# Patient Record
Sex: Female | Born: 1940 | Race: Black or African American | Hispanic: No | Smoking: Former smoker
Health system: Southern US, Community
[De-identification: ages and names within clinical notes are randomized; demographics above are authoritative.]

## PROBLEM LIST (undated history)

## (undated) DIAGNOSIS — D509 Iron deficiency anemia, unspecified: Secondary | ICD-10-CM

## (undated) DIAGNOSIS — I10 Essential (primary) hypertension: Secondary | ICD-10-CM

## (undated) DIAGNOSIS — R0789 Other chest pain: Secondary | ICD-10-CM

## (undated) DIAGNOSIS — R0989 Other specified symptoms and signs involving the circulatory and respiratory systems: Secondary | ICD-10-CM

## (undated) DIAGNOSIS — E785 Hyperlipidemia, unspecified: Secondary | ICD-10-CM

## (undated) DIAGNOSIS — R42 Dizziness and giddiness: Secondary | ICD-10-CM

## (undated) DIAGNOSIS — Z9289 Personal history of other medical treatment: Secondary | ICD-10-CM

## (undated) HISTORY — PX: ABDOMINAL HYSTERECTOMY: SHX81

## (undated) HISTORY — DX: Other specified symptoms and signs involving the circulatory and respiratory systems: R09.89

## (undated) HISTORY — PX: GASTRIC RESTRICTION SURGERY: SHX653

## (undated) HISTORY — PX: SHOULDER SURGERY: SHX246

## (undated) HISTORY — PX: TUBAL LIGATION: SHX77

## (undated) HISTORY — DX: Other chest pain: R07.89

## (undated) HISTORY — DX: Essential (primary) hypertension: I10

## (undated) HISTORY — PX: TONSILLECTOMY: SUR1361

## (undated) HISTORY — DX: Hyperlipidemia, unspecified: E78.5

## (undated) HISTORY — DX: Dizziness and giddiness: R42

---

## 2003-06-10 ENCOUNTER — Inpatient Hospital Stay (HOSPITAL_COMMUNITY): Admission: RE | Admit: 2003-06-10 | Discharge: 2003-06-11 | Payer: Self-pay | Admitting: Orthopedic Surgery

## 2010-05-16 ENCOUNTER — Encounter (INDEPENDENT_AMBULATORY_CARE_PROVIDER_SITE_OTHER): Payer: Self-pay | Admitting: Surgery

## 2010-05-16 ENCOUNTER — Inpatient Hospital Stay (HOSPITAL_COMMUNITY)
Admission: RE | Admit: 2010-05-16 | Discharge: 2010-05-23 | Payer: Self-pay | Source: Home / Self Care | Attending: Surgery | Admitting: Surgery

## 2010-08-20 LAB — COMPREHENSIVE METABOLIC PANEL
ALT: 11 U/L (ref 0–35)
ALT: 11 U/L (ref 0–35)
ALT: 12 U/L (ref 0–35)
ALT: 13 U/L (ref 0–35)
AST: 15 U/L (ref 0–37)
AST: 18 U/L (ref 0–37)
AST: 19 U/L (ref 0–37)
AST: 19 U/L (ref 0–37)
Albumin: 2.6 g/dL — ABNORMAL LOW (ref 3.5–5.2)
Albumin: 2.7 g/dL — ABNORMAL LOW (ref 3.5–5.2)
Albumin: 2.8 g/dL — ABNORMAL LOW (ref 3.5–5.2)
Albumin: 3.4 g/dL — ABNORMAL LOW (ref 3.5–5.2)
Alkaline Phosphatase: 41 U/L (ref 39–117)
Alkaline Phosphatase: 43 U/L (ref 39–117)
Alkaline Phosphatase: 47 U/L (ref 39–117)
Alkaline Phosphatase: 58 U/L (ref 39–117)
BUN: 2 mg/dL — ABNORMAL LOW (ref 6–23)
BUN: 7 mg/dL (ref 6–23)
BUN: 8 mg/dL (ref 6–23)
BUN: 8 mg/dL (ref 6–23)
CO2: 29 mEq/L (ref 19–32)
CO2: 30 mEq/L (ref 19–32)
CO2: 33 mEq/L — ABNORMAL HIGH (ref 19–32)
CO2: 35 mEq/L — ABNORMAL HIGH (ref 19–32)
Calcium: 8.4 mg/dL (ref 8.4–10.5)
Calcium: 8.6 mg/dL (ref 8.4–10.5)
Calcium: 8.8 mg/dL (ref 8.4–10.5)
Calcium: 9.2 mg/dL (ref 8.4–10.5)
Chloride: 100 mEq/L (ref 96–112)
Chloride: 104 mEq/L (ref 96–112)
Chloride: 104 mEq/L (ref 96–112)
Chloride: 97 mEq/L (ref 96–112)
Creatinine, Ser: 0.79 mg/dL (ref 0.4–1.2)
Creatinine, Ser: 0.81 mg/dL (ref 0.4–1.2)
Creatinine, Ser: 0.94 mg/dL (ref 0.4–1.2)
Creatinine, Ser: 0.96 mg/dL (ref 0.4–1.2)
GFR calc Af Amer: >60 mL/min (ref >60)
GFR calc Af Amer: >60 mL/min (ref >60)
GFR calc Af Amer: >60 mL/min (ref >60)
GFR calc Af Amer: >60 mL/min (ref >60)
GFR calc non Af Amer: 58 mL/min — ABNORMAL LOW (ref >60)
GFR calc non Af Amer: 59 mL/min — ABNORMAL LOW (ref >60)
GFR calc non Af Amer: >60 mL/min (ref >60)
GFR calc non Af Amer: >60 mL/min (ref >60)
Glucose, Bld: 136 mg/dL — ABNORMAL HIGH (ref 70–99)
Glucose, Bld: 146 mg/dL — ABNORMAL HIGH (ref 70–99)
Glucose, Bld: 156 mg/dL — ABNORMAL HIGH (ref 70–99)
Glucose, Bld: 96 mg/dL (ref 70–99)
Potassium: 3.7 mEq/L (ref 3.5–5.1)
Potassium: 4.2 mEq/L (ref 3.5–5.1)
Potassium: 4.3 mEq/L (ref 3.5–5.1)
Potassium: 4.5 mEq/L (ref 3.5–5.1)
Sodium: 138 mEq/L (ref 135–145)
Sodium: 140 mEq/L (ref 135–145)
Sodium: 140 mEq/L (ref 135–145)
Sodium: 141 mEq/L (ref 135–145)
Total Bilirubin: 0.5 mg/dL (ref 0.3–1.2)
Total Bilirubin: 0.5 mg/dL (ref 0.3–1.2)
Total Bilirubin: 0.5 mg/dL (ref 0.3–1.2)
Total Bilirubin: 0.8 mg/dL (ref 0.3–1.2)
Total Protein: 5.7 g/dL — ABNORMAL LOW (ref 6.0–8.3)
Total Protein: 5.8 g/dL — ABNORMAL LOW (ref 6.0–8.3)
Total Protein: 5.8 g/dL — ABNORMAL LOW (ref 6.0–8.3)
Total Protein: 6.9 g/dL (ref 6.0–8.3)

## 2010-08-20 LAB — DIFFERENTIAL
Basophils Absolute: 0 10*3/uL (ref 0.0–0.1)
Basophils Relative: 0 % (ref 0–1)
Eosinophils Absolute: 0.1 10*3/uL (ref 0.0–0.7)
Eosinophils Relative: 2 % (ref 0–5)
Lymphocytes Relative: 40 % (ref 12–46)
Lymphs Abs: 2.9 10*3/uL (ref 0.7–4.0)
Monocytes Absolute: 0.7 10*3/uL (ref 0.1–1.0)
Monocytes Relative: 9 % (ref 3–12)
Neutro Abs: 3.5 10*3/uL (ref 1.7–7.7)
Neutrophils Relative %: 49 % (ref 43–77)

## 2010-08-20 LAB — CBC
HCT: 27.4 % — ABNORMAL LOW (ref 36.0–46.0)
HCT: 28 % — ABNORMAL LOW (ref 36.0–46.0)
HCT: 29.1 % — ABNORMAL LOW (ref 36.0–46.0)
HCT: 30.6 % — ABNORMAL LOW (ref 36.0–46.0)
HCT: 34.8 % — ABNORMAL LOW (ref 36.0–46.0)
Hemoglobin: 11 g/dL — ABNORMAL LOW (ref 12.0–15.0)
Hemoglobin: 8.4 g/dL — ABNORMAL LOW (ref 12.0–15.0)
Hemoglobin: 8.4 g/dL — ABNORMAL LOW (ref 12.0–15.0)
Hemoglobin: 8.8 g/dL — ABNORMAL LOW (ref 12.0–15.0)
Hemoglobin: 9.4 g/dL — ABNORMAL LOW (ref 12.0–15.0)
MCH: 26.9 pg (ref 26.0–34.0)
MCH: 27 pg (ref 26.0–34.0)
MCH: 27.1 pg (ref 26.0–34.0)
MCH: 27.1 pg (ref 26.0–34.0)
MCH: 27.4 pg (ref 26.0–34.0)
MCHC: 30 g/dL (ref 30.0–36.0)
MCHC: 30.2 g/dL (ref 30.0–36.0)
MCHC: 30.7 g/dL (ref 30.0–36.0)
MCHC: 30.7 g/dL (ref 30.0–36.0)
MCHC: 31.6 g/dL (ref 30.0–36.0)
MCV: 85.7 fL (ref 78.0–100.0)
MCV: 87.4 fL (ref 78.0–100.0)
MCV: 88.4 fL (ref 78.0–100.0)
MCV: 90 fL (ref 78.0–100.0)
MCV: 90.7 fL (ref 78.0–100.0)
Platelets: 241 10*3/uL (ref 150–400)
Platelets: 245 10*3/uL (ref 150–400)
Platelets: 257 10*3/uL (ref 150–400)
Platelets: 306 10*3/uL (ref 150–400)
Platelets: 326 10*3/uL (ref 150–400)
RBC: 3.1 MIL/uL — ABNORMAL LOW (ref 3.87–5.11)
RBC: 3.11 MIL/uL — ABNORMAL LOW (ref 3.87–5.11)
RBC: 3.21 MIL/uL — ABNORMAL LOW (ref 3.87–5.11)
RBC: 3.5 MIL/uL — ABNORMAL LOW (ref 3.87–5.11)
RBC: 4.06 MIL/uL (ref 3.87–5.11)
RDW: 13.3 % (ref 11.5–15.5)
RDW: 13.4 % (ref 11.5–15.5)
RDW: 13.5 % (ref 11.5–15.5)
RDW: 13.5 % (ref 11.5–15.5)
RDW: 13.7 % (ref 11.5–15.5)
WBC: 10.6 10*3/uL — ABNORMAL HIGH (ref 4.0–10.5)
WBC: 11.6 10*3/uL — ABNORMAL HIGH (ref 4.0–10.5)
WBC: 15.6 10*3/uL — ABNORMAL HIGH (ref 4.0–10.5)
WBC: 7.3 10*3/uL (ref 4.0–10.5)
WBC: 7.6 10*3/uL (ref 4.0–10.5)

## 2010-08-20 LAB — BASIC METABOLIC PANEL
BUN: 3 mg/dL — ABNORMAL LOW (ref 6–23)
CO2: 35 mEq/L — ABNORMAL HIGH (ref 19–32)
Calcium: 8.9 mg/dL (ref 8.4–10.5)
Chloride: 101 mEq/L (ref 96–112)
Creatinine, Ser: 0.92 mg/dL (ref 0.4–1.2)
GFR calc Af Amer: >60 mL/min (ref >60)
GFR calc non Af Amer: >60 mL/min (ref >60)
Glucose, Bld: 147 mg/dL — ABNORMAL HIGH (ref 70–99)
Potassium: 3.4 mEq/L — ABNORMAL LOW (ref 3.5–5.1)
Sodium: 143 mEq/L (ref 135–145)

## 2010-08-20 LAB — SURGICAL PCR SCREEN
MRSA, PCR: NEGATIVE
Staphylococcus aureus: NEGATIVE

## 2010-10-26 NOTE — Op Note (Signed)
NAMEMarland Kitchen  BAYLEN, DEA                        ACCOUNT NO.:  [id number]   MEDICAL RECORD NO.:  [id number]                   PATIENT TYPE:  AMB   LOCATION:  DAY                                  FACILITY:  [facility]   PHYSICIAN:  Ollen Gross, M.D.                 DATE OF BIRTH:  July 30, 1940   DATE OF PROCEDURE:  06/09/2003  DATE OF DISCHARGE:                                 OPERATIVE REPORT   PREOPERATIVE DIAGNOSIS:  Right shoulder impingement, acromioclavicular  arthrosis versus rotator cuff tear.   POSTOPERATIVE DIAGNOSIS:  Right shoulder impingement, acromioclavicular  arthrosis versus rotator cuff tear.   OPERATION/PROCEDURE:  1. Right shoulder open subacromial decompression.  2. Distal clavicle resection.  3. Rotator cuff repair.   SURGEON:  Ollen Gross, M.D.   ASSISTANT:  Alexzandrew L. Perkins, P.A.-C.   ANESTHESIA:  Interscalene and general.   ESTIMATED BLOOD LOSS:  Minimal.   DRAINS:  None.   COMPLICATIONS:  None.   DISPOSITION:  Condition stable to recovery.   BRIEF CLINICAL NOTE:  Ashley Ritter is a 70 year old female with a two to  three month history of progressively worsening right shoulder pain and  dysfunction.  Exam and history are consistent with a rotator cuff tear  confirmed by MRI.  She presents now for the above-mentioned procedure.   DESCRIPTION OF PROCEDURE:  After successful administration of interscalene  block and general anesthetic, the patient's right upper extremity and  shoulder girdle isolated from her trunk with plastic drapes and prepped and  draped in the usual sterile fashion.  Bony landmarks are marked and incision  line drawn along the skin lines at the mid acromial level from anterior to  posterior, about a 4 cm incision.  Skin was cut with #10 blade through the  subcutaneous tissue to the level of the deltoid fascia.  Subcutaneous flaps  are elevated.  The periosteum of the distal clavicle was then split  longitudinally and elevated  anterior and posterior.  We carried that out  laterally, across the anterior border of the acromion and then also split  between deltoid fibers, and into the mid deltoid for about 2 cm.  The entire  anterior soft tissue sleeve was subperiosteally elevated.  On the clavicle,  we did posterior elevation but not otherwise.  The [facility] joint was identified  and it is very stenotic with large spurs.  Anterior and posterior retractors  were placed  against the distal clavicle and about 1 cm of distal clavicle  is removed.  This effectively decompressed that area.  She had a type 2  acromion and then the oscillating saw was used to do the acromioplasty and  created a flat undersurface of the acromion.  She had a highly inflamed  bursa and all the bursa was removed.  There is a massive rotator cuff tear  involving supraspinatus, infraspinatus and ___________.  She had a  tremendous amount of  adhesions between the bursa and the rotator cuff.  I  removed the intractable _________ adhesions.  I was able to advance to the  great tuberosity up to about 60 degrees.  We created a trough at the level  of the tuberosity, placed two Myotec anchors and put the suture into the  free edges of the tendon and advanced it down through the trough.  It was  then oversewn and it was found to be a nice stable repair.  I was able to  bring her arm back down to her side and it did not disrupt the repair.  Once  this was completed, we irrigated the subacromial space with saline and  reattached the deltoid to the acromion through drill holes with #1 Ethibond.  The fascia where the distal clavicle was also closed and imbricated with the  Ethibond, the deltoid splits closed with interrupted 2-0 Vicryl.  Subcutaneous tissue closed with interrupted 2-0 Vicryl and subcuticular  running 4-0 Monocryl.  The patient was cleaned and dried and Steri-Strips  and a bulky sterile dressing applied.  She was placed into an abduction  sling,  awakened and transported to recovery in stable condition.                                               Ollen Gross, M.D.    FA/MEDQ  D:  06/09/2003  T:  06/09/2003  Job:  [id number]

## 2011-08-30 DIAGNOSIS — I10 Essential (primary) hypertension: Secondary | ICD-10-CM | POA: Diagnosis not present

## 2011-08-30 DIAGNOSIS — Z23 Encounter for immunization: Secondary | ICD-10-CM | POA: Diagnosis not present

## 2011-08-30 DIAGNOSIS — E782 Mixed hyperlipidemia: Secondary | ICD-10-CM | POA: Diagnosis not present

## 2011-08-30 DIAGNOSIS — Z0000 Encounter for general adult medical examination without abnormal findings: Secondary | ICD-10-CM | POA: Diagnosis not present

## 2011-10-03 DIAGNOSIS — K649 Unspecified hemorrhoids: Secondary | ICD-10-CM | POA: Diagnosis not present

## 2012-03-06 DIAGNOSIS — I10 Essential (primary) hypertension: Secondary | ICD-10-CM | POA: Diagnosis not present

## 2012-03-06 DIAGNOSIS — E8801 Alpha-1-antitrypsin deficiency: Secondary | ICD-10-CM | POA: Diagnosis not present

## 2012-03-06 DIAGNOSIS — R7309 Other abnormal glucose: Secondary | ICD-10-CM | POA: Diagnosis not present

## 2012-03-06 DIAGNOSIS — E785 Hyperlipidemia, unspecified: Secondary | ICD-10-CM | POA: Diagnosis not present

## 2012-08-04 DIAGNOSIS — L84 Corns and callosities: Secondary | ICD-10-CM | POA: Diagnosis not present

## 2012-10-23 DIAGNOSIS — R7309 Other abnormal glucose: Secondary | ICD-10-CM | POA: Diagnosis not present

## 2012-10-23 DIAGNOSIS — I10 Essential (primary) hypertension: Secondary | ICD-10-CM | POA: Diagnosis not present

## 2012-10-23 DIAGNOSIS — E8801 Alpha-1-antitrypsin deficiency: Secondary | ICD-10-CM | POA: Diagnosis not present

## 2012-10-23 DIAGNOSIS — N390 Urinary tract infection, site not specified: Secondary | ICD-10-CM | POA: Diagnosis not present

## 2012-10-23 DIAGNOSIS — E785 Hyperlipidemia, unspecified: Secondary | ICD-10-CM | POA: Diagnosis not present

## 2012-11-13 DIAGNOSIS — Z1231 Encounter for screening mammogram for malignant neoplasm of breast: Secondary | ICD-10-CM | POA: Diagnosis not present

## 2012-12-18 DIAGNOSIS — K439 Ventral hernia without obstruction or gangrene: Secondary | ICD-10-CM | POA: Diagnosis not present

## 2012-12-18 DIAGNOSIS — R42 Dizziness and giddiness: Secondary | ICD-10-CM | POA: Diagnosis not present

## 2012-12-18 DIAGNOSIS — R319 Hematuria, unspecified: Secondary | ICD-10-CM | POA: Diagnosis not present

## 2012-12-18 DIAGNOSIS — K5730 Diverticulosis of large intestine without perforation or abscess without bleeding: Secondary | ICD-10-CM | POA: Diagnosis not present

## 2012-12-18 DIAGNOSIS — N200 Calculus of kidney: Secondary | ICD-10-CM | POA: Diagnosis not present

## 2013-01-01 DIAGNOSIS — R319 Hematuria, unspecified: Secondary | ICD-10-CM | POA: Diagnosis not present

## 2013-02-18 DIAGNOSIS — H2589 Other age-related cataract: Secondary | ICD-10-CM | POA: Diagnosis not present

## 2013-02-19 DIAGNOSIS — R7309 Other abnormal glucose: Secondary | ICD-10-CM | POA: Diagnosis not present

## 2013-02-19 DIAGNOSIS — E785 Hyperlipidemia, unspecified: Secondary | ICD-10-CM | POA: Diagnosis not present

## 2013-02-19 DIAGNOSIS — I10 Essential (primary) hypertension: Secondary | ICD-10-CM | POA: Diagnosis not present

## 2013-04-15 DIAGNOSIS — K921 Melena: Secondary | ICD-10-CM | POA: Diagnosis not present

## 2013-04-15 DIAGNOSIS — Z86010 Personal history of colonic polyps: Secondary | ICD-10-CM | POA: Diagnosis not present

## 2013-04-15 DIAGNOSIS — K219 Gastro-esophageal reflux disease without esophagitis: Secondary | ICD-10-CM | POA: Diagnosis not present

## 2013-05-13 DIAGNOSIS — K644 Residual hemorrhoidal skin tags: Secondary | ICD-10-CM | POA: Diagnosis not present

## 2013-05-13 DIAGNOSIS — K648 Other hemorrhoids: Secondary | ICD-10-CM | POA: Diagnosis not present

## 2013-06-25 DIAGNOSIS — I10 Essential (primary) hypertension: Secondary | ICD-10-CM | POA: Diagnosis not present

## 2013-06-25 DIAGNOSIS — R7309 Other abnormal glucose: Secondary | ICD-10-CM | POA: Diagnosis not present

## 2013-06-25 DIAGNOSIS — E785 Hyperlipidemia, unspecified: Secondary | ICD-10-CM | POA: Diagnosis not present

## 2013-06-25 DIAGNOSIS — E8801 Alpha-1-antitrypsin deficiency: Secondary | ICD-10-CM | POA: Diagnosis not present

## 2013-07-07 DIAGNOSIS — R319 Hematuria, unspecified: Secondary | ICD-10-CM | POA: Diagnosis not present

## 2013-07-07 DIAGNOSIS — Z0000 Encounter for general adult medical examination without abnormal findings: Secondary | ICD-10-CM | POA: Diagnosis not present

## 2013-07-30 DIAGNOSIS — I10 Essential (primary) hypertension: Secondary | ICD-10-CM | POA: Diagnosis not present

## 2013-07-30 DIAGNOSIS — R0789 Other chest pain: Secondary | ICD-10-CM | POA: Diagnosis not present

## 2013-07-30 DIAGNOSIS — E785 Hyperlipidemia, unspecified: Secondary | ICD-10-CM | POA: Diagnosis not present

## 2013-07-30 DIAGNOSIS — R0989 Other specified symptoms and signs involving the circulatory and respiratory systems: Secondary | ICD-10-CM | POA: Diagnosis not present

## 2013-08-24 DIAGNOSIS — R0989 Other specified symptoms and signs involving the circulatory and respiratory systems: Secondary | ICD-10-CM | POA: Diagnosis not present

## 2013-08-26 DIAGNOSIS — R0789 Other chest pain: Secondary | ICD-10-CM | POA: Diagnosis not present

## 2013-08-26 DIAGNOSIS — R079 Chest pain, unspecified: Secondary | ICD-10-CM | POA: Diagnosis not present

## 2013-08-30 DIAGNOSIS — H40019 Open angle with borderline findings, low risk, unspecified eye: Secondary | ICD-10-CM | POA: Diagnosis not present

## 2013-08-30 DIAGNOSIS — H2589 Other age-related cataract: Secondary | ICD-10-CM | POA: Diagnosis not present

## 2013-09-02 DIAGNOSIS — I10 Essential (primary) hypertension: Secondary | ICD-10-CM | POA: Diagnosis not present

## 2013-09-02 DIAGNOSIS — R0989 Other specified symptoms and signs involving the circulatory and respiratory systems: Secondary | ICD-10-CM | POA: Diagnosis not present

## 2013-09-02 DIAGNOSIS — E785 Hyperlipidemia, unspecified: Secondary | ICD-10-CM | POA: Diagnosis not present

## 2013-09-24 DIAGNOSIS — R079 Chest pain, unspecified: Secondary | ICD-10-CM | POA: Diagnosis not present

## 2013-11-29 DIAGNOSIS — H40019 Open angle with borderline findings, low risk, unspecified eye: Secondary | ICD-10-CM | POA: Diagnosis not present

## 2014-02-04 DIAGNOSIS — I10 Essential (primary) hypertension: Secondary | ICD-10-CM | POA: Diagnosis not present

## 2014-02-04 DIAGNOSIS — D6489 Other specified anemias: Secondary | ICD-10-CM | POA: Diagnosis not present

## 2014-02-04 DIAGNOSIS — R7309 Other abnormal glucose: Secondary | ICD-10-CM | POA: Diagnosis not present

## 2014-02-04 DIAGNOSIS — E785 Hyperlipidemia, unspecified: Secondary | ICD-10-CM | POA: Diagnosis not present

## 2014-02-04 DIAGNOSIS — I158 Other secondary hypertension: Secondary | ICD-10-CM | POA: Diagnosis not present

## 2014-02-04 DIAGNOSIS — L02419 Cutaneous abscess of limb, unspecified: Secondary | ICD-10-CM | POA: Diagnosis not present

## 2014-02-04 DIAGNOSIS — Z6837 Body mass index (BMI) 37.0-37.9, adult: Secondary | ICD-10-CM | POA: Diagnosis not present

## 2014-02-04 DIAGNOSIS — I519 Heart disease, unspecified: Secondary | ICD-10-CM | POA: Diagnosis not present

## 2014-02-04 DIAGNOSIS — R7989 Other specified abnormal findings of blood chemistry: Secondary | ICD-10-CM | POA: Diagnosis not present

## 2014-02-04 DIAGNOSIS — L03119 Cellulitis of unspecified part of limb: Secondary | ICD-10-CM | POA: Diagnosis not present

## 2014-04-15 DIAGNOSIS — Z9889 Other specified postprocedural states: Secondary | ICD-10-CM | POA: Diagnosis not present

## 2014-04-15 DIAGNOSIS — Z1211 Encounter for screening for malignant neoplasm of colon: Secondary | ICD-10-CM | POA: Diagnosis not present

## 2014-04-15 DIAGNOSIS — Z86010 Personal history of colonic polyps: Secondary | ICD-10-CM | POA: Diagnosis not present

## 2014-04-28 DIAGNOSIS — L02234 Carbuncle of groin: Secondary | ICD-10-CM | POA: Diagnosis not present

## 2014-04-28 DIAGNOSIS — B373 Candidiasis of vulva and vagina: Secondary | ICD-10-CM | POA: Diagnosis not present

## 2014-04-28 DIAGNOSIS — Z01411 Encounter for gynecological examination (general) (routine) with abnormal findings: Secondary | ICD-10-CM | POA: Diagnosis not present

## 2014-04-28 DIAGNOSIS — R300 Dysuria: Secondary | ICD-10-CM | POA: Diagnosis not present

## 2014-04-28 DIAGNOSIS — N390 Urinary tract infection, site not specified: Secondary | ICD-10-CM | POA: Diagnosis not present

## 2014-06-17 DIAGNOSIS — E785 Hyperlipidemia, unspecified: Secondary | ICD-10-CM | POA: Diagnosis not present

## 2014-06-17 DIAGNOSIS — N390 Urinary tract infection, site not specified: Secondary | ICD-10-CM | POA: Diagnosis not present

## 2014-06-17 DIAGNOSIS — E8801 Alpha-1-antitrypsin deficiency: Secondary | ICD-10-CM | POA: Diagnosis not present

## 2014-06-17 DIAGNOSIS — I519 Heart disease, unspecified: Secondary | ICD-10-CM | POA: Diagnosis not present

## 2014-06-17 DIAGNOSIS — R7309 Other abnormal glucose: Secondary | ICD-10-CM | POA: Diagnosis not present

## 2014-06-17 DIAGNOSIS — I10 Essential (primary) hypertension: Secondary | ICD-10-CM | POA: Diagnosis not present

## 2014-06-17 DIAGNOSIS — N183 Chronic kidney disease, stage 3 (moderate): Secondary | ICD-10-CM | POA: Diagnosis not present

## 2014-06-23 DIAGNOSIS — E669 Obesity, unspecified: Secondary | ICD-10-CM | POA: Diagnosis not present

## 2014-06-23 DIAGNOSIS — Z9049 Acquired absence of other specified parts of digestive tract: Secondary | ICD-10-CM | POA: Diagnosis not present

## 2014-06-23 DIAGNOSIS — K644 Residual hemorrhoidal skin tags: Secondary | ICD-10-CM | POA: Diagnosis not present

## 2014-06-23 DIAGNOSIS — I10 Essential (primary) hypertension: Secondary | ICD-10-CM | POA: Diagnosis not present

## 2014-06-23 DIAGNOSIS — K5730 Diverticulosis of large intestine without perforation or abscess without bleeding: Secondary | ICD-10-CM | POA: Diagnosis not present

## 2014-06-23 DIAGNOSIS — K648 Other hemorrhoids: Secondary | ICD-10-CM | POA: Diagnosis not present

## 2014-06-23 DIAGNOSIS — Z1211 Encounter for screening for malignant neoplasm of colon: Secondary | ICD-10-CM | POA: Diagnosis not present

## 2014-06-23 DIAGNOSIS — Z6834 Body mass index (BMI) 34.0-34.9, adult: Secondary | ICD-10-CM | POA: Diagnosis not present

## 2014-06-23 DIAGNOSIS — K621 Rectal polyp: Secondary | ICD-10-CM | POA: Diagnosis not present

## 2014-06-23 DIAGNOSIS — K529 Noninfective gastroenteritis and colitis, unspecified: Secondary | ICD-10-CM | POA: Diagnosis not present

## 2014-06-23 DIAGNOSIS — Z86010 Personal history of colonic polyps: Secondary | ICD-10-CM | POA: Diagnosis not present

## 2014-06-23 DIAGNOSIS — K649 Unspecified hemorrhoids: Secondary | ICD-10-CM | POA: Diagnosis not present

## 2014-08-09 DIAGNOSIS — L02214 Cutaneous abscess of groin: Secondary | ICD-10-CM | POA: Diagnosis not present

## 2014-08-09 DIAGNOSIS — N390 Urinary tract infection, site not specified: Secondary | ICD-10-CM | POA: Diagnosis not present

## 2014-08-09 DIAGNOSIS — J019 Acute sinusitis, unspecified: Secondary | ICD-10-CM | POA: Diagnosis not present

## 2014-08-09 DIAGNOSIS — Z6835 Body mass index (BMI) 35.0-35.9, adult: Secondary | ICD-10-CM | POA: Diagnosis not present

## 2014-09-08 DIAGNOSIS — J180 Bronchopneumonia, unspecified organism: Secondary | ICD-10-CM | POA: Diagnosis not present

## 2014-09-08 DIAGNOSIS — N390 Urinary tract infection, site not specified: Secondary | ICD-10-CM | POA: Diagnosis not present

## 2014-10-18 DIAGNOSIS — K648 Other hemorrhoids: Secondary | ICD-10-CM | POA: Diagnosis not present

## 2014-10-24 DIAGNOSIS — N390 Urinary tract infection, site not specified: Secondary | ICD-10-CM | POA: Diagnosis not present

## 2014-10-24 DIAGNOSIS — Z6831 Body mass index (BMI) 31.0-31.9, adult: Secondary | ICD-10-CM | POA: Diagnosis not present

## 2014-10-28 DIAGNOSIS — K8020 Calculus of gallbladder without cholecystitis without obstruction: Secondary | ICD-10-CM | POA: Diagnosis not present

## 2014-10-28 DIAGNOSIS — K8080 Other cholelithiasis without obstruction: Secondary | ICD-10-CM | POA: Diagnosis not present

## 2014-10-28 DIAGNOSIS — J9811 Atelectasis: Secondary | ICD-10-CM | POA: Diagnosis not present

## 2014-11-10 ENCOUNTER — Other Ambulatory Visit: Payer: Self-pay

## 2014-11-10 DIAGNOSIS — K2960 Other gastritis without bleeding: Secondary | ICD-10-CM | POA: Diagnosis not present

## 2014-11-10 DIAGNOSIS — D649 Anemia, unspecified: Secondary | ICD-10-CM | POA: Diagnosis not present

## 2014-11-10 DIAGNOSIS — R100 Acute abdomen: Secondary | ICD-10-CM | POA: Diagnosis not present

## 2014-11-10 DIAGNOSIS — K2900 Acute gastritis without bleeding: Secondary | ICD-10-CM | POA: Diagnosis not present

## 2014-11-10 DIAGNOSIS — K219 Gastro-esophageal reflux disease without esophagitis: Secondary | ICD-10-CM | POA: Diagnosis not present

## 2014-11-11 DIAGNOSIS — N200 Calculus of kidney: Secondary | ICD-10-CM | POA: Diagnosis not present

## 2014-11-11 DIAGNOSIS — K469 Unspecified abdominal hernia without obstruction or gangrene: Secondary | ICD-10-CM | POA: Diagnosis not present

## 2014-11-11 DIAGNOSIS — D649 Anemia, unspecified: Secondary | ICD-10-CM | POA: Diagnosis not present

## 2014-11-11 DIAGNOSIS — K219 Gastro-esophageal reflux disease without esophagitis: Secondary | ICD-10-CM | POA: Diagnosis not present

## 2014-11-11 DIAGNOSIS — K8020 Calculus of gallbladder without cholecystitis without obstruction: Secondary | ICD-10-CM | POA: Diagnosis not present

## 2014-11-11 DIAGNOSIS — R100 Acute abdomen: Secondary | ICD-10-CM | POA: Diagnosis not present

## 2014-11-11 DIAGNOSIS — K5730 Diverticulosis of large intestine without perforation or abscess without bleeding: Secondary | ICD-10-CM | POA: Diagnosis not present

## 2014-12-26 DIAGNOSIS — E780 Pure hypercholesterolemia: Secondary | ICD-10-CM | POA: Diagnosis not present

## 2014-12-26 DIAGNOSIS — D509 Iron deficiency anemia, unspecified: Secondary | ICD-10-CM | POA: Diagnosis not present

## 2014-12-26 DIAGNOSIS — I10 Essential (primary) hypertension: Secondary | ICD-10-CM | POA: Diagnosis not present

## 2014-12-26 DIAGNOSIS — K921 Melena: Secondary | ICD-10-CM | POA: Diagnosis not present

## 2014-12-26 DIAGNOSIS — Z79899 Other long term (current) drug therapy: Secondary | ICD-10-CM | POA: Diagnosis not present

## 2014-12-27 DIAGNOSIS — I10 Essential (primary) hypertension: Secondary | ICD-10-CM | POA: Insufficient documentation

## 2014-12-27 DIAGNOSIS — I129 Hypertensive chronic kidney disease with stage 1 through stage 4 chronic kidney disease, or unspecified chronic kidney disease: Secondary | ICD-10-CM | POA: Insufficient documentation

## 2014-12-27 DIAGNOSIS — E785 Hyperlipidemia, unspecified: Secondary | ICD-10-CM | POA: Insufficient documentation

## 2014-12-27 DIAGNOSIS — R0789 Other chest pain: Secondary | ICD-10-CM | POA: Diagnosis not present

## 2014-12-27 HISTORY — DX: Hyperlipidemia, unspecified: E78.5

## 2014-12-27 HISTORY — DX: Essential (primary) hypertension: I10

## 2014-12-28 DIAGNOSIS — D509 Iron deficiency anemia, unspecified: Secondary | ICD-10-CM | POA: Diagnosis not present

## 2014-12-30 DIAGNOSIS — Z6832 Body mass index (BMI) 32.0-32.9, adult: Secondary | ICD-10-CM | POA: Diagnosis not present

## 2014-12-30 DIAGNOSIS — I776 Arteritis, unspecified: Secondary | ICD-10-CM | POA: Diagnosis not present

## 2015-01-02 DIAGNOSIS — H40003 Preglaucoma, unspecified, bilateral: Secondary | ICD-10-CM | POA: Diagnosis not present

## 2015-01-04 DIAGNOSIS — D509 Iron deficiency anemia, unspecified: Secondary | ICD-10-CM | POA: Diagnosis not present

## 2015-01-10 DIAGNOSIS — I519 Heart disease, unspecified: Secondary | ICD-10-CM | POA: Diagnosis not present

## 2015-01-10 DIAGNOSIS — I6523 Occlusion and stenosis of bilateral carotid arteries: Secondary | ICD-10-CM | POA: Diagnosis not present

## 2015-01-10 DIAGNOSIS — M542 Cervicalgia: Secondary | ICD-10-CM | POA: Diagnosis not present

## 2015-02-23 DIAGNOSIS — D649 Anemia, unspecified: Secondary | ICD-10-CM | POA: Diagnosis not present

## 2015-02-23 DIAGNOSIS — K648 Other hemorrhoids: Secondary | ICD-10-CM | POA: Diagnosis not present

## 2015-02-28 DIAGNOSIS — E785 Hyperlipidemia, unspecified: Secondary | ICD-10-CM | POA: Diagnosis not present

## 2015-02-28 DIAGNOSIS — R0789 Other chest pain: Secondary | ICD-10-CM | POA: Diagnosis not present

## 2015-02-28 DIAGNOSIS — I10 Essential (primary) hypertension: Secondary | ICD-10-CM | POA: Diagnosis not present

## 2015-03-06 DIAGNOSIS — H40003 Preglaucoma, unspecified, bilateral: Secondary | ICD-10-CM | POA: Diagnosis not present

## 2015-03-07 DIAGNOSIS — D649 Anemia, unspecified: Secondary | ICD-10-CM | POA: Diagnosis not present

## 2015-03-08 DIAGNOSIS — D509 Iron deficiency anemia, unspecified: Secondary | ICD-10-CM | POA: Diagnosis not present

## 2015-03-13 DIAGNOSIS — I10 Essential (primary) hypertension: Secondary | ICD-10-CM | POA: Diagnosis not present

## 2015-03-13 DIAGNOSIS — R0602 Shortness of breath: Secondary | ICD-10-CM | POA: Diagnosis not present

## 2015-03-22 DIAGNOSIS — I10 Essential (primary) hypertension: Secondary | ICD-10-CM | POA: Diagnosis not present

## 2015-03-22 DIAGNOSIS — R7309 Other abnormal glucose: Secondary | ICD-10-CM | POA: Diagnosis not present

## 2015-03-22 DIAGNOSIS — E785 Hyperlipidemia, unspecified: Secondary | ICD-10-CM | POA: Diagnosis not present

## 2015-03-22 DIAGNOSIS — N390 Urinary tract infection, site not specified: Secondary | ICD-10-CM | POA: Diagnosis not present

## 2015-03-22 DIAGNOSIS — Z0000 Encounter for general adult medical examination without abnormal findings: Secondary | ICD-10-CM | POA: Diagnosis not present

## 2015-05-25 DIAGNOSIS — Z6833 Body mass index (BMI) 33.0-33.9, adult: Secondary | ICD-10-CM | POA: Diagnosis not present

## 2015-05-25 DIAGNOSIS — K648 Other hemorrhoids: Secondary | ICD-10-CM | POA: Diagnosis not present

## 2015-05-25 DIAGNOSIS — N823 Fistula of vagina to large intestine: Secondary | ICD-10-CM | POA: Diagnosis not present

## 2015-05-25 DIAGNOSIS — N950 Postmenopausal bleeding: Secondary | ICD-10-CM | POA: Diagnosis not present

## 2015-05-25 DIAGNOSIS — N390 Urinary tract infection, site not specified: Secondary | ICD-10-CM | POA: Diagnosis not present

## 2015-05-25 DIAGNOSIS — I129 Hypertensive chronic kidney disease with stage 1 through stage 4 chronic kidney disease, or unspecified chronic kidney disease: Secondary | ICD-10-CM | POA: Diagnosis not present

## 2015-06-28 DIAGNOSIS — Z1231 Encounter for screening mammogram for malignant neoplasm of breast: Secondary | ICD-10-CM | POA: Diagnosis not present

## 2015-07-03 DIAGNOSIS — H40003 Preglaucoma, unspecified, bilateral: Secondary | ICD-10-CM | POA: Diagnosis not present

## 2015-07-05 DIAGNOSIS — K625 Hemorrhage of anus and rectum: Secondary | ICD-10-CM | POA: Diagnosis not present

## 2015-07-07 DIAGNOSIS — D509 Iron deficiency anemia, unspecified: Secondary | ICD-10-CM | POA: Diagnosis not present

## 2015-07-26 DIAGNOSIS — E785 Hyperlipidemia, unspecified: Secondary | ICD-10-CM | POA: Diagnosis not present

## 2015-07-26 DIAGNOSIS — D509 Iron deficiency anemia, unspecified: Secondary | ICD-10-CM | POA: Diagnosis not present

## 2015-07-26 DIAGNOSIS — R7309 Other abnormal glucose: Secondary | ICD-10-CM | POA: Diagnosis not present

## 2015-07-26 DIAGNOSIS — I10 Essential (primary) hypertension: Secondary | ICD-10-CM | POA: Diagnosis not present

## 2015-07-26 DIAGNOSIS — Z6833 Body mass index (BMI) 33.0-33.9, adult: Secondary | ICD-10-CM | POA: Diagnosis not present

## 2015-07-26 DIAGNOSIS — N390 Urinary tract infection, site not specified: Secondary | ICD-10-CM | POA: Diagnosis not present

## 2015-07-26 DIAGNOSIS — I519 Heart disease, unspecified: Secondary | ICD-10-CM | POA: Diagnosis not present

## 2015-07-27 DIAGNOSIS — K648 Other hemorrhoids: Secondary | ICD-10-CM | POA: Diagnosis not present

## 2015-08-11 DIAGNOSIS — D509 Iron deficiency anemia, unspecified: Secondary | ICD-10-CM | POA: Diagnosis not present

## 2015-09-07 DIAGNOSIS — K648 Other hemorrhoids: Secondary | ICD-10-CM | POA: Diagnosis not present

## 2015-09-07 DIAGNOSIS — N390 Urinary tract infection, site not specified: Secondary | ICD-10-CM | POA: Diagnosis not present

## 2015-09-07 DIAGNOSIS — D649 Anemia, unspecified: Secondary | ICD-10-CM | POA: Diagnosis not present

## 2015-11-06 DIAGNOSIS — D509 Iron deficiency anemia, unspecified: Secondary | ICD-10-CM

## 2015-11-09 DIAGNOSIS — D509 Iron deficiency anemia, unspecified: Secondary | ICD-10-CM | POA: Diagnosis not present

## 2015-11-15 DIAGNOSIS — D509 Iron deficiency anemia, unspecified: Secondary | ICD-10-CM | POA: Diagnosis not present

## 2015-11-16 DIAGNOSIS — K610 Anal abscess: Secondary | ICD-10-CM | POA: Diagnosis not present

## 2015-11-27 DIAGNOSIS — K610 Anal abscess: Secondary | ICD-10-CM | POA: Diagnosis not present

## 2015-11-27 DIAGNOSIS — E669 Obesity, unspecified: Secondary | ICD-10-CM | POA: Diagnosis not present

## 2015-11-27 DIAGNOSIS — I10 Essential (primary) hypertension: Secondary | ICD-10-CM | POA: Diagnosis not present

## 2015-12-04 DIAGNOSIS — K601 Chronic anal fissure: Secondary | ICD-10-CM | POA: Diagnosis not present

## 2015-12-04 DIAGNOSIS — N823 Fistula of vagina to large intestine: Secondary | ICD-10-CM | POA: Diagnosis not present

## 2015-12-04 DIAGNOSIS — K603 Anal fistula: Secondary | ICD-10-CM | POA: Diagnosis not present

## 2015-12-04 DIAGNOSIS — Z6837 Body mass index (BMI) 37.0-37.9, adult: Secondary | ICD-10-CM | POA: Diagnosis not present

## 2015-12-04 DIAGNOSIS — Z86010 Personal history of colonic polyps: Secondary | ICD-10-CM | POA: Diagnosis not present

## 2015-12-04 DIAGNOSIS — K644 Residual hemorrhoidal skin tags: Secondary | ICD-10-CM | POA: Diagnosis not present

## 2015-12-04 DIAGNOSIS — K610 Anal abscess: Secondary | ICD-10-CM | POA: Diagnosis not present

## 2015-12-04 DIAGNOSIS — R32 Unspecified urinary incontinence: Secondary | ICD-10-CM | POA: Diagnosis not present

## 2015-12-04 DIAGNOSIS — I10 Essential (primary) hypertension: Secondary | ICD-10-CM | POA: Diagnosis not present

## 2015-12-04 DIAGNOSIS — Z90710 Acquired absence of both cervix and uterus: Secondary | ICD-10-CM | POA: Diagnosis not present

## 2015-12-04 DIAGNOSIS — M25551 Pain in right hip: Secondary | ICD-10-CM | POA: Diagnosis not present

## 2015-12-04 DIAGNOSIS — D649 Anemia, unspecified: Secondary | ICD-10-CM | POA: Diagnosis not present

## 2015-12-04 DIAGNOSIS — Z7982 Long term (current) use of aspirin: Secondary | ICD-10-CM | POA: Diagnosis not present

## 2015-12-04 DIAGNOSIS — K6289 Other specified diseases of anus and rectum: Secondary | ICD-10-CM | POA: Diagnosis not present

## 2015-12-04 DIAGNOSIS — Z880 Allergy status to penicillin: Secondary | ICD-10-CM | POA: Diagnosis not present

## 2015-12-04 DIAGNOSIS — K602 Anal fissure, unspecified: Secondary | ICD-10-CM | POA: Diagnosis not present

## 2015-12-04 DIAGNOSIS — Z87891 Personal history of nicotine dependence: Secondary | ICD-10-CM | POA: Diagnosis not present

## 2015-12-04 DIAGNOSIS — K624 Stenosis of anus and rectum: Secondary | ICD-10-CM | POA: Diagnosis not present

## 2015-12-04 DIAGNOSIS — E669 Obesity, unspecified: Secondary | ICD-10-CM | POA: Diagnosis not present

## 2015-12-18 DIAGNOSIS — Z1159 Encounter for screening for other viral diseases: Secondary | ICD-10-CM | POA: Diagnosis not present

## 2015-12-18 DIAGNOSIS — D500 Iron deficiency anemia secondary to blood loss (chronic): Secondary | ICD-10-CM | POA: Diagnosis not present

## 2015-12-18 DIAGNOSIS — K610 Anal abscess: Secondary | ICD-10-CM | POA: Diagnosis not present

## 2015-12-18 DIAGNOSIS — Z79899 Other long term (current) drug therapy: Secondary | ICD-10-CM | POA: Diagnosis not present

## 2015-12-21 DIAGNOSIS — Z6832 Body mass index (BMI) 32.0-32.9, adult: Secondary | ICD-10-CM | POA: Diagnosis not present

## 2015-12-21 DIAGNOSIS — H8110 Benign paroxysmal vertigo, unspecified ear: Secondary | ICD-10-CM | POA: Diagnosis not present

## 2016-01-08 DIAGNOSIS — D500 Iron deficiency anemia secondary to blood loss (chronic): Secondary | ICD-10-CM | POA: Diagnosis not present

## 2016-01-08 DIAGNOSIS — Z79899 Other long term (current) drug therapy: Secondary | ICD-10-CM | POA: Diagnosis not present

## 2016-01-08 DIAGNOSIS — K610 Anal abscess: Secondary | ICD-10-CM | POA: Diagnosis not present

## 2016-01-11 DIAGNOSIS — K610 Anal abscess: Secondary | ICD-10-CM | POA: Diagnosis not present

## 2016-01-15 DIAGNOSIS — D509 Iron deficiency anemia, unspecified: Secondary | ICD-10-CM | POA: Diagnosis not present

## 2016-01-16 DIAGNOSIS — D509 Iron deficiency anemia, unspecified: Secondary | ICD-10-CM | POA: Diagnosis not present

## 2016-02-06 ENCOUNTER — Other Ambulatory Visit: Payer: Self-pay

## 2016-02-22 DIAGNOSIS — K5090 Crohn's disease, unspecified, without complications: Secondary | ICD-10-CM | POA: Diagnosis not present

## 2016-03-04 DIAGNOSIS — R0789 Other chest pain: Secondary | ICD-10-CM | POA: Diagnosis not present

## 2016-03-04 DIAGNOSIS — E785 Hyperlipidemia, unspecified: Secondary | ICD-10-CM | POA: Diagnosis not present

## 2016-03-04 DIAGNOSIS — I10 Essential (primary) hypertension: Secondary | ICD-10-CM | POA: Diagnosis not present

## 2016-03-27 DIAGNOSIS — E785 Hyperlipidemia, unspecified: Secondary | ICD-10-CM | POA: Diagnosis not present

## 2016-03-27 DIAGNOSIS — R0789 Other chest pain: Secondary | ICD-10-CM | POA: Diagnosis not present

## 2016-03-27 DIAGNOSIS — I10 Essential (primary) hypertension: Secondary | ICD-10-CM | POA: Diagnosis not present

## 2016-05-06 DIAGNOSIS — R7303 Prediabetes: Secondary | ICD-10-CM | POA: Diagnosis not present

## 2016-05-06 DIAGNOSIS — I519 Heart disease, unspecified: Secondary | ICD-10-CM | POA: Diagnosis not present

## 2016-05-06 DIAGNOSIS — E8801 Alpha-1-antitrypsin deficiency: Secondary | ICD-10-CM | POA: Diagnosis not present

## 2016-05-06 DIAGNOSIS — I10 Essential (primary) hypertension: Secondary | ICD-10-CM | POA: Diagnosis not present

## 2016-05-06 DIAGNOSIS — J019 Acute sinusitis, unspecified: Secondary | ICD-10-CM | POA: Diagnosis not present

## 2016-05-06 DIAGNOSIS — D509 Iron deficiency anemia, unspecified: Secondary | ICD-10-CM | POA: Diagnosis not present

## 2016-05-06 DIAGNOSIS — E782 Mixed hyperlipidemia: Secondary | ICD-10-CM | POA: Diagnosis not present

## 2016-05-16 DIAGNOSIS — D509 Iron deficiency anemia, unspecified: Secondary | ICD-10-CM | POA: Diagnosis not present

## 2016-05-17 DIAGNOSIS — D509 Iron deficiency anemia, unspecified: Secondary | ICD-10-CM | POA: Diagnosis not present

## 2016-08-05 DIAGNOSIS — H40003 Preglaucoma, unspecified, bilateral: Secondary | ICD-10-CM | POA: Diagnosis not present

## 2016-09-02 DIAGNOSIS — I10 Essential (primary) hypertension: Secondary | ICD-10-CM | POA: Diagnosis not present

## 2016-09-02 DIAGNOSIS — R001 Bradycardia, unspecified: Secondary | ICD-10-CM | POA: Diagnosis not present

## 2016-09-02 DIAGNOSIS — R0789 Other chest pain: Secondary | ICD-10-CM | POA: Diagnosis not present

## 2016-09-02 DIAGNOSIS — E785 Hyperlipidemia, unspecified: Secondary | ICD-10-CM | POA: Diagnosis not present

## 2016-09-10 DIAGNOSIS — R0789 Other chest pain: Secondary | ICD-10-CM | POA: Diagnosis not present

## 2016-09-11 DIAGNOSIS — R0789 Other chest pain: Secondary | ICD-10-CM | POA: Diagnosis not present

## 2016-09-11 DIAGNOSIS — R001 Bradycardia, unspecified: Secondary | ICD-10-CM | POA: Diagnosis not present

## 2016-09-17 DIAGNOSIS — D509 Iron deficiency anemia, unspecified: Secondary | ICD-10-CM | POA: Diagnosis not present

## 2016-09-19 DIAGNOSIS — D509 Iron deficiency anemia, unspecified: Secondary | ICD-10-CM | POA: Diagnosis not present

## 2016-10-07 DIAGNOSIS — I10 Essential (primary) hypertension: Secondary | ICD-10-CM | POA: Diagnosis not present

## 2016-10-07 DIAGNOSIS — M150 Primary generalized (osteo)arthritis: Secondary | ICD-10-CM | POA: Diagnosis not present

## 2016-10-07 DIAGNOSIS — N183 Chronic kidney disease, stage 3 (moderate): Secondary | ICD-10-CM | POA: Diagnosis not present

## 2016-10-07 DIAGNOSIS — R7303 Prediabetes: Secondary | ICD-10-CM | POA: Diagnosis not present

## 2016-10-07 DIAGNOSIS — E782 Mixed hyperlipidemia: Secondary | ICD-10-CM | POA: Diagnosis not present

## 2016-10-07 DIAGNOSIS — J449 Chronic obstructive pulmonary disease, unspecified: Secondary | ICD-10-CM | POA: Diagnosis not present

## 2016-10-08 DIAGNOSIS — Z1231 Encounter for screening mammogram for malignant neoplasm of breast: Secondary | ICD-10-CM | POA: Diagnosis not present

## 2016-11-03 DIAGNOSIS — K8020 Calculus of gallbladder without cholecystitis without obstruction: Secondary | ICD-10-CM | POA: Diagnosis not present

## 2016-11-03 DIAGNOSIS — Z452 Encounter for adjustment and management of vascular access device: Secondary | ICD-10-CM | POA: Diagnosis not present

## 2016-11-03 DIAGNOSIS — Z8679 Personal history of other diseases of the circulatory system: Secondary | ICD-10-CM | POA: Diagnosis not present

## 2016-11-03 DIAGNOSIS — N179 Acute kidney failure, unspecified: Secondary | ICD-10-CM | POA: Insufficient documentation

## 2016-11-03 DIAGNOSIS — R933 Abnormal findings on diagnostic imaging of other parts of digestive tract: Secondary | ICD-10-CM | POA: Diagnosis not present

## 2016-11-03 DIAGNOSIS — L0889 Other specified local infections of the skin and subcutaneous tissue: Secondary | ICD-10-CM | POA: Diagnosis not present

## 2016-11-03 DIAGNOSIS — K432 Incisional hernia without obstruction or gangrene: Secondary | ICD-10-CM | POA: Diagnosis present

## 2016-11-03 DIAGNOSIS — I517 Cardiomegaly: Secondary | ICD-10-CM | POA: Diagnosis not present

## 2016-11-03 DIAGNOSIS — I959 Hypotension, unspecified: Secondary | ICD-10-CM

## 2016-11-03 DIAGNOSIS — K5792 Diverticulitis of intestine, part unspecified, without perforation or abscess without bleeding: Secondary | ICD-10-CM | POA: Diagnosis not present

## 2016-11-03 DIAGNOSIS — E872 Acidosis: Secondary | ICD-10-CM | POA: Diagnosis present

## 2016-11-03 DIAGNOSIS — K605 Anorectal fistula: Secondary | ICD-10-CM | POA: Diagnosis present

## 2016-11-03 DIAGNOSIS — R55 Syncope and collapse: Secondary | ICD-10-CM | POA: Diagnosis not present

## 2016-11-03 DIAGNOSIS — K626 Ulcer of anus and rectum: Secondary | ICD-10-CM | POA: Diagnosis not present

## 2016-11-03 DIAGNOSIS — R6520 Severe sepsis without septic shock: Secondary | ICD-10-CM | POA: Diagnosis present

## 2016-11-03 DIAGNOSIS — Z87898 Personal history of other specified conditions: Secondary | ICD-10-CM | POA: Insufficient documentation

## 2016-11-03 DIAGNOSIS — R6521 Severe sepsis with septic shock: Secondary | ICD-10-CM | POA: Diagnosis not present

## 2016-11-03 DIAGNOSIS — I10 Essential (primary) hypertension: Secondary | ICD-10-CM | POA: Diagnosis present

## 2016-11-03 DIAGNOSIS — Y92009 Unspecified place in unspecified non-institutional (private) residence as the place of occurrence of the external cause: Secondary | ICD-10-CM | POA: Diagnosis not present

## 2016-11-03 DIAGNOSIS — M6282 Rhabdomyolysis: Secondary | ICD-10-CM | POA: Diagnosis not present

## 2016-11-03 DIAGNOSIS — R05 Cough: Secondary | ICD-10-CM | POA: Diagnosis not present

## 2016-11-03 DIAGNOSIS — K5090 Crohn's disease, unspecified, without complications: Secondary | ICD-10-CM | POA: Diagnosis present

## 2016-11-03 DIAGNOSIS — K5720 Diverticulitis of large intestine with perforation and abscess without bleeding: Secondary | ICD-10-CM | POA: Diagnosis present

## 2016-11-03 DIAGNOSIS — K50919 Crohn's disease, unspecified, with unspecified complications: Secondary | ICD-10-CM | POA: Diagnosis not present

## 2016-11-03 DIAGNOSIS — A498 Other bacterial infections of unspecified site: Secondary | ICD-10-CM | POA: Diagnosis not present

## 2016-11-03 DIAGNOSIS — I9589 Other hypotension: Secondary | ICD-10-CM | POA: Diagnosis not present

## 2016-11-03 DIAGNOSIS — D72829 Elevated white blood cell count, unspecified: Secondary | ICD-10-CM | POA: Diagnosis not present

## 2016-11-03 DIAGNOSIS — Z792 Long term (current) use of antibiotics: Secondary | ICD-10-CM | POA: Diagnosis not present

## 2016-11-03 DIAGNOSIS — A4151 Sepsis due to Escherichia coli [E. coli]: Secondary | ICD-10-CM | POA: Diagnosis present

## 2016-11-03 DIAGNOSIS — N3000 Acute cystitis without hematuria: Secondary | ICD-10-CM | POA: Diagnosis not present

## 2016-11-03 DIAGNOSIS — W19XXXA Unspecified fall, initial encounter: Secondary | ICD-10-CM | POA: Diagnosis not present

## 2016-11-03 DIAGNOSIS — A419 Sepsis, unspecified organism: Secondary | ICD-10-CM | POA: Diagnosis not present

## 2016-11-03 DIAGNOSIS — E785 Hyperlipidemia, unspecified: Secondary | ICD-10-CM | POA: Diagnosis present

## 2016-11-03 DIAGNOSIS — K921 Melena: Secondary | ICD-10-CM | POA: Diagnosis not present

## 2016-11-03 DIAGNOSIS — Z87891 Personal history of nicotine dependence: Secondary | ICD-10-CM | POA: Diagnosis not present

## 2016-11-03 DIAGNOSIS — Z7982 Long term (current) use of aspirin: Secondary | ICD-10-CM | POA: Diagnosis not present

## 2016-11-03 DIAGNOSIS — T796XXA Traumatic ischemia of muscle, initial encounter: Secondary | ICD-10-CM | POA: Diagnosis not present

## 2016-11-03 DIAGNOSIS — Z880 Allergy status to penicillin: Secondary | ICD-10-CM | POA: Diagnosis not present

## 2016-11-03 DIAGNOSIS — K6289 Other specified diseases of anus and rectum: Secondary | ICD-10-CM | POA: Diagnosis not present

## 2016-11-03 HISTORY — DX: Personal history of other specified conditions: Z87.898

## 2016-11-03 HISTORY — DX: Syncope and collapse: R55

## 2016-11-03 HISTORY — DX: Hypotension, unspecified: I95.9

## 2016-11-03 HISTORY — DX: Acute kidney failure, unspecified: N17.9

## 2016-11-08 ENCOUNTER — Inpatient Hospital Stay
Admission: RE | Admit: 2016-11-08 | Discharge: 2016-11-28 | Disposition: A | Discharge status: Skilled Nursing Facility | Payer: Medicare Other | Attending: Internal Medicine | Admitting: Internal Medicine

## 2016-11-08 DIAGNOSIS — N828 Other female genital tract fistulae: Secondary | ICD-10-CM | POA: Diagnosis present

## 2016-11-08 DIAGNOSIS — R278 Other lack of coordination: Secondary | ICD-10-CM | POA: Diagnosis not present

## 2016-11-08 DIAGNOSIS — N823 Fistula of vagina to large intestine: Secondary | ICD-10-CM | POA: Diagnosis not present

## 2016-11-08 DIAGNOSIS — D649 Anemia, unspecified: Secondary | ICD-10-CM | POA: Diagnosis present

## 2016-11-08 DIAGNOSIS — I9589 Other hypotension: Secondary | ICD-10-CM | POA: Diagnosis not present

## 2016-11-08 DIAGNOSIS — R42 Dizziness and giddiness: Secondary | ICD-10-CM | POA: Diagnosis present

## 2016-11-08 DIAGNOSIS — G8912 Acute post-thoracotomy pain: Secondary | ICD-10-CM | POA: Diagnosis not present

## 2016-11-08 DIAGNOSIS — E876 Hypokalemia: Secondary | ICD-10-CM | POA: Diagnosis present

## 2016-11-08 DIAGNOSIS — Z6839 Body mass index (BMI) 39.0-39.9, adult: Secondary | ICD-10-CM | POA: Diagnosis not present

## 2016-11-08 DIAGNOSIS — R2681 Unsteadiness on feet: Secondary | ICD-10-CM | POA: Diagnosis not present

## 2016-11-08 DIAGNOSIS — R2689 Other abnormalities of gait and mobility: Secondary | ICD-10-CM | POA: Diagnosis not present

## 2016-11-08 DIAGNOSIS — E440 Moderate protein-calorie malnutrition: Secondary | ICD-10-CM | POA: Diagnosis present

## 2016-11-08 DIAGNOSIS — L89153 Pressure ulcer of sacral region, stage 3: Secondary | ICD-10-CM | POA: Diagnosis present

## 2016-11-08 DIAGNOSIS — M7989 Other specified soft tissue disorders: Secondary | ICD-10-CM

## 2016-11-08 DIAGNOSIS — M170 Bilateral primary osteoarthritis of knee: Secondary | ICD-10-CM | POA: Diagnosis present

## 2016-11-08 DIAGNOSIS — Z9289 Personal history of other medical treatment: Secondary | ICD-10-CM

## 2016-11-08 DIAGNOSIS — G894 Chronic pain syndrome: Secondary | ICD-10-CM | POA: Diagnosis present

## 2016-11-08 DIAGNOSIS — K631 Perforation of intestine (nontraumatic): Secondary | ICD-10-CM | POA: Diagnosis not present

## 2016-11-08 DIAGNOSIS — E870 Hyperosmolality and hypernatremia: Secondary | ICD-10-CM | POA: Diagnosis present

## 2016-11-08 DIAGNOSIS — E46 Unspecified protein-calorie malnutrition: Secondary | ICD-10-CM | POA: Diagnosis not present

## 2016-11-08 DIAGNOSIS — R488 Other symbolic dysfunctions: Secondary | ICD-10-CM | POA: Diagnosis not present

## 2016-11-08 DIAGNOSIS — K5090 Crohn's disease, unspecified, without complications: Secondary | ICD-10-CM | POA: Diagnosis present

## 2016-11-08 DIAGNOSIS — M6281 Muscle weakness (generalized): Secondary | ICD-10-CM | POA: Diagnosis not present

## 2016-11-08 DIAGNOSIS — R109 Unspecified abdominal pain: Secondary | ICD-10-CM | POA: Diagnosis not present

## 2016-11-08 DIAGNOSIS — K5780 Diverticulitis of intestine, part unspecified, with perforation and abscess without bleeding: Secondary | ICD-10-CM | POA: Diagnosis present

## 2016-11-08 DIAGNOSIS — K5720 Diverticulitis of large intestine with perforation and abscess without bleeding: Secondary | ICD-10-CM | POA: Diagnosis not present

## 2016-11-08 DIAGNOSIS — Z792 Long term (current) use of antibiotics: Secondary | ICD-10-CM | POA: Diagnosis not present

## 2016-11-08 DIAGNOSIS — N390 Urinary tract infection, site not specified: Secondary | ICD-10-CM | POA: Diagnosis not present

## 2016-11-08 DIAGNOSIS — B9620 Unspecified Escherichia coli [E. coli] as the cause of diseases classified elsewhere: Secondary | ICD-10-CM | POA: Diagnosis not present

## 2016-11-08 DIAGNOSIS — R531 Weakness: Secondary | ICD-10-CM | POA: Diagnosis not present

## 2016-11-08 DIAGNOSIS — N179 Acute kidney failure, unspecified: Secondary | ICD-10-CM | POA: Diagnosis present

## 2016-11-08 DIAGNOSIS — Z95828 Presence of other vascular implants and grafts: Secondary | ICD-10-CM

## 2016-11-08 DIAGNOSIS — M25562 Pain in left knee: Secondary | ICD-10-CM | POA: Diagnosis not present

## 2016-11-08 DIAGNOSIS — A419 Sepsis, unspecified organism: Secondary | ICD-10-CM | POA: Diagnosis not present

## 2016-11-08 DIAGNOSIS — E785 Hyperlipidemia, unspecified: Secondary | ICD-10-CM | POA: Diagnosis not present

## 2016-11-08 DIAGNOSIS — Z452 Encounter for adjustment and management of vascular access device: Secondary | ICD-10-CM | POA: Diagnosis not present

## 2016-11-08 DIAGNOSIS — M179 Osteoarthritis of knee, unspecified: Secondary | ICD-10-CM | POA: Diagnosis not present

## 2016-11-08 DIAGNOSIS — K5649 Other impaction of intestine: Secondary | ICD-10-CM | POA: Diagnosis not present

## 2016-11-08 DIAGNOSIS — A4151 Sepsis due to Escherichia coli [E. coli]: Secondary | ICD-10-CM | POA: Diagnosis not present

## 2016-11-08 DIAGNOSIS — M25561 Pain in right knee: Secondary | ICD-10-CM | POA: Diagnosis not present

## 2016-11-08 DIAGNOSIS — T796XXA Traumatic ischemia of muscle, initial encounter: Secondary | ICD-10-CM | POA: Diagnosis not present

## 2016-11-09 ENCOUNTER — Other Ambulatory Visit (HOSPITAL_COMMUNITY): Payer: Medicare Other

## 2016-11-09 DIAGNOSIS — Z452 Encounter for adjustment and management of vascular access device: Secondary | ICD-10-CM | POA: Diagnosis not present

## 2016-11-09 LAB — CBC WITH DIFFERENTIAL/PLATELET
Basophils Absolute: 0 10*3/uL (ref 0.0–0.1)
Basophils Relative: 0 %
Eosinophils Absolute: 0.1 10*3/uL (ref 0.0–0.7)
Eosinophils Relative: 1 %
HCT: 23.5 % — ABNORMAL LOW (ref 36.0–46.0)
Hemoglobin: 8 g/dL — ABNORMAL LOW (ref 12.0–15.0)
Lymphocytes Relative: 20 %
Lymphs Abs: 2.4 10*3/uL (ref 0.7–4.0)
MCH: 26.8 pg (ref 26.0–34.0)
MCHC: 34 g/dL (ref 30.0–36.0)
MCV: 78.9 fL (ref 78.0–100.0)
Monocytes Absolute: 1.2 10*3/uL — ABNORMAL HIGH (ref 0.1–1.0)
Monocytes Relative: 10 %
Neutro Abs: 8.1 10*3/uL — ABNORMAL HIGH (ref 1.7–7.7)
Neutrophils Relative %: 69 %
Platelets: 144 10*3/uL — ABNORMAL LOW (ref 150–400)
RBC: 2.98 MIL/uL — ABNORMAL LOW (ref 3.87–5.11)
RDW: 14.9 % (ref 11.5–15.5)
WBC: 11.8 10*3/uL — ABNORMAL HIGH (ref 4.0–10.5)

## 2016-11-09 LAB — COMPREHENSIVE METABOLIC PANEL
ALT: 94 U/L — ABNORMAL HIGH (ref 14–54)
AST: 115 U/L — ABNORMAL HIGH (ref 15–41)
Albumin: 1.6 g/dL — ABNORMAL LOW (ref 3.5–5.0)
Alkaline Phosphatase: 100 U/L (ref 38–126)
Anion gap: 8 (ref 5–15)
BUN: 20 mg/dL (ref 6–20)
CO2: 22 mmol/L (ref 22–32)
Calcium: 8 mg/dL — ABNORMAL LOW (ref 8.9–10.3)
Chloride: 118 mmol/L — ABNORMAL HIGH (ref 101–111)
Creatinine, Ser: 0.97 mg/dL (ref 0.44–1.00)
GFR calc Af Amer: >60 mL/min (ref >60)
GFR calc non Af Amer: 56 mL/min — ABNORMAL LOW (ref >60)
Glucose, Bld: 106 mg/dL — ABNORMAL HIGH (ref 65–99)
Potassium: 3.1 mmol/L — ABNORMAL LOW (ref 3.5–5.1)
Sodium: 148 mmol/L — ABNORMAL HIGH (ref 135–145)
Total Bilirubin: 8.6 mg/dL — ABNORMAL HIGH (ref 0.3–1.2)
Total Protein: 5 g/dL — ABNORMAL LOW (ref 6.5–8.1)

## 2016-11-10 LAB — POTASSIUM: Potassium: 3.9 mmol/L (ref 3.5–5.1)

## 2016-11-11 ENCOUNTER — Encounter (HOSPITAL_BASED_OUTPATIENT_CLINIC_OR_DEPARTMENT_OTHER): Payer: Medicare Other

## 2016-11-11 DIAGNOSIS — M7989 Other specified soft tissue disorders: Secondary | ICD-10-CM

## 2016-11-11 LAB — COMPREHENSIVE METABOLIC PANEL
ALK PHOS: 140 U/L — AB (ref 38–126)
ALT: 91 U/L — AB (ref 14–54)
AST: 97 U/L — AB (ref 15–41)
Albumin: 1.6 g/dL — ABNORMAL LOW (ref 3.5–5.0)
Anion gap: 3 — ABNORMAL LOW (ref 5–15)
BUN: 13 mg/dL (ref 6–20)
CALCIUM: 7.9 mg/dL — AB (ref 8.9–10.3)
CHLORIDE: 118 mmol/L — AB (ref 101–111)
CO2: 25 mmol/L (ref 22–32)
Creatinine, Ser: 0.75 mg/dL (ref 0.44–1.00)
GFR calc non Af Amer: >60 mL/min (ref >60)
Glucose, Bld: 106 mg/dL — ABNORMAL HIGH (ref 65–99)
Potassium: 3.7 mmol/L (ref 3.5–5.1)
SODIUM: 146 mmol/L — AB (ref 135–145)
Total Bilirubin: 5 mg/dL — ABNORMAL HIGH (ref 0.3–1.2)
Total Protein: 5 g/dL — ABNORMAL LOW (ref 6.5–8.1)

## 2016-11-11 LAB — MAGNESIUM: Magnesium: 1.6 mg/dL — ABNORMAL LOW (ref 1.7–2.4)

## 2016-11-11 LAB — TRIGLYCERIDES: Triglycerides: 247 mg/dL — ABNORMAL HIGH (ref <150)

## 2016-11-11 LAB — PHOSPHORUS: Phosphorus: 2.5 mg/dL (ref 2.5–4.6)

## 2016-11-11 NOTE — Progress Notes (Signed)
*  PRELIMINARY RESULTS* Vascular Ultrasound Lower extremity venous duplex has been completed.  Preliminary findings: No evidence of DVT or baker's cyst.    Farrel Demark, RDMS, RVT  11/11/2016, 4:59 PM

## 2016-11-13 LAB — BASIC METABOLIC PANEL
ANION GAP: 5 (ref 5–15)
BUN: 10 mg/dL (ref 6–20)
CALCIUM: 7.9 mg/dL — AB (ref 8.9–10.3)
CO2: 28 mmol/L (ref 22–32)
Chloride: 107 mmol/L (ref 101–111)
Creatinine, Ser: 0.73 mg/dL (ref 0.44–1.00)
GLUCOSE: 114 mg/dL — AB (ref 65–99)
Potassium: 3.8 mmol/L (ref 3.5–5.1)
Sodium: 140 mmol/L (ref 135–145)

## 2016-11-14 LAB — BASIC METABOLIC PANEL
Anion gap: 7 (ref 5–15)
BUN: 9 mg/dL (ref 6–20)
CO2: 29 mmol/L (ref 22–32)
CREATININE: 0.67 mg/dL (ref 0.44–1.00)
Calcium: 7.7 mg/dL — ABNORMAL LOW (ref 8.9–10.3)
Chloride: 104 mmol/L (ref 101–111)
GFR calc Af Amer: >60 mL/min (ref >60)
GFR calc non Af Amer: >60 mL/min (ref >60)
Glucose, Bld: 89 mg/dL (ref 65–99)
Potassium: 3.7 mmol/L (ref 3.5–5.1)
Sodium: 140 mmol/L (ref 135–145)

## 2016-11-15 LAB — BASIC METABOLIC PANEL
Anion gap: 6 (ref 5–15)
BUN: 8 mg/dL (ref 6–20)
CHLORIDE: 104 mmol/L (ref 101–111)
CO2: 29 mmol/L (ref 22–32)
CREATININE: 0.77 mg/dL (ref 0.44–1.00)
Calcium: 7.6 mg/dL — ABNORMAL LOW (ref 8.9–10.3)
GFR calc Af Amer: >60 mL/min (ref >60)
GFR calc non Af Amer: >60 mL/min (ref >60)
GLUCOSE: 99 mg/dL (ref 65–99)
POTASSIUM: 3.7 mmol/L (ref 3.5–5.1)
Sodium: 139 mmol/L (ref 135–145)

## 2016-11-16 LAB — BASIC METABOLIC PANEL
Anion gap: 6 (ref 5–15)
BUN: <5 mg/dL — ABNORMAL LOW (ref 6–20)
CO2: 30 mmol/L (ref 22–32)
Calcium: 7.7 mg/dL — ABNORMAL LOW (ref 8.9–10.3)
Chloride: 104 mmol/L (ref 101–111)
Creatinine, Ser: 0.62 mg/dL (ref 0.44–1.00)
GFR calc Af Amer: >60 mL/min (ref >60)
GLUCOSE: 109 mg/dL — AB (ref 65–99)
Potassium: 3.7 mmol/L (ref 3.5–5.1)
Sodium: 140 mmol/L (ref 135–145)

## 2016-11-18 LAB — COMPREHENSIVE METABOLIC PANEL
ALK PHOS: 153 U/L — AB (ref 38–126)
ALT: 35 U/L (ref 14–54)
AST: 31 U/L (ref 15–41)
Albumin: 1.6 g/dL — ABNORMAL LOW (ref 3.5–5.0)
Anion gap: 5 (ref 5–15)
BUN: 7 mg/dL (ref 6–20)
CALCIUM: 7.9 mg/dL — AB (ref 8.9–10.3)
CO2: 30 mmol/L (ref 22–32)
CREATININE: 0.72 mg/dL (ref 0.44–1.00)
Chloride: 105 mmol/L (ref 101–111)
Glucose, Bld: 93 mg/dL (ref 65–99)
Potassium: 4 mmol/L (ref 3.5–5.1)
Sodium: 140 mmol/L (ref 135–145)
TOTAL PROTEIN: 5.1 g/dL — AB (ref 6.5–8.1)
Total Bilirubin: 1.2 mg/dL (ref 0.3–1.2)

## 2016-11-18 LAB — MAGNESIUM: Magnesium: 1.7 mg/dL (ref 1.7–2.4)

## 2016-11-18 LAB — TRIGLYCERIDES: TRIGLYCERIDES: 99 mg/dL (ref <150)

## 2016-11-18 LAB — PHOSPHORUS: PHOSPHORUS: 2.2 mg/dL — AB (ref 2.5–4.6)

## 2016-11-20 LAB — BASIC METABOLIC PANEL
Anion gap: 5 (ref 5–15)
BUN: 9 mg/dL (ref 6–20)
CALCIUM: 7.6 mg/dL — AB (ref 8.9–10.3)
CHLORIDE: 103 mmol/L (ref 101–111)
CO2: 30 mmol/L (ref 22–32)
CREATININE: 0.72 mg/dL (ref 0.44–1.00)
GFR calc non Af Amer: >60 mL/min (ref >60)
Glucose, Bld: 90 mg/dL (ref 65–99)
Potassium: 3.6 mmol/L (ref 3.5–5.1)
SODIUM: 138 mmol/L (ref 135–145)

## 2016-11-23 LAB — CBC
HCT: 23 % — ABNORMAL LOW (ref 36.0–46.0)
Hemoglobin: 7 g/dL — ABNORMAL LOW (ref 12.0–15.0)
MCH: 27.5 pg (ref 26.0–34.0)
MCHC: 30.4 g/dL (ref 30.0–36.0)
MCV: 90.2 fL (ref 78.0–100.0)
PLATELETS: 433 10*3/uL — AB (ref 150–400)
RBC: 2.55 MIL/uL — AB (ref 3.87–5.11)
RDW: 17.4 % — AB (ref 11.5–15.5)
WBC: 5.4 10*3/uL (ref 4.0–10.5)

## 2016-11-23 LAB — BASIC METABOLIC PANEL
ANION GAP: 7 (ref 5–15)
Anion gap: 4 — ABNORMAL LOW (ref 5–15)
BUN: 8 mg/dL (ref 6–20)
BUN: 6 mg/dL (ref 6–20)
CALCIUM: 8 mg/dL — AB (ref 8.9–10.3)
CO2: 30 mmol/L (ref 22–32)
CO2: 30 mmol/L (ref 22–32)
CREATININE: 0.67 mg/dL (ref 0.44–1.00)
Calcium: 8 mg/dL — ABNORMAL LOW (ref 8.9–10.3)
Chloride: 107 mmol/L (ref 101–111)
Chloride: 104 mmol/L (ref 101–111)
Creatinine, Ser: 0.66 mg/dL (ref 0.44–1.00)
GFR calc Af Amer: >60 mL/min (ref >60)
Glucose, Bld: 103 mg/dL — ABNORMAL HIGH (ref 65–99)
Glucose, Bld: 87 mg/dL (ref 65–99)
POTASSIUM: 3.8 mmol/L (ref 3.5–5.1)
Potassium: 3.8 mmol/L (ref 3.5–5.1)
SODIUM: 141 mmol/L (ref 135–145)
SODIUM: 141 mmol/L (ref 135–145)

## 2016-11-24 LAB — CBC
HEMATOCRIT: 23.8 % — AB (ref 36.0–46.0)
HEMOGLOBIN: 7.1 g/dL — AB (ref 12.0–15.0)
MCH: 26.5 pg (ref 26.0–34.0)
MCHC: 29.8 g/dL — ABNORMAL LOW (ref 30.0–36.0)
MCV: 88.8 fL (ref 78.0–100.0)
Platelets: 411 10*3/uL — ABNORMAL HIGH (ref 150–400)
RBC: 2.68 MIL/uL — AB (ref 3.87–5.11)
RDW: 16.8 % — ABNORMAL HIGH (ref 11.5–15.5)
WBC: 6.6 10*3/uL (ref 4.0–10.5)

## 2016-11-25 LAB — COMPREHENSIVE METABOLIC PANEL
ALK PHOS: 107 U/L (ref 38–126)
ALT: 19 U/L (ref 14–54)
ANION GAP: 5 (ref 5–15)
AST: 20 U/L (ref 15–41)
Albumin: 1.7 g/dL — ABNORMAL LOW (ref 3.5–5.0)
BUN: 7 mg/dL (ref 6–20)
CALCIUM: 7.8 mg/dL — AB (ref 8.9–10.3)
CO2: 28 mmol/L (ref 22–32)
Chloride: 107 mmol/L (ref 101–111)
Creatinine, Ser: 0.71 mg/dL (ref 0.44–1.00)
GFR calc non Af Amer: >60 mL/min (ref >60)
Glucose, Bld: 86 mg/dL (ref 65–99)
POTASSIUM: 3.7 mmol/L (ref 3.5–5.1)
SODIUM: 140 mmol/L (ref 135–145)
TOTAL PROTEIN: 5.4 g/dL — AB (ref 6.5–8.1)
Total Bilirubin: 0.8 mg/dL (ref 0.3–1.2)

## 2016-11-25 LAB — CBC
HCT: 24.1 % — ABNORMAL LOW (ref 36.0–46.0)
Hemoglobin: 7.3 g/dL — ABNORMAL LOW (ref 12.0–15.0)
MCH: 26.9 pg (ref 26.0–34.0)
MCHC: 30.3 g/dL (ref 30.0–36.0)
MCV: 88.9 fL (ref 78.0–100.0)
PLATELETS: 413 10*3/uL — AB (ref 150–400)
RBC: 2.71 MIL/uL — AB (ref 3.87–5.11)
RDW: 16.5 % — ABNORMAL HIGH (ref 11.5–15.5)
WBC: 7.6 10*3/uL (ref 4.0–10.5)

## 2016-11-25 LAB — TRIGLYCERIDES: Triglycerides: 63 mg/dL (ref <150)

## 2016-11-25 LAB — MAGNESIUM: MAGNESIUM: 1.4 mg/dL — AB (ref 1.7–2.4)

## 2016-11-25 LAB — PHOSPHORUS: Phosphorus: 3.4 mg/dL (ref 2.5–4.6)

## 2016-11-26 LAB — BASIC METABOLIC PANEL
ANION GAP: 4 — AB (ref 5–15)
BUN: 8 mg/dL (ref 6–20)
CO2: 30 mmol/L (ref 22–32)
Calcium: 8.3 mg/dL — ABNORMAL LOW (ref 8.9–10.3)
Chloride: 106 mmol/L (ref 101–111)
Creatinine, Ser: 0.77 mg/dL (ref 0.44–1.00)
GFR calc Af Amer: >60 mL/min (ref >60)
Glucose, Bld: 92 mg/dL (ref 65–99)
Potassium: 3.9 mmol/L (ref 3.5–5.1)
SODIUM: 140 mmol/L (ref 135–145)

## 2016-11-26 LAB — CBC
HCT: 23.9 % — ABNORMAL LOW (ref 36.0–46.0)
HEMOGLOBIN: 7.4 g/dL — AB (ref 12.0–15.0)
MCH: 27.2 pg (ref 26.0–34.0)
MCHC: 31 g/dL (ref 30.0–36.0)
MCV: 87.9 fL (ref 78.0–100.0)
PLATELETS: 400 10*3/uL (ref 150–400)
RBC: 2.72 MIL/uL — AB (ref 3.87–5.11)
RDW: 16.7 % — ABNORMAL HIGH (ref 11.5–15.5)
WBC: 8.9 10*3/uL (ref 4.0–10.5)

## 2016-11-26 LAB — MAGNESIUM: MAGNESIUM: 2.2 mg/dL (ref 1.7–2.4)

## 2016-11-27 LAB — BASIC METABOLIC PANEL
ANION GAP: 6 (ref 5–15)
CALCIUM: 8.4 mg/dL — AB (ref 8.9–10.3)
CO2: 29 mmol/L (ref 22–32)
Chloride: 105 mmol/L (ref 101–111)
Creatinine, Ser: 0.73 mg/dL (ref 0.44–1.00)
GFR calc Af Amer: >60 mL/min (ref >60)
GFR calc non Af Amer: >60 mL/min (ref >60)
Glucose, Bld: 94 mg/dL (ref 65–99)
POTASSIUM: 4.3 mmol/L (ref 3.5–5.1)
Sodium: 140 mmol/L (ref 135–145)

## 2016-11-28 DIAGNOSIS — D500 Iron deficiency anemia secondary to blood loss (chronic): Secondary | ICD-10-CM | POA: Diagnosis not present

## 2016-11-28 DIAGNOSIS — D508 Other iron deficiency anemias: Secondary | ICD-10-CM | POA: Diagnosis not present

## 2016-11-28 DIAGNOSIS — R278 Other lack of coordination: Secondary | ICD-10-CM | POA: Diagnosis not present

## 2016-11-28 DIAGNOSIS — N390 Urinary tract infection, site not specified: Secondary | ICD-10-CM | POA: Diagnosis not present

## 2016-11-28 DIAGNOSIS — K631 Perforation of intestine (nontraumatic): Secondary | ICD-10-CM | POA: Diagnosis not present

## 2016-11-28 DIAGNOSIS — D649 Anemia, unspecified: Secondary | ICD-10-CM | POA: Diagnosis not present

## 2016-11-28 DIAGNOSIS — E782 Mixed hyperlipidemia: Secondary | ICD-10-CM | POA: Diagnosis not present

## 2016-11-28 DIAGNOSIS — A419 Sepsis, unspecified organism: Secondary | ICD-10-CM | POA: Diagnosis not present

## 2016-11-28 DIAGNOSIS — R55 Syncope and collapse: Secondary | ICD-10-CM | POA: Diagnosis not present

## 2016-11-28 DIAGNOSIS — K50813 Crohn's disease of both small and large intestine with fistula: Secondary | ICD-10-CM | POA: Diagnosis not present

## 2016-11-28 DIAGNOSIS — K5780 Diverticulitis of intestine, part unspecified, with perforation and abscess without bleeding: Secondary | ICD-10-CM | POA: Diagnosis not present

## 2016-11-28 DIAGNOSIS — M6281 Muscle weakness (generalized): Secondary | ICD-10-CM | POA: Diagnosis not present

## 2016-11-28 DIAGNOSIS — R488 Other symbolic dysfunctions: Secondary | ICD-10-CM | POA: Diagnosis not present

## 2016-11-28 DIAGNOSIS — N3000 Acute cystitis without hematuria: Secondary | ICD-10-CM | POA: Diagnosis not present

## 2016-11-28 DIAGNOSIS — R7303 Prediabetes: Secondary | ICD-10-CM | POA: Diagnosis not present

## 2016-11-28 DIAGNOSIS — R2689 Other abnormalities of gait and mobility: Secondary | ICD-10-CM | POA: Diagnosis not present

## 2016-11-28 DIAGNOSIS — R2681 Unsteadiness on feet: Secondary | ICD-10-CM | POA: Diagnosis not present

## 2016-11-28 DIAGNOSIS — I10 Essential (primary) hypertension: Secondary | ICD-10-CM | POA: Diagnosis not present

## 2016-11-28 DIAGNOSIS — N183 Chronic kidney disease, stage 3 (moderate): Secondary | ICD-10-CM | POA: Diagnosis not present

## 2016-11-28 DIAGNOSIS — R531 Weakness: Secondary | ICD-10-CM | POA: Diagnosis not present

## 2016-11-28 DIAGNOSIS — E46 Unspecified protein-calorie malnutrition: Secondary | ICD-10-CM | POA: Diagnosis not present

## 2016-11-28 DIAGNOSIS — R5381 Other malaise: Secondary | ICD-10-CM | POA: Diagnosis not present

## 2016-11-28 DIAGNOSIS — E440 Moderate protein-calorie malnutrition: Secondary | ICD-10-CM | POA: Diagnosis not present

## 2016-11-28 DIAGNOSIS — K5720 Diverticulitis of large intestine with perforation and abscess without bleeding: Secondary | ICD-10-CM | POA: Diagnosis not present

## 2016-11-28 DIAGNOSIS — R42 Dizziness and giddiness: Secondary | ICD-10-CM | POA: Diagnosis not present

## 2016-12-09 DIAGNOSIS — R5381 Other malaise: Secondary | ICD-10-CM | POA: Diagnosis not present

## 2016-12-09 DIAGNOSIS — N390 Urinary tract infection, site not specified: Secondary | ICD-10-CM | POA: Diagnosis not present

## 2016-12-09 DIAGNOSIS — A419 Sepsis, unspecified organism: Secondary | ICD-10-CM | POA: Diagnosis not present

## 2016-12-09 DIAGNOSIS — K5780 Diverticulitis of intestine, part unspecified, with perforation and abscess without bleeding: Secondary | ICD-10-CM | POA: Diagnosis not present

## 2016-12-16 DIAGNOSIS — K5780 Diverticulitis of intestine, part unspecified, with perforation and abscess without bleeding: Secondary | ICD-10-CM | POA: Diagnosis not present

## 2016-12-19 DIAGNOSIS — R55 Syncope and collapse: Secondary | ICD-10-CM | POA: Diagnosis not present

## 2016-12-19 DIAGNOSIS — N390 Urinary tract infection, site not specified: Secondary | ICD-10-CM | POA: Diagnosis not present

## 2016-12-19 DIAGNOSIS — E440 Moderate protein-calorie malnutrition: Secondary | ICD-10-CM | POA: Diagnosis not present

## 2016-12-19 DIAGNOSIS — N183 Chronic kidney disease, stage 3 (moderate): Secondary | ICD-10-CM | POA: Diagnosis not present

## 2016-12-19 DIAGNOSIS — E782 Mixed hyperlipidemia: Secondary | ICD-10-CM | POA: Diagnosis not present

## 2016-12-19 DIAGNOSIS — K631 Perforation of intestine (nontraumatic): Secondary | ICD-10-CM | POA: Diagnosis not present

## 2016-12-19 DIAGNOSIS — R7303 Prediabetes: Secondary | ICD-10-CM | POA: Diagnosis not present

## 2016-12-19 DIAGNOSIS — M6281 Muscle weakness (generalized): Secondary | ICD-10-CM | POA: Diagnosis not present

## 2016-12-19 DIAGNOSIS — I10 Essential (primary) hypertension: Secondary | ICD-10-CM | POA: Diagnosis not present

## 2016-12-23 DIAGNOSIS — R42 Dizziness and giddiness: Secondary | ICD-10-CM | POA: Diagnosis not present

## 2016-12-23 DIAGNOSIS — N390 Urinary tract infection, site not specified: Secondary | ICD-10-CM | POA: Diagnosis not present

## 2016-12-27 DIAGNOSIS — K50813 Crohn's disease of both small and large intestine with fistula: Secondary | ICD-10-CM | POA: Diagnosis not present

## 2017-01-01 DIAGNOSIS — D500 Iron deficiency anemia secondary to blood loss (chronic): Secondary | ICD-10-CM | POA: Diagnosis not present

## 2017-01-14 DIAGNOSIS — K5720 Diverticulitis of large intestine with perforation and abscess without bleeding: Secondary | ICD-10-CM | POA: Diagnosis not present

## 2017-01-14 DIAGNOSIS — Z87440 Personal history of urinary (tract) infections: Secondary | ICD-10-CM | POA: Diagnosis not present

## 2017-01-14 DIAGNOSIS — M25551 Pain in right hip: Secondary | ICD-10-CM | POA: Diagnosis not present

## 2017-01-14 DIAGNOSIS — D649 Anemia, unspecified: Secondary | ICD-10-CM | POA: Diagnosis not present

## 2017-01-14 DIAGNOSIS — K50913 Crohn's disease, unspecified, with fistula: Secondary | ICD-10-CM | POA: Diagnosis not present

## 2017-01-14 DIAGNOSIS — Z9181 History of falling: Secondary | ICD-10-CM | POA: Diagnosis not present

## 2017-01-14 DIAGNOSIS — N829 Female genital tract fistula, unspecified: Secondary | ICD-10-CM | POA: Diagnosis not present

## 2017-01-14 DIAGNOSIS — Z792 Long term (current) use of antibiotics: Secondary | ICD-10-CM | POA: Diagnosis not present

## 2017-01-14 DIAGNOSIS — E440 Moderate protein-calorie malnutrition: Secondary | ICD-10-CM | POA: Diagnosis not present

## 2017-01-14 DIAGNOSIS — M6281 Muscle weakness (generalized): Secondary | ICD-10-CM | POA: Diagnosis not present

## 2017-01-16 DIAGNOSIS — K50913 Crohn's disease, unspecified, with fistula: Secondary | ICD-10-CM | POA: Diagnosis not present

## 2017-01-16 DIAGNOSIS — D649 Anemia, unspecified: Secondary | ICD-10-CM | POA: Diagnosis not present

## 2017-01-16 DIAGNOSIS — M25551 Pain in right hip: Secondary | ICD-10-CM | POA: Diagnosis not present

## 2017-01-16 DIAGNOSIS — K5720 Diverticulitis of large intestine with perforation and abscess without bleeding: Secondary | ICD-10-CM | POA: Diagnosis not present

## 2017-01-16 DIAGNOSIS — E440 Moderate protein-calorie malnutrition: Secondary | ICD-10-CM | POA: Diagnosis not present

## 2017-01-16 DIAGNOSIS — M6281 Muscle weakness (generalized): Secondary | ICD-10-CM | POA: Diagnosis not present

## 2017-01-20 DIAGNOSIS — W19XXXA Unspecified fall, initial encounter: Secondary | ICD-10-CM | POA: Diagnosis not present

## 2017-01-20 DIAGNOSIS — M25551 Pain in right hip: Secondary | ICD-10-CM | POA: Diagnosis not present

## 2017-01-22 DIAGNOSIS — M1611 Unilateral primary osteoarthritis, right hip: Secondary | ICD-10-CM | POA: Diagnosis not present

## 2017-01-22 DIAGNOSIS — R7303 Prediabetes: Secondary | ICD-10-CM | POA: Diagnosis not present

## 2017-01-22 DIAGNOSIS — N183 Chronic kidney disease, stage 3 (moderate): Secondary | ICD-10-CM | POA: Diagnosis not present

## 2017-01-22 DIAGNOSIS — M150 Primary generalized (osteo)arthritis: Secondary | ICD-10-CM | POA: Diagnosis not present

## 2017-01-22 DIAGNOSIS — K5700 Diverticulitis of small intestine with perforation and abscess without bleeding: Secondary | ICD-10-CM | POA: Diagnosis not present

## 2017-01-22 DIAGNOSIS — I10 Essential (primary) hypertension: Secondary | ICD-10-CM | POA: Diagnosis not present

## 2017-01-22 DIAGNOSIS — E782 Mixed hyperlipidemia: Secondary | ICD-10-CM | POA: Diagnosis not present

## 2017-01-22 DIAGNOSIS — J449 Chronic obstructive pulmonary disease, unspecified: Secondary | ICD-10-CM | POA: Diagnosis not present

## 2017-01-24 DIAGNOSIS — K50913 Crohn's disease, unspecified, with fistula: Secondary | ICD-10-CM | POA: Diagnosis not present

## 2017-01-24 DIAGNOSIS — K5720 Diverticulitis of large intestine with perforation and abscess without bleeding: Secondary | ICD-10-CM | POA: Diagnosis not present

## 2017-01-24 DIAGNOSIS — M6281 Muscle weakness (generalized): Secondary | ICD-10-CM | POA: Diagnosis not present

## 2017-01-24 DIAGNOSIS — D649 Anemia, unspecified: Secondary | ICD-10-CM | POA: Diagnosis not present

## 2017-01-24 DIAGNOSIS — E440 Moderate protein-calorie malnutrition: Secondary | ICD-10-CM | POA: Diagnosis not present

## 2017-01-24 DIAGNOSIS — M25551 Pain in right hip: Secondary | ICD-10-CM | POA: Diagnosis not present

## 2017-01-28 DIAGNOSIS — D649 Anemia, unspecified: Secondary | ICD-10-CM | POA: Diagnosis not present

## 2017-01-28 DIAGNOSIS — K5720 Diverticulitis of large intestine with perforation and abscess without bleeding: Secondary | ICD-10-CM | POA: Diagnosis not present

## 2017-01-28 DIAGNOSIS — E440 Moderate protein-calorie malnutrition: Secondary | ICD-10-CM | POA: Diagnosis not present

## 2017-01-28 DIAGNOSIS — M6281 Muscle weakness (generalized): Secondary | ICD-10-CM | POA: Diagnosis not present

## 2017-01-28 DIAGNOSIS — M25551 Pain in right hip: Secondary | ICD-10-CM | POA: Diagnosis not present

## 2017-01-28 DIAGNOSIS — K50913 Crohn's disease, unspecified, with fistula: Secondary | ICD-10-CM | POA: Diagnosis not present

## 2017-01-30 DIAGNOSIS — M6281 Muscle weakness (generalized): Secondary | ICD-10-CM | POA: Diagnosis not present

## 2017-01-30 DIAGNOSIS — K50913 Crohn's disease, unspecified, with fistula: Secondary | ICD-10-CM | POA: Diagnosis not present

## 2017-01-30 DIAGNOSIS — K5720 Diverticulitis of large intestine with perforation and abscess without bleeding: Secondary | ICD-10-CM | POA: Diagnosis not present

## 2017-01-30 DIAGNOSIS — M25551 Pain in right hip: Secondary | ICD-10-CM | POA: Diagnosis not present

## 2017-01-31 DIAGNOSIS — K50913 Crohn's disease, unspecified, with fistula: Secondary | ICD-10-CM | POA: Diagnosis not present

## 2017-01-31 DIAGNOSIS — M6281 Muscle weakness (generalized): Secondary | ICD-10-CM | POA: Diagnosis not present

## 2017-01-31 DIAGNOSIS — M25551 Pain in right hip: Secondary | ICD-10-CM | POA: Diagnosis not present

## 2017-01-31 DIAGNOSIS — K5720 Diverticulitis of large intestine with perforation and abscess without bleeding: Secondary | ICD-10-CM | POA: Diagnosis not present

## 2017-01-31 DIAGNOSIS — D649 Anemia, unspecified: Secondary | ICD-10-CM | POA: Diagnosis not present

## 2017-01-31 DIAGNOSIS — E440 Moderate protein-calorie malnutrition: Secondary | ICD-10-CM | POA: Diagnosis not present

## 2017-02-04 DIAGNOSIS — D649 Anemia, unspecified: Secondary | ICD-10-CM | POA: Diagnosis not present

## 2017-02-04 DIAGNOSIS — K50913 Crohn's disease, unspecified, with fistula: Secondary | ICD-10-CM | POA: Diagnosis not present

## 2017-02-04 DIAGNOSIS — E440 Moderate protein-calorie malnutrition: Secondary | ICD-10-CM | POA: Diagnosis not present

## 2017-02-04 DIAGNOSIS — M6281 Muscle weakness (generalized): Secondary | ICD-10-CM | POA: Diagnosis not present

## 2017-02-04 DIAGNOSIS — M25551 Pain in right hip: Secondary | ICD-10-CM | POA: Diagnosis not present

## 2017-02-04 DIAGNOSIS — K5720 Diverticulitis of large intestine with perforation and abscess without bleeding: Secondary | ICD-10-CM | POA: Diagnosis not present

## 2017-02-06 DIAGNOSIS — M6281 Muscle weakness (generalized): Secondary | ICD-10-CM | POA: Diagnosis not present

## 2017-02-06 DIAGNOSIS — D649 Anemia, unspecified: Secondary | ICD-10-CM | POA: Diagnosis not present

## 2017-02-06 DIAGNOSIS — K50913 Crohn's disease, unspecified, with fistula: Secondary | ICD-10-CM | POA: Diagnosis not present

## 2017-02-06 DIAGNOSIS — M25551 Pain in right hip: Secondary | ICD-10-CM | POA: Diagnosis not present

## 2017-02-06 DIAGNOSIS — E440 Moderate protein-calorie malnutrition: Secondary | ICD-10-CM | POA: Diagnosis not present

## 2017-02-06 DIAGNOSIS — K5720 Diverticulitis of large intestine with perforation and abscess without bleeding: Secondary | ICD-10-CM | POA: Diagnosis not present

## 2017-02-11 DIAGNOSIS — D649 Anemia, unspecified: Secondary | ICD-10-CM | POA: Diagnosis not present

## 2017-02-11 DIAGNOSIS — M25551 Pain in right hip: Secondary | ICD-10-CM | POA: Diagnosis not present

## 2017-02-11 DIAGNOSIS — E440 Moderate protein-calorie malnutrition: Secondary | ICD-10-CM | POA: Diagnosis not present

## 2017-02-11 DIAGNOSIS — K5720 Diverticulitis of large intestine with perforation and abscess without bleeding: Secondary | ICD-10-CM | POA: Diagnosis not present

## 2017-02-11 DIAGNOSIS — K50913 Crohn's disease, unspecified, with fistula: Secondary | ICD-10-CM | POA: Diagnosis not present

## 2017-02-11 DIAGNOSIS — M6281 Muscle weakness (generalized): Secondary | ICD-10-CM | POA: Diagnosis not present

## 2017-02-13 DIAGNOSIS — M6281 Muscle weakness (generalized): Secondary | ICD-10-CM | POA: Diagnosis not present

## 2017-02-13 DIAGNOSIS — K5720 Diverticulitis of large intestine with perforation and abscess without bleeding: Secondary | ICD-10-CM | POA: Diagnosis not present

## 2017-02-13 DIAGNOSIS — D649 Anemia, unspecified: Secondary | ICD-10-CM | POA: Diagnosis not present

## 2017-02-13 DIAGNOSIS — K50913 Crohn's disease, unspecified, with fistula: Secondary | ICD-10-CM | POA: Diagnosis not present

## 2017-02-13 DIAGNOSIS — E440 Moderate protein-calorie malnutrition: Secondary | ICD-10-CM | POA: Diagnosis not present

## 2017-02-13 DIAGNOSIS — M25551 Pain in right hip: Secondary | ICD-10-CM | POA: Diagnosis not present

## 2017-03-13 DIAGNOSIS — M1611 Unilateral primary osteoarthritis, right hip: Secondary | ICD-10-CM | POA: Diagnosis not present

## 2017-03-24 DIAGNOSIS — D509 Iron deficiency anemia, unspecified: Secondary | ICD-10-CM | POA: Diagnosis not present

## 2017-03-27 DIAGNOSIS — D509 Iron deficiency anemia, unspecified: Secondary | ICD-10-CM | POA: Diagnosis not present

## 2017-03-31 DIAGNOSIS — H40003 Preglaucoma, unspecified, bilateral: Secondary | ICD-10-CM | POA: Diagnosis not present

## 2017-04-02 DIAGNOSIS — D509 Iron deficiency anemia, unspecified: Secondary | ICD-10-CM | POA: Diagnosis not present

## 2017-04-04 NOTE — Progress Notes (Signed)
Please place orders in EPIC as patient is being scheduled for a pre-op appointment! Thank you! 

## 2017-04-06 ENCOUNTER — Ambulatory Visit: Payer: Self-pay | Admitting: Orthopedic Surgery

## 2017-04-07 DIAGNOSIS — D509 Iron deficiency anemia, unspecified: Secondary | ICD-10-CM | POA: Diagnosis not present

## 2017-04-09 ENCOUNTER — Encounter: Payer: Self-pay | Admitting: Cardiology

## 2017-04-09 ENCOUNTER — Ambulatory Visit (INDEPENDENT_AMBULATORY_CARE_PROVIDER_SITE_OTHER): Payer: Medicare Other | Admitting: Cardiology

## 2017-04-09 VITALS — BP 120/64 | HR 56 | Resp 10 | Ht 61.0 in | Wt 185.0 lb

## 2017-04-09 DIAGNOSIS — I739 Peripheral vascular disease, unspecified: Secondary | ICD-10-CM

## 2017-04-09 DIAGNOSIS — Z01818 Encounter for other preprocedural examination: Secondary | ICD-10-CM | POA: Diagnosis not present

## 2017-04-09 DIAGNOSIS — I10 Essential (primary) hypertension: Secondary | ICD-10-CM

## 2017-04-09 DIAGNOSIS — E785 Hyperlipidemia, unspecified: Secondary | ICD-10-CM | POA: Diagnosis not present

## 2017-04-09 HISTORY — DX: Peripheral vascular disease, unspecified: I73.9

## 2017-04-09 NOTE — Progress Notes (Signed)
Cardiology Office Note:    Date:  04/09/2017   ID:  Ashley Ritter, DOB July 13, 1940, MRN [id number]  PCP:  Abigail MiyamotoPerry, Lawrence Edward, MD  Cardiologist:  Gypsy Balsamobert Krasowski, MD    Referring MD: Abigail MiyamotoPerry, Lawrence Edward,*   Chief Complaint  Patient presents with  . Follow-up  . Pre-op Exam    Hip Surgery Nov 14  I need hip surgery I would like to be evaluated from her point of view  History of Present Illness:    Ashley Ritter is a 76 y.o. female with peripheral vascular disease and risk factors for coronary artery disease.  She comes today because she required hip replacement surgery and she is here to be evaluated before the surgery.  Denies have any cardiac symptoms however her ability to exercise severely limited because severe pain on the right hip.  With her risk factors I think we must do some testing trying to rectify her risks.  I will ask her to have echocardiogram to assess left ventricular ejection fraction.  Will call our old office and get a copy of her carotid ultrasound.  She will be also scheduled to have Lexiscan to make sure there is no inducible ischemia.  Past Medical History:  Diagnosis Date  . Atypical chest pain   . Carotid bruit   . Hyperlipidemia   . Hypertension   . Vertigo     Past Surgical History:  Procedure Laterality Date  . GASTRIC RESTRICTION SURGERY    . SHOULDER SURGERY    . TUBAL LIGATION      Current Medications: Current Meds  Medication Sig  . aspirin EC 81 MG tablet Take 1 tablet by mouth daily.  . B Complex Vitamins (VITAMIN-B COMPLEX) TABS Place 1 tablet under the tongue daily.  . DOCOSAHEXAENOIC ACID PO Take 1 capsule by mouth daily.  . ferrous fumarate-iron polysaccharide complex (TANDEM) 162-115.2 MG CAPS capsule Take 1 capsule by mouth daily.  . meclizine (ANTIVERT) 25 MG tablet Take 25 mg by mouth as needed.  . tamsulosin (FLOMAX) 0.4 MG CAPS capsule Take 0.4 mg by mouth daily.  Marland Kitchen. triamterene-hydrochlorothiazide (DYAZIDE)  37.5-25 MG capsule Take 1 capsule by mouth daily.     Allergies:   Penicillins and Oxycodone   Social History   Social History  . Marital status: Married    Spouse name: N/A  . Number of children: N/A  . Years of education: N/A   Social History Main Topics  . Smoking status: Former Games developermoker  . Smokeless tobacco: Never Used  . Alcohol use No  . Drug use: No  . Sexual activity: Not Asked   Other Topics Concern  . None   Social History Narrative  . None     Family History: The patient's family history includes Diabetes in her mother; Heart attack in her father; Heart disease in her father; Hyperlipidemia in her brother; Hypertension in her brother. ROS:   Please see the history of present illness.    All 14 point review of systems negative except as described per history of present illness  EKGs/Labs/Other Studies Reviewed:      Recent Labs: 11/25/2016: ALT 19 11/26/2016: Hemoglobin 7.4; Magnesium 2.2; Platelets 400 11/27/2016: BUN <5; Creatinine, Ser 0.73; Potassium 4.3; Sodium 140  Recent Lipid Panel    Component Value Date/Time   TRIG 63 11/25/2016 0642    Physical Exam:    VS:  BP 120/64   Pulse (!) 56   Resp 10   Ht 5\' 1"  (  1.549 m)   Wt 185 lb (83.9 kg)   BMI 34.96 kg/m     Wt Readings from Last 3 Encounters:  04/09/17 185 lb (83.9 kg)     GEN:  Well nourished, well developed in no acute distress HEENT: Normal NECK: No JVD; No carotid bruits LYMPHATICS: No lymphadenopathy CARDIAC: RRR, no murmurs, no rubs, no gallops RESPIRATORY:  Clear to auscultation without rales, wheezing or rhonchi  ABDOMEN: Soft, non-tender, non-distended MUSCULOSKELETAL:  No edema; No deformity  SKIN: Warm and dry LOWER EXTREMITIES: no swelling NEUROLOGIC:  Alert and oriented x 3 PSYCHIATRIC:  Normal affect   ASSESSMENT:    1. Essential hypertension   2. Dyslipidemia   3. Peripheral vascular disease (HCC)    PLAN:    In order of problems listed  above:  1. Essential hypertension: Blood pressure well controlled continue present management. 2. Dyslipidemia: She does not take statin she takes garlic and I told her that this not sufficient in my opinion she should be taking high intensity statin.  She said she will think it over. 3. Peripheral vascular disease: Asymptomatic we will retrieve a copy of her carotid ultrasounds from our old office.  On appropriate medication ideally need to be on high intensity statin she is reluctant to do it but she said she will think it over. 4. Preop evaluation before right hip replacement surgery.  Echocardiogram and stress test will be done to assess the risks.    Medication Adjustments/Labs and Tests Ordered: Current medicines are reviewed at length with the patient today.  Concerns regarding medicines are outlined above.  No orders of the defined types were placed in this encounter.  Medication changes: No orders of the defined types were placed in this encounter.   Signed, Georgeanna Lea, MD, [facility] 04/09/2017 2:04 PM    [facility]

## 2017-04-09 NOTE — Patient Instructions (Signed)
Medication Instructions:  Your physician recommends that you continue on your current medications as directed. Please refer to the Current Medication list given to you today.  1. Avoid all over-the-counter antihistamines except Claritin/Loratadine and Zyrtec/Cetrizine. 2. Avoid all combination including cold sinus allergies flu decongestant and sleep medications 3. You can use Robitussin DM Mucinex and Mucinex DM for cough. 4. can use Tylenol aspirin ibuprofen and naproxen but no combinations such as sleep or sinus.  Labwork: None   Testing/Procedures: Your physician has requested that you have a lexiscan myoview. For further information please visit [email or web address] Please follow instruction sheet, as given.  Your physician has requested that you have an echocardiogram. Echocardiography is a painless test that uses sound waves to create images of your heart. It provides your doctor with information about the size and shape of your heart and how well your heart's chambers and valves are working. This procedure takes approximately one hour. There are no restrictions for this procedure.  Please report to [address] [place], [place] the day of your testing.   Follow-Up: Your physician recommends that you schedule a follow-up appointment in: 3 months   Any Other Special Instructions Will Be Listed Below (If Applicable).  Please note that any paperwork needing to be filled out by the provider will need to be addressed at the front desk prior to seeing the provider. Please note that any paperwork FMLA, Disability or other documents regarding health condition is subject to a $25.00 charge that must be received prior to completion of paperwork in the form of a money order or check.     If you need a refill on your cardiac medications before your next appointment, please call your pharmacy.

## 2017-04-15 ENCOUNTER — Telehealth (HOSPITAL_COMMUNITY): Payer: Self-pay | Admitting: *Deleted

## 2017-04-15 NOTE — Telephone Encounter (Signed)
Left message on voicemail per DPR in reference to upcoming appointment scheduled on 04/18/17 at 1100 with detailed instructions given per Myocardial Perfusion Study Information Sheet for the test. LM to arrive 15 minutes early, and that it is imperative to arrive on time for appointment to keep from having the test rescheduled. If you need to cancel or reschedule your appointment, please call the office within 24 hours of your appointment. Failure to do so may result in a cancellation of your appointment, and a $50 no show fee. Phone number given for call back for any questions.

## 2017-04-15 NOTE — Progress Notes (Signed)
11-13-16 (EPIC) EKG, CXR

## 2017-04-15 NOTE — Patient Instructions (Signed)
Ashley Ritter  04/15/2017   Your procedure is scheduled on: 04-23-17   Report to [facility]  Entrance Report to Admitting at 9:30 AM   Call this number if you have problems the morning of surgery  [phone]   Remember: ONLY 1 PERSON MAY GO WITH YOU TO SHORT STAY TO GET  READY MORNING OF YOUR SURGERY.  Do not eat food or drink liquids :After Midnight.     Take these medicines the morning of surgery with A SIP OF WATER: None                                You may not have any metal on your body including hair pins and              piercings  Do not wear jewelry, make-up, lotions, powders or perfumes, deodorant             Do not wear nail polish.  Do not shave  48 hours prior to surgery.               Do not bring valuables to the hospital. [facility] IS NOT             RESPONSIBLE   FOR VALUABLES.  Contacts, dentures or bridgework may not be worn into surgery.  Leave suitcase in the car. After surgery it may be brought to your room.                 Please read over the following fact sheets you were given: _____________________________________________________________________             [facility] - Preparing for Surgery Before surgery, you can play an important role.  Because skin is not sterile, your skin needs to be as free of germs as possible.  You can reduce the number of germs on your skin by washing with CHG (chlorahexidine gluconate) soap before surgery.  CHG is an antiseptic cleaner which kills germs and bonds with the skin to continue killing germs even after washing. Please DO NOT use if you have an allergy to CHG or antibacterial soaps.  If your skin becomes reddened/irritated stop using the CHG and inform your nurse when you arrive at Short Stay. Do not shave (including legs and underarms) for at least 48 hours prior to the first CHG shower.  You may shave your face/neck. Please follow these instructions carefully:  1.  Shower  with CHG Soap the night before surgery and the  morning of Surgery.  2.  If you choose to wash your hair, wash your hair first as usual with your  normal  shampoo.  3.  After you shampoo, rinse your hair and body thoroughly to remove the  shampoo.                           4.  Use CHG as you would any other liquid soap.  You can apply chg directly  to the skin and wash                       Gently with a scrungie or clean washcloth.  5.  Apply the CHG Soap to your body ONLY FROM THE NECK DOWN.   Do not use  on face/ open                           Wound or open sores. Avoid contact with eyes, ears mouth and genitals (private parts).                       Wash face,  Genitals (private parts) with your normal soap.             6.  Wash thoroughly, paying special attention to the area where your surgery  will be performed.  7.  Thoroughly rinse your body with warm water from the neck down.  8.  DO NOT shower/wash with your normal soap after using and rinsing off  the CHG Soap.                9.  Pat yourself dry with a clean towel.            10.  Wear clean pajamas.            11.  Place clean sheets on your bed the night of your first shower and do not  sleep with pets. Day of Surgery : Do not apply any lotions/deodorants the morning of surgery.  Please wear clean clothes to the hospital/surgery center.  FAILURE TO FOLLOW THESE INSTRUCTIONS MAY RESULT IN THE CANCELLATION OF YOUR SURGERY PATIENT SIGNATURE_________________________________  NURSE SIGNATURE__________________________________  ________________________________________________________________________   Adam Phenix  An incentive spirometer is a tool that can help keep your lungs clear and active. This tool measures how well you are filling your lungs with each breath. Taking long deep breaths may help reverse or decrease the chance of developing breathing (pulmonary) problems (especially infection) following:  A long period  of time when you are unable to move or be active. BEFORE THE PROCEDURE   If the spirometer includes an indicator to show your best effort, your nurse or respiratory therapist will set it to a desired goal.  If possible, sit up straight or lean slightly forward. Try not to slouch.  Hold the incentive spirometer in an upright position. INSTRUCTIONS FOR USE  1. Sit on the edge of your bed if possible, or sit up as far as you can in bed or on a chair. 2. Hold the incentive spirometer in an upright position. 3. Breathe out normally. 4. Place the mouthpiece in your mouth and seal your lips tightly around it. 5. Breathe in slowly and as deeply as possible, raising the piston or the ball toward the top of the column. 6. Hold your breath for 3-5 seconds or for as long as possible. Allow the piston or ball to fall to the bottom of the column. 7. Remove the mouthpiece from your mouth and breathe out normally. 8. Rest for a few seconds and repeat Steps 1 through 7 at least 10 times every 1-2 hours when you are awake. Take your time and take a few normal breaths between deep breaths. 9. The spirometer may include an indicator to show your best effort. Use the indicator as a goal to work toward during each repetition. 10. After each set of 10 deep breaths, practice coughing to be sure your lungs are clear. If you have an incision (the cut made at the time of surgery), support your incision when coughing by placing a pillow or rolled up towels firmly against it. Once you are able to get out of bed, walk around  indoors and cough well. You may stop using the incentive spirometer when instructed by your caregiver.  RISKS AND COMPLICATIONS  Take your time so you do not get dizzy or light-headed.  If you are in pain, you may need to take or ask for pain medication before doing incentive spirometry. It is harder to take a deep breath if you are having pain. AFTER USE  Rest and breathe slowly and easily.  It  can be helpful to keep track of a log of your progress. Your caregiver can provide you with a simple table to help with this. If you are using the spirometer at home, follow these instructions: [place] IF:   You are having difficultly using the spirometer.  You have trouble using the spirometer as often as instructed.  Your pain medication is not giving enough relief while using the spirometer.  You develop fever of 100.5 F (38.1 C) or higher. SEEK IMMEDIATE MEDICAL CARE IF:   You cough up bloody sputum that had not been present before.  You develop fever of 102 F (38.9 C) or greater.  You develop worsening pain at or near the incision site. MAKE SURE YOU:   Understand these instructions.  Will watch your condition.  Will get help right away if you are not doing well or get worse. Document Released: 10/07/2006 Document Revised: 08/19/2011 Document Reviewed: 12/08/2006 Exit[employer] Patient Information 2014 [employer], [place].   ________________________________________________________________________  WHAT IS A BLOOD TRANSFUSION? Blood Transfusion Information  A transfusion is the replacement of blood or some of its parts. Blood is made up of multiple cells which provide different functions.  Red blood cells carry oxygen and are used for blood loss replacement.  White blood cells fight against infection.  Platelets control bleeding.  Plasma helps clot blood.  Other blood products are available for specialized needs, such as hemophilia or other clotting disorders. BEFORE THE TRANSFUSION  Who gives blood for transfusions?   Healthy volunteers who are fully evaluated to make sure their blood is safe. This is blood bank blood. Transfusion therapy is the safest it has ever been in the practice of medicine. Before blood is taken from a donor, a complete history is taken to make sure that person has no history of diseases nor engages in risky social behavior (examples  are intravenous drug use or sexual activity with multiple partners). The donor's travel history is screened to minimize risk of transmitting infections, such as malaria. The donated blood is tested for signs of infectious diseases, such as HIV and hepatitis. The blood is then tested to be sure it is compatible with you in order to minimize the chance of a transfusion reaction. If you or a relative donates blood, this is often done in anticipation of surgery and is not appropriate for emergency situations. It takes many days to process the donated blood. RISKS AND COMPLICATIONS Although transfusion therapy is very safe and saves many lives, the main dangers of transfusion include:   Getting an infectious disease.  Developing a transfusion reaction. This is an allergic reaction to something in the blood you were given. Every precaution is taken to prevent this. The decision to have a blood transfusion has been considered carefully by your caregiver before blood is given. Blood is not given unless the benefits outweigh the risks. AFTER THE TRANSFUSION  Right after receiving a blood transfusion, you will usually feel much better and more energetic. This is especially true if your red blood cells have gotten  low (anemic). The transfusion raises the level of the red blood cells which carry oxygen, and this usually causes an energy increase.  The nurse administering the transfusion will monitor you carefully for complications. HOME CARE INSTRUCTIONS  No special instructions are needed after a transfusion. You may find your energy is better. Speak with your caregiver about any limitations on activity for underlying diseases you may have. SEEK MEDICAL CARE IF:   Your condition is not improving after your transfusion.  You develop redness or irritation at the intravenous (IV) site. SEEK IMMEDIATE MEDICAL CARE IF:  Any of the following symptoms occur over the next 12 hours:  Shaking chills.  You have a  temperature by mouth above 102 F (38.9 C), not controlled by medicine.  Chest, back, or muscle pain.  People around you feel you are not acting correctly or are confused.  Shortness of breath or difficulty breathing.  Dizziness and fainting.  You get a rash or develop hives.  You have a decrease in urine output.  Your urine turns a dark color or changes to pink, red, or brown. Any of the following symptoms occur over the next 10 days:  You have a temperature by mouth above 102 F (38.9 C), not controlled by medicine.  Shortness of breath.  Weakness after normal activity.  The white part of the eye turns yellow (jaundice).  You have a decrease in the amount of urine or are urinating less often.  Your urine turns a dark color or changes to pink, red, or brown. Document Released: 05/24/2000 Document Revised: 08/19/2011 Document Reviewed: 01/11/2008 [facility] Patient Information 2014 [place], [place].  _______________________________________________________________________

## 2017-04-17 ENCOUNTER — Other Ambulatory Visit: Payer: Self-pay

## 2017-04-17 ENCOUNTER — Encounter (HOSPITAL_COMMUNITY)
Admission: RE | Admit: 2017-04-17 | Discharge: 2017-04-17 | Disposition: A | Discharge status: Home / Self Care | Payer: Medicare Other | Source: Ambulatory Visit | Attending: Orthopedic Surgery | Admitting: Orthopedic Surgery

## 2017-04-17 ENCOUNTER — Encounter (HOSPITAL_COMMUNITY): Payer: Self-pay

## 2017-04-17 DIAGNOSIS — I739 Peripheral vascular disease, unspecified: Secondary | ICD-10-CM | POA: Insufficient documentation

## 2017-04-17 DIAGNOSIS — R42 Dizziness and giddiness: Secondary | ICD-10-CM | POA: Diagnosis not present

## 2017-04-17 DIAGNOSIS — R079 Chest pain, unspecified: Secondary | ICD-10-CM | POA: Insufficient documentation

## 2017-04-17 DIAGNOSIS — I10 Essential (primary) hypertension: Secondary | ICD-10-CM | POA: Diagnosis not present

## 2017-04-17 DIAGNOSIS — M1611 Unilateral primary osteoarthritis, right hip: Secondary | ICD-10-CM | POA: Insufficient documentation

## 2017-04-17 DIAGNOSIS — Z01818 Encounter for other preprocedural examination: Secondary | ICD-10-CM | POA: Diagnosis not present

## 2017-04-17 DIAGNOSIS — E785 Hyperlipidemia, unspecified: Secondary | ICD-10-CM | POA: Insufficient documentation

## 2017-04-17 LAB — CBC
HCT: 30.3 % — ABNORMAL LOW (ref 36.0–46.0)
Hemoglobin: 9.5 g/dL — ABNORMAL LOW (ref 12.0–15.0)
MCH: 27.3 pg (ref 26.0–34.0)
MCHC: 31.4 g/dL (ref 30.0–36.0)
MCV: 87.1 fL (ref 78.0–100.0)
Platelets: 340 10*3/uL (ref 150–400)
RBC: 3.48 MIL/uL — ABNORMAL LOW (ref 3.87–5.11)
RDW: 15.2 % (ref 11.5–15.5)
WBC: 8.1 10*3/uL (ref 4.0–10.5)

## 2017-04-17 LAB — COMPREHENSIVE METABOLIC PANEL
ALBUMIN: 3.7 g/dL (ref 3.5–5.0)
ALK PHOS: 65 U/L (ref 38–126)
ALT: 9 U/L — AB (ref 14–54)
AST: 14 U/L — AB (ref 15–41)
Anion gap: 7 (ref 5–15)
BILIRUBIN TOTAL: 0.7 mg/dL (ref 0.3–1.2)
BUN: 15 mg/dL (ref 6–20)
CALCIUM: 9.2 mg/dL (ref 8.9–10.3)
CO2: 29 mmol/L (ref 22–32)
CREATININE: 0.98 mg/dL (ref 0.44–1.00)
Chloride: 105 mmol/L (ref 101–111)
GFR calc Af Amer: >60 mL/min (ref >60)
GFR calc non Af Amer: 55 mL/min — ABNORMAL LOW (ref >60)
GLUCOSE: 99 mg/dL (ref 65–99)
Potassium: 4.2 mmol/L (ref 3.5–5.1)
SODIUM: 141 mmol/L (ref 135–145)
TOTAL PROTEIN: 7.7 g/dL (ref 6.5–8.1)

## 2017-04-17 LAB — PROTIME-INR
INR: 0.96
Prothrombin Time: 12.7 seconds (ref 11.4–15.2)

## 2017-04-17 LAB — APTT: APTT: 34 s (ref 24–36)

## 2017-04-17 NOTE — Progress Notes (Addendum)
04-17-17 CBC results routed to Dr. Lequita Halt for review.  04-18-18 Cardiac Clearance from Dr. Bing Matter on chart

## 2017-04-18 ENCOUNTER — Other Ambulatory Visit: Payer: Self-pay

## 2017-04-18 ENCOUNTER — Ambulatory Visit (HOSPITAL_BASED_OUTPATIENT_CLINIC_OR_DEPARTMENT_OTHER): Payer: Medicare Other

## 2017-04-18 ENCOUNTER — Ambulatory Visit: Payer: Self-pay | Admitting: Orthopedic Surgery

## 2017-04-18 DIAGNOSIS — I739 Peripheral vascular disease, unspecified: Secondary | ICD-10-CM

## 2017-04-18 DIAGNOSIS — I10 Essential (primary) hypertension: Secondary | ICD-10-CM

## 2017-04-18 DIAGNOSIS — E785 Hyperlipidemia, unspecified: Secondary | ICD-10-CM | POA: Diagnosis not present

## 2017-04-18 DIAGNOSIS — M1611 Unilateral primary osteoarthritis, right hip: Secondary | ICD-10-CM | POA: Diagnosis not present

## 2017-04-18 DIAGNOSIS — Z01818 Encounter for other preprocedural examination: Secondary | ICD-10-CM

## 2017-04-18 DIAGNOSIS — R079 Chest pain, unspecified: Secondary | ICD-10-CM | POA: Diagnosis not present

## 2017-04-18 LAB — SURGICAL PCR SCREEN
MRSA, PCR: POSITIVE — AB
STAPHYLOCOCCUS AUREUS: POSITIVE — AB

## 2017-04-18 LAB — MYOCARDIAL PERFUSION IMAGING
CHL CUP NUCLEAR SDS: 1
CHL CUP NUCLEAR SRS: 3
CHL CUP RESTING HR STRESS: 57 {beats}/min
CSEPPHR: 99 {beats}/min
LV dias vol: 131 mL (ref 46–106)
LV sys vol: 73 mL
RATE: 0.34
SSS: 4
TID: 1.08

## 2017-04-18 MED ORDER — TECHNETIUM TC 99M TETROFOSMIN IV KIT
30.4000 | PACK | Freq: Once | INTRAVENOUS | Status: AC | PRN
  Administered 2017-04-18: 30.4 via INTRAVENOUS
  Filled 2017-04-18: qty 31

## 2017-04-18 MED ORDER — PERFLUTREN LIPID MICROSPHERE
1.0000 mL | INTRAVENOUS | Status: AC | PRN
  Administered 2017-04-18: 2 mL via INTRAVENOUS

## 2017-04-18 MED ORDER — REGADENOSON 0.4 MG/5ML IV SOLN
0.4000 mg | Freq: Once | INTRAVENOUS | Status: AC
  Administered 2017-04-18: 0.4 mg via INTRAVENOUS

## 2017-04-18 MED ORDER — TECHNETIUM TC 99M TETROFOSMIN IV KIT
10.2000 | PACK | Freq: Once | INTRAVENOUS | Status: AC | PRN
  Administered 2017-04-18: 10.2 via INTRAVENOUS
  Filled 2017-04-18: qty 11

## 2017-04-18 MED ORDER — VANCOMYCIN HCL 10 G IV SOLR: 1000.0000 mg | Freq: Once | INTRAVENOUS | Status: DC

## 2017-04-18 NOTE — H&P (View-Only) (Signed)
IV Vancomycin was added to the preop orders for a MRSA positive nasal swab. Avel Peace, PA-C

## 2017-04-18 NOTE — Progress Notes (Signed)
IV Vancomycin was added to the preop orders for a MRSA positive nasal swab. Drew Perkins, PA-C  

## 2017-04-20 ENCOUNTER — Ambulatory Visit: Payer: Self-pay | Admitting: Orthopedic Surgery

## 2017-04-20 NOTE — H&P (Deleted)
  The note originally documented on this encounter has been moved the the encounter in which it belongs.  

## 2017-04-20 NOTE — H&P (Signed)
Ashley Ritter DOB: 1941/05/24 Married / Language: English / Race: Black or African American Female Date of Admission:  04/23/2017 CC:  Right hip pain History of Present Illness  The patient is a 76 year old female who comes in  for a preoperative History and Physical. The patient is scheduled for a right total hip arthroplasty (anterior) to be performed by Dr. Gus Rankin. Aluisio, MD at [facility] on 04-23-2017. The patient is a 76 year old female who presented with a hip problem. The patient reports right hip problems including pain and catching symptoms that have been present for 2 year(s) (with a fall May of 2018). The symptoms began following a specific injury. Symptoms reported include hip pain and pain with weightbearing The patient describes the hip problem as sharp, dull and aching. Onset of symptoms was with symptoms now occuring intermittently.The symptoms are described as severe (at times especially at night and climbing stairs). The patient feels as if their symptoms are does feel they are worsening. When she fell, she ruptured her intestines and spent several months in the hospital. She did have [facility] but they are unavailable for review. Her hip is now bothering her at all times. Pain is in the groin radiating down the anterior thigh. She has lost a lot of motion in her RIGHT hip. It is limiting what she can and cannot do. She is having a much hard time getting up and down and very hard time walking. She has some discomfort on the LEFT but the RIGHT is the thing that is hurting her the most. It is keeping her awake at night now also. 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 She has advanced end-stage arthritis of the RIGHT hip with significant pain and dysfunction.The patient has significant pain and dysfunction in their hip. At  this point, the most predictable means of improving pain and function is total hip arthroplasty. The procedure, risks, potential complications and rehab course are discussed in detail and the patient elects to proceed. They have been treated conservatively in the past for the above stated problem and despite conservative measures, they continue to have progressive pain and severe functional limitations and dysfunction. They have failed non-operative management including home exercise, medications. It is felt that they would benefit from undergoing total joint replacement. Risks and benefits of the procedure have been discussed with the patient and they elect to proceed with surgery. There are no active contraindications to surgery such as ongoing infection or rapidly progressive neurological disease.  Problem List/Past Medical  Primary osteoarthritis of right hip (M16.11)  Bleeding disorder  Diverticulitis Of Colon  High blood pressure  Lumbago (M54.5) [04/15/2000]: History of Blood Clot   Allergies  PENICILLIN [04/15/2000]: Tongue Swelling OxyCODONE HCl *ANALGESICS - OPIOID*  Itching.  Family History Congestive Heart Failure  father Heart Disease  father Heart disease in female family member before age 34  Hypertension  father  Social History  Alcohol use  never consumed alcohol Children  4 Current work status  retired Financial planner (Currently)  no Drug/Alcohol Rehab (Previously)  no Exercise  Exercises rarely; does running / walking Illicit drug use  no Living situation  live alone Marital status  widowed Pain Contract  no Tobacco / smoke exposure  no Tobacco use  former smoker; smoke(d) 1 pack(s) per day  Medication History  Tamsulosin HCl (0.4MG  Capsule, Oral) Active. Dyazide (37.5-25MG  Capsule, Oral) Active. Aspirin 81 mg Active.  Past Surgical History  Rotator Cuff Repair  right  Review of Systems  General Not Present- Chills, Fatigue,  Fever, Memory Loss, Night Sweats, Weight Gain and Weight Loss. Skin Not Present- Eczema, Hives, Itching, Lesions and Rash. HEENT Not Present- Dentures, Double Vision, Headache, Hearing Loss, Tinnitus and Visual Loss. Respiratory Not Present- Allergies, Chronic Cough, Coughing up blood, Shortness of breath at rest and Shortness of breath with exertion. Cardiovascular Not Present- Chest Pain, Difficulty Breathing Lying Down, Murmur, Palpitations, Racing/skipping heartbeats and Swelling. Gastrointestinal Not Present- Abdominal Pain, Bloody Stool, Constipation, Diarrhea, Difficulty Swallowing, Heartburn, Jaundice, Loss of appetitie, Nausea and Vomiting. Female Genitourinary Not Present- Blood in Urine, Discharge, Flank Pain, Incontinence, Painful Urination, Urgency, Urinary frequency, Urinary Retention, Urinating at Night and Weak urinary stream. Musculoskeletal Present- Joint Pain. Not Present- Back Pain, Joint Swelling, Morning Stiffness, Muscle Pain, Muscle Weakness and Spasms. Neurological Not Present- Blackout spells, Difficulty with balance, Dizziness, Paralysis, Tremor and Weakness. Psychiatric Not Present- Insomnia.  Vitals  Weight: 185 lb Height: 61in Body Surface Area: 1.83 m Body Mass Index: 34.96 kg/m  Pulse: 64 (Regular)  BP: 126/68 (Sitting, Right Arm, Standard)  Physical Exam  General Mental Status -Alert, cooperative and good historian. General Appearance-pleasant, Not in acute distress. Orientation-Oriented X3. Build & Nutrition-Well nourished and Well developed.  Head and Neck Head-normocephalic, atraumatic . Neck Global Assessment - bruit auscultated on the left(left neck and into the left shoulder), supple, no bruit auscultated on the right.  Eye Pupil - Bilateral-Regular and Round. Motion - Bilateral-EOMI.  ENMT Note: lower denture plate  Chest and Lung Exam Auscultation Breath sounds - clear at anterior chest wall and clear at  posterior chest wall. Adventitious sounds - No Adventitious sounds.  Cardiovascular Auscultation Rhythm - Regular rate and rhythm. Heart Sounds - S1 WNL and S2 WNL. Murmurs & Other Heart Sounds - Auscultation of the heart reveals - No Murmurs.  Abdomen Palpation/Percussion Tenderness - Abdomen is non-tender to palpation. Rigidity (guarding) - Abdomen is soft. Auscultation Auscultation of the abdomen reveals - Bowel sounds normal.  Female Genitourinary Note: Not done, not pertinent to present illness   Musculoskeletal Note: Her LEFT hip can be flexed to 100 rotated into and out 30 and abduct 30 without discomfort. RIGHT hip flexion 90 with no internal or external rotation only about 5 of abduction. She has a significantly antalgic gait pattern on the RIGHT. Pulses sensation and motor are intact.  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  Assessment & Plan  Primary osteoarthritis of right hip (M16.11)  Note:Surgical Plans: Right Total Hip Replacement - Anterior Approach  Disposition: Home  PCP: Dr. Marina GoodellPerry - Patient has been seen preoperatively and felt to be stable for surgery. Cards: Dr. Kirtland BouchardK - pending at time of H&P appointment  Topical TXA  Anesthesia Issues: none  Patient was instructed on what medications to stop prior to surgery.  Signed electronically by Lauraine RinneAlexzandrew L Mckynzi Cammon, III PA-C

## 2017-04-23 ENCOUNTER — Inpatient Hospital Stay (HOSPITAL_COMMUNITY): Payer: Medicare Other

## 2017-04-23 ENCOUNTER — Encounter (HOSPITAL_COMMUNITY)
Admission: RE | Disposition: A | Discharge status: Skilled Nursing Facility | Payer: Self-pay | Source: Ambulatory Visit | Attending: Orthopedic Surgery

## 2017-04-23 ENCOUNTER — Inpatient Hospital Stay (HOSPITAL_COMMUNITY): Payer: Medicare Other | Admitting: Anesthesiology

## 2017-04-23 ENCOUNTER — Other Ambulatory Visit: Payer: Self-pay

## 2017-04-23 ENCOUNTER — Encounter (HOSPITAL_COMMUNITY): Payer: Self-pay

## 2017-04-23 ENCOUNTER — Inpatient Hospital Stay (HOSPITAL_COMMUNITY)
Admission: RE | Admit: 2017-04-23 | Discharge: 2017-04-26 | DRG: 470 | Disposition: A | Discharge status: Skilled Nursing Facility | Payer: Medicare Other | Source: Ambulatory Visit | Attending: Orthopedic Surgery | Admitting: Orthopedic Surgery

## 2017-04-23 DIAGNOSIS — D649 Anemia, unspecified: Secondary | ICD-10-CM | POA: Diagnosis not present

## 2017-04-23 DIAGNOSIS — Z96643 Presence of artificial hip joint, bilateral: Secondary | ICD-10-CM | POA: Diagnosis not present

## 2017-04-23 DIAGNOSIS — Z96649 Presence of unspecified artificial hip joint: Secondary | ICD-10-CM

## 2017-04-23 DIAGNOSIS — Z880 Allergy status to penicillin: Secondary | ICD-10-CM

## 2017-04-23 DIAGNOSIS — I10 Essential (primary) hypertension: Secondary | ICD-10-CM | POA: Diagnosis present

## 2017-04-23 DIAGNOSIS — Z87891 Personal history of nicotine dependence: Secondary | ICD-10-CM | POA: Diagnosis not present

## 2017-04-23 DIAGNOSIS — Z471 Aftercare following joint replacement surgery: Secondary | ICD-10-CM | POA: Diagnosis not present

## 2017-04-23 DIAGNOSIS — E785 Hyperlipidemia, unspecified: Secondary | ICD-10-CM | POA: Diagnosis present

## 2017-04-23 DIAGNOSIS — Z96641 Presence of right artificial hip joint: Secondary | ICD-10-CM | POA: Diagnosis not present

## 2017-04-23 DIAGNOSIS — M1611 Unilateral primary osteoarthritis, right hip: Secondary | ICD-10-CM | POA: Diagnosis not present

## 2017-04-23 DIAGNOSIS — Z7982 Long term (current) use of aspirin: Secondary | ICD-10-CM

## 2017-04-23 DIAGNOSIS — M25551 Pain in right hip: Secondary | ICD-10-CM

## 2017-04-23 DIAGNOSIS — R0789 Other chest pain: Secondary | ICD-10-CM | POA: Diagnosis not present

## 2017-04-23 DIAGNOSIS — Z4789 Encounter for other orthopedic aftercare: Secondary | ICD-10-CM | POA: Diagnosis not present

## 2017-04-23 DIAGNOSIS — S79911A Unspecified injury of right hip, initial encounter: Secondary | ICD-10-CM | POA: Diagnosis not present

## 2017-04-23 DIAGNOSIS — M169 Osteoarthritis of hip, unspecified: Secondary | ICD-10-CM

## 2017-04-23 DIAGNOSIS — R0989 Other specified symptoms and signs involving the circulatory and respiratory systems: Secondary | ICD-10-CM | POA: Diagnosis not present

## 2017-04-23 DIAGNOSIS — Z885 Allergy status to narcotic agent status: Secondary | ICD-10-CM | POA: Diagnosis not present

## 2017-04-23 DIAGNOSIS — Z22322 Carrier or suspected carrier of Methicillin resistant Staphylococcus aureus: Secondary | ICD-10-CM

## 2017-04-23 DIAGNOSIS — R262 Difficulty in walking, not elsewhere classified: Secondary | ICD-10-CM | POA: Diagnosis not present

## 2017-04-23 DIAGNOSIS — G8918 Other acute postprocedural pain: Secondary | ICD-10-CM | POA: Diagnosis not present

## 2017-04-23 HISTORY — PX: TOTAL HIP ARTHROPLASTY: SHX124

## 2017-04-23 HISTORY — DX: Osteoarthritis of hip, unspecified: M16.9

## 2017-04-23 LAB — TYPE AND SCREEN
ABO/RH(D): B POS
ANTIBODY SCREEN: NEGATIVE

## 2017-04-23 LAB — ABO/RH: ABO/RH(D): B POS

## 2017-04-23 SURGERY — ARTHROPLASTY, HIP, TOTAL, ANTERIOR APPROACH
Anesthesia: Spinal | Site: Hip | Laterality: Right

## 2017-04-23 MED ORDER — HYDROMORPHONE HCL 2 MG PO TABS
4.0000 mg | ORAL_TABLET | ORAL | Status: DC | PRN
  Administered 2017-04-25 (×3): 4 mg via ORAL
  Filled 2017-04-23 (×6): qty 2

## 2017-04-23 MED ORDER — RIVAROXABAN 10 MG PO TABS
10.0000 mg | ORAL_TABLET | Freq: Every day | ORAL | Status: DC
  Administered 2017-04-24 – 2017-04-26 (×3): 10 mg via ORAL
  Filled 2017-04-23 (×3): qty 1

## 2017-04-23 MED ORDER — LACTATED RINGERS IV SOLN
INTRAVENOUS | Status: DC
  Administered 2017-04-23: 1000 mL via INTRAVENOUS

## 2017-04-23 MED ORDER — BUPIVACAINE-EPINEPHRINE (PF) 0.25% -1:200000 IJ SOLN
INTRAMUSCULAR | Status: AC
  Filled 2017-04-23: qty 30

## 2017-04-23 MED ORDER — DEXAMETHASONE SODIUM PHOSPHATE 10 MG/ML IJ SOLN
10.0000 mg | Freq: Once | INTRAMUSCULAR | Status: AC
  Administered 2017-04-24: 10:00:00 10 mg via INTRAVENOUS
  Filled 2017-04-23: qty 1

## 2017-04-23 MED ORDER — MENTHOL 3 MG MT LOZG: 1.0000 | LOZENGE | OROMUCOSAL | Status: DC | PRN

## 2017-04-23 MED ORDER — POLYETHYLENE GLYCOL 3350 17 G PO PACK: 17.0000 g | PACK | Freq: Every day | ORAL | Status: DC | PRN

## 2017-04-23 MED ORDER — TRAMADOL HCL 50 MG PO TABS: 50.0000 mg | ORAL_TABLET | Freq: Four times a day (QID) | ORAL | Status: DC | PRN

## 2017-04-23 MED ORDER — FLEET ENEMA 7-19 GM/118ML RE ENEM: 1.0000 | ENEMA | Freq: Once | RECTAL | Status: DC | PRN

## 2017-04-23 MED ORDER — ACETAMINOPHEN 10 MG/ML IV SOLN
INTRAVENOUS | Status: AC
  Filled 2017-04-23: qty 100

## 2017-04-23 MED ORDER — DOCUSATE SODIUM 100 MG PO CAPS
100.0000 mg | ORAL_CAPSULE | Freq: Two times a day (BID) | ORAL | Status: DC
  Administered 2017-04-23 – 2017-04-26 (×6): 100 mg via ORAL
  Filled 2017-04-23 (×6): qty 1

## 2017-04-23 MED ORDER — FENTANYL CITRATE (PF) 100 MCG/2ML IJ SOLN
INTRAMUSCULAR | Status: DC | PRN
  Administered 2017-04-23 (×2): 50 ug via INTRAVENOUS

## 2017-04-23 MED ORDER — PHENOL 1.4 % MT LIQD: 1.0000 | OROMUCOSAL | Status: DC | PRN

## 2017-04-23 MED ORDER — VANCOMYCIN HCL IN DEXTROSE 1-5 GM/200ML-% IV SOLN
1000.0000 mg | Freq: Once | INTRAVENOUS | Status: AC
  Administered 2017-04-24: 1000 mg via INTRAVENOUS
  Filled 2017-04-23: qty 200

## 2017-04-23 MED ORDER — PROMETHAZINE HCL 25 MG/ML IJ SOLN: 6.2500 mg | INTRAMUSCULAR | Status: DC | PRN

## 2017-04-23 MED ORDER — HYDROMORPHONE HCL 1 MG/ML IJ SOLN
0.2500 mg | INTRAMUSCULAR | Status: DC | PRN
  Administered 2017-04-23 (×3): 0.5 mg via INTRAVENOUS

## 2017-04-23 MED ORDER — ACETAMINOPHEN 650 MG RE SUPP: 650.0000 mg | RECTAL | Status: DC | PRN

## 2017-04-23 MED ORDER — TRANEXAMIC ACID 1000 MG/10ML IV SOLN
2000.0000 mg | Freq: Once | INTRAVENOUS | Status: DC
  Filled 2017-04-23: qty 20

## 2017-04-23 MED ORDER — HYDROMORPHONE HCL 1 MG/ML IJ SOLN
INTRAMUSCULAR | Status: AC
  Filled 2017-04-23: qty 2

## 2017-04-23 MED ORDER — METHOCARBAMOL 500 MG PO TABS
500.0000 mg | ORAL_TABLET | Freq: Four times a day (QID) | ORAL | Status: DC | PRN
  Administered 2017-04-23: 23:00:00 500 mg via ORAL
  Filled 2017-04-23: qty 1

## 2017-04-23 MED ORDER — ACETAMINOPHEN 10 MG/ML IV SOLN
1000.0000 mg | Freq: Once | INTRAVENOUS | Status: AC
  Administered 2017-04-23: 1000 mg via INTRAVENOUS

## 2017-04-23 MED ORDER — ONDANSETRON HCL 4 MG/2ML IJ SOLN
INTRAMUSCULAR | Status: DC | PRN
  Administered 2017-04-23: 4 mg via INTRAVENOUS

## 2017-04-23 MED ORDER — METHOCARBAMOL 1000 MG/10ML IJ SOLN
500.0000 mg | Freq: Four times a day (QID) | INTRAVENOUS | Status: DC | PRN
  Filled 2017-04-23: qty 5

## 2017-04-23 MED ORDER — VANCOMYCIN HCL IN DEXTROSE 1-5 GM/200ML-% IV SOLN
INTRAVENOUS | Status: AC
  Filled 2017-04-23: qty 200

## 2017-04-23 MED ORDER — TRANEXAMIC ACID 1000 MG/10ML IV SOLN
INTRAVENOUS | Status: AC | PRN
  Administered 2017-04-23: 2000 mg via TOPICAL

## 2017-04-23 MED ORDER — ONDANSETRON HCL 4 MG/2ML IJ SOLN
4.0000 mg | Freq: Four times a day (QID) | INTRAMUSCULAR | Status: DC | PRN
  Administered 2017-04-23: 19:00:00 4 mg via INTRAVENOUS
  Filled 2017-04-23: qty 2

## 2017-04-23 MED ORDER — METOCLOPRAMIDE HCL 5 MG PO TABS: 5.0000 mg | ORAL_TABLET | Freq: Three times a day (TID) | ORAL | Status: DC | PRN

## 2017-04-23 MED ORDER — ONDANSETRON HCL 4 MG PO TABS: 4.0000 mg | ORAL_TABLET | Freq: Four times a day (QID) | ORAL | Status: DC | PRN

## 2017-04-23 MED ORDER — HYDROMORPHONE HCL 1 MG/ML IJ SOLN
0.5000 mg | INTRAMUSCULAR | Status: DC | PRN
  Administered 2017-04-23 – 2017-04-24 (×2): 0.5 mg via INTRAVENOUS
  Filled 2017-04-23 (×2): qty 0.5

## 2017-04-23 MED ORDER — DEXAMETHASONE SODIUM PHOSPHATE 10 MG/ML IJ SOLN
10.0000 mg | Freq: Once | INTRAMUSCULAR | Status: AC
  Administered 2017-04-23: 10 mg via INTRAVENOUS

## 2017-04-23 MED ORDER — CHLORHEXIDINE GLUCONATE 4 % EX LIQD: 60.0000 mL | Freq: Once | CUTANEOUS | Status: DC

## 2017-04-23 MED ORDER — BISACODYL 10 MG RE SUPP: 10.0000 mg | Freq: Every day | RECTAL | Status: DC | PRN

## 2017-04-23 MED ORDER — SODIUM CHLORIDE 0.9 % IV SOLN
INTRAVENOUS | Status: DC
  Administered 2017-04-23 – 2017-04-24 (×2): via INTRAVENOUS

## 2017-04-23 MED ORDER — ACETAMINOPHEN 500 MG PO TABS
1000.0000 mg | ORAL_TABLET | Freq: Four times a day (QID) | ORAL | Status: AC
  Administered 2017-04-23 – 2017-04-24 (×4): 1000 mg via ORAL
  Filled 2017-04-23 (×4): qty 2

## 2017-04-23 MED ORDER — PHENYLEPHRINE HCL 10 MG/ML IJ SOLN
INTRAVENOUS | Status: DC | PRN
  Administered 2017-04-23: 30 ug/min via INTRAVENOUS

## 2017-04-23 MED ORDER — PHENYLEPHRINE HCL 10 MG/ML IJ SOLN
INTRAMUSCULAR | Status: DC | PRN
  Administered 2017-04-23: 80 ug via INTRAVENOUS
  Administered 2017-04-23: 100 ug via INTRAVENOUS
  Administered 2017-04-23 (×2): 80 ug via INTRAVENOUS

## 2017-04-23 MED ORDER — ACETAMINOPHEN 325 MG PO TABS: 650.0000 mg | ORAL_TABLET | ORAL | Status: DC | PRN

## 2017-04-23 MED ORDER — VANCOMYCIN HCL IN DEXTROSE 1-5 GM/200ML-% IV SOLN
1000.0000 mg | INTRAVENOUS | Status: AC
  Administered 2017-04-23: 1000 mg via INTRAVENOUS

## 2017-04-23 MED ORDER — STERILE WATER FOR IRRIGATION IR SOLN
Status: DC | PRN
  Administered 2017-04-23: 2000 mL

## 2017-04-23 MED ORDER — PROPOFOL 500 MG/50ML IV EMUL
INTRAVENOUS | Status: DC | PRN
  Administered 2017-04-23: 75 ug/kg/min via INTRAVENOUS

## 2017-04-23 MED ORDER — BUPIVACAINE-EPINEPHRINE (PF) 0.25% -1:200000 IJ SOLN
INTRAMUSCULAR | Status: DC | PRN
  Administered 2017-04-23: 30 mL

## 2017-04-23 MED ORDER — MECLIZINE HCL 25 MG PO TABS: 12.5000 mg | ORAL_TABLET | Freq: Three times a day (TID) | ORAL | Status: DC | PRN

## 2017-04-23 MED ORDER — DIPHENHYDRAMINE HCL 12.5 MG/5ML PO ELIX: 12.5000 mg | ORAL_SOLUTION | ORAL | Status: DC | PRN

## 2017-04-23 MED ORDER — FENTANYL CITRATE (PF) 100 MCG/2ML IJ SOLN
INTRAMUSCULAR | Status: AC
  Filled 2017-04-23: qty 2

## 2017-04-23 MED ORDER — EPHEDRINE SULFATE 50 MG/ML IJ SOLN
INTRAMUSCULAR | Status: DC | PRN
  Administered 2017-04-23 (×2): 10 mg via INTRAVENOUS

## 2017-04-23 MED ORDER — TRIAMTERENE-HCTZ 37.5-25 MG PO CAPS
1.0000 | ORAL_CAPSULE | Freq: Every day | ORAL | Status: DC
  Administered 2017-04-25 – 2017-04-26 (×2): 1 via ORAL
  Filled 2017-04-23 (×5): qty 1

## 2017-04-23 MED ORDER — BUPIVACAINE HCL (PF) 0.75 % IJ SOLN
INTRAMUSCULAR | Status: DC | PRN
  Administered 2017-04-23: 2 mL via INTRATHECAL

## 2017-04-23 MED ORDER — METOCLOPRAMIDE HCL 5 MG/ML IJ SOLN: 5.0000 mg | Freq: Three times a day (TID) | INTRAMUSCULAR | Status: DC | PRN

## 2017-04-23 MED ORDER — HYDROMORPHONE HCL 2 MG PO TABS
2.0000 mg | ORAL_TABLET | ORAL | Status: DC | PRN
  Administered 2017-04-23 – 2017-04-25 (×4): 2 mg via ORAL
  Filled 2017-04-23 (×4): qty 1

## 2017-04-23 SURGICAL SUPPLY — 36 items
BAG DECANTER FOR FLEXI CONT (MISCELLANEOUS) ×3 IMPLANT
BAG SPEC THK2 15X12 ZIP CLS (MISCELLANEOUS) ×1
BAG ZIPLOCK 12X15 (MISCELLANEOUS) ×2 IMPLANT
BLADE SAG 18X100X1.27 (BLADE) ×3 IMPLANT
CAPT HIP TOTAL 2 ×2 IMPLANT
CLOSURE WOUND 1/2 X4 (GAUZE/BANDAGES/DRESSINGS) ×2
CLOTH BEACON ORANGE TIMEOUT ST (SAFETY) ×3 IMPLANT
COVER PERINEAL POST (MISCELLANEOUS) ×3 IMPLANT
COVER SURGICAL LIGHT HANDLE (MISCELLANEOUS) ×3 IMPLANT
DECANTER SPIKE VIAL GLASS SM (MISCELLANEOUS) ×1 IMPLANT
DRAPE STERI IOBAN 125X83 (DRAPES) ×3 IMPLANT
DRAPE U-SHAPE 47X51 STRL (DRAPES) ×6 IMPLANT
DRSG ADAPTIC 3X8 NADH LF (GAUZE/BANDAGES/DRESSINGS) ×3 IMPLANT
DRSG MEPILEX BORDER 4X4 (GAUZE/BANDAGES/DRESSINGS) ×3 IMPLANT
DRSG MEPILEX BORDER 4X8 (GAUZE/BANDAGES/DRESSINGS) ×3 IMPLANT
DURAPREP 26ML APPLICATOR (WOUND CARE) ×3 IMPLANT
ELECT REM PT RETURN 15FT ADLT (MISCELLANEOUS) ×3 IMPLANT
EVACUATOR 1/8 PVC DRAIN (DRAIN) ×3 IMPLANT
GLOVE BIO SURGEON STRL SZ7.5 (GLOVE) ×3 IMPLANT
GLOVE BIO SURGEON STRL SZ8 (GLOVE) ×4 IMPLANT
GLOVE BIOGEL PI IND STRL 8 (GLOVE) ×2 IMPLANT
GLOVE BIOGEL PI INDICATOR 8 (GLOVE) ×4
GOWN STRL REUS W/TWL LRG LVL3 (GOWN DISPOSABLE) ×3 IMPLANT
GOWN STRL REUS W/TWL XL LVL3 (GOWN DISPOSABLE) ×3 IMPLANT
PACK ANTERIOR HIP CUSTOM (KITS) ×3 IMPLANT
SLEEVE SURGEON STRL (DRAPES) ×2 IMPLANT
STRIP CLOSURE SKIN 1/2X4 (GAUZE/BANDAGES/DRESSINGS) ×3 IMPLANT
SUT ETHIBOND NAB CT1 #1 30IN (SUTURE) ×3 IMPLANT
SUT MNCRL AB 4-0 PS2 18 (SUTURE) ×3 IMPLANT
SUT STRATAFIX 0 PDS 27 VIOLET (SUTURE) ×3
SUT VIC AB 2-0 CT1 27 (SUTURE) ×6
SUT VIC AB 2-0 CT1 TAPERPNT 27 (SUTURE) ×2 IMPLANT
SUTURE STRATFX 0 PDS 27 VIOLET (SUTURE) ×1 IMPLANT
SYR 50ML LL SCALE MARK (SYRINGE) IMPLANT
TRAY FOLEY W/METER SILVER 16FR (SET/KITS/TRAYS/PACK) ×1 IMPLANT
YANKAUER SUCT BULB TIP 10FT TU (MISCELLANEOUS) ×3 IMPLANT

## 2017-04-23 NOTE — Transfer of Care (Signed)
Immediate Anesthesia Transfer of Care Note  Patient: Ashley Ritter  Procedure(s) Performed: RIGHT TOTAL HIP ARTHROPLASTY ANTERIOR APPROACH (Right Hip)  Patient Location: PACU  Anesthesia Type:Spinal  Level of Consciousness: awake, alert  and oriented  Airway & Oxygen Therapy: Patient Spontanous Breathing and Patient connected to face mask oxygen  Post-op Assessment: Report given to RN and Post -op Vital signs reviewed and stable  Post vital signs: Reviewed and stable  Last Vitals:  Vitals:   04/23/17 1017  BP: 139/62  Pulse: (!) 55  Resp: 16  Temp: 36.8 C  SpO2: 100%    Last Pain:  Vitals:   04/23/17 1017  TempSrc: Oral      Patients Stated Pain Goal: 4 (04/23/17 1041)  Complications: No apparent anesthesia complications

## 2017-04-23 NOTE — Interval H&P Note (Signed)
History and Physical Interval Note:  04/23/2017 11:06 AM  Ashley Ritter  has presented today for surgery, with the diagnosis of Osteoarthritis Right hip   The various methods of treatment have been discussed with the patient and family. After consideration of risks, benefits and other options for treatment, the patient has consented to  Procedure(s): RIGHT TOTAL HIP ARTHROPLASTY ANTERIOR APPROACH (Right) as a surgical intervention .  The patient's history has been reviewed, patient examined, no change in status, stable for surgery.  I have reviewed the patient's chart and labs.  Questions were answered to the patient's satisfaction.     Loanne Drilling

## 2017-04-23 NOTE — Anesthesia Preprocedure Evaluation (Signed)
Anesthesia Evaluation  Patient identified by MRN, date of birth, ID band Patient awake    Reviewed: Allergy & Precautions, NPO status , Patient's Chart, lab work & pertinent test results  Airway Mallampati: II  TM Distance: >3 FB Neck ROM: Full    Dental no notable dental hx.    Pulmonary neg pulmonary ROS, former smoker,    Pulmonary exam normal breath sounds clear to auscultation       Cardiovascular hypertension, Normal cardiovascular exam Rhythm:Regular Rate:Normal  Left ventricle: The cavity size was normal. Wall thickness was   normal. Systolic function was normal. The estimated ejection   fraction was in the range of 60% to 65%. Wall motion was normal;   there were no regional wall motion abnormalities. Doppler   parameters are consistent with abnormal left ventricular   relaxation (grade 1 diastolic dysfunction). The E/e&' ratio is   between 8-15, suggesting indeterminate LV filling pressure. - Mitral valve: Calcified annulus. Mildly thickened leaflets .   There was trivial regurgitation. - Left atrium: Moderately dilated. - Right atrium: The atrium was at the upper limits of normal in   size. 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 - Inferior vena cava: The vessel was normal in size. The   respirophasic diameter changes were in the normal range (>= 50%),   consistent with normal central venous pressure.  Impressions:  - LVEF 60-65%, normal wall thickness, normal wall motion, grade 1   DD, indeterminate LV filling pressure, trivial MR, moderate LAE,   upper normal RA size, aneurysmal IAS- cannot exclude PFO, normal   IVC.   Neuro/Psych negative neurological ROS  negative psych ROS   GI/Hepatic negative GI ROS, Neg liver ROS,   Endo/Other  negative endocrine ROS  Renal/GU negative Renal ROS  negative genitourinary   Musculoskeletal negative musculoskeletal ROS (+)   Abdominal    Peds negative pediatric ROS (+)  Hematology  (+) anemia ,   Anesthesia Other Findings   Reproductive/Obstetrics negative OB ROS                             Anesthesia Physical Anesthesia Plan  ASA: II  Anesthesia Plan: Spinal   Post-op Pain Management:    Induction: Intravenous  PONV Risk Score and Plan: 2 and Treatment may vary due to age or medical condition, Ondansetron and Dexamethasone  Airway Management Planned: Simple Face Mask  Additional Equipment:   Intra-op Plan:   Post-operative Plan:   Informed Consent: I have reviewed the patients History and Physical, chart, labs and discussed the procedure including the risks, benefits and alternatives for the proposed anesthesia with the patient or authorized representative who has indicated his/her understanding and acceptance.   Dental advisory given  Plan Discussed with: CRNA and Surgeon  Anesthesia Plan Comments:         Anesthesia Quick Evaluation

## 2017-04-23 NOTE — Anesthesia Postprocedure Evaluation (Signed)
Anesthesia Post Note  Patient: Ashley Ritter  Procedure(s) Performed: RIGHT TOTAL HIP ARTHROPLASTY ANTERIOR APPROACH (Right Hip)     Patient location during evaluation: PACU Anesthesia Type: Spinal Level of consciousness: oriented and awake and alert Pain management: pain level controlled Vital Signs Assessment: post-procedure vital signs reviewed and stable Respiratory status: spontaneous breathing, respiratory function stable and patient connected to nasal cannula oxygen Cardiovascular status: blood pressure returned to baseline and stable Postop Assessment: no headache, no backache and no apparent nausea or vomiting Anesthetic complications: no    Last Vitals:  Vitals:   04/23/17 1601 04/23/17 1700  BP: 120/64 108/61  Pulse: (!) 45 (!) 48  Resp: 12 12  Temp: (!) 36.3 C   SpO2: 100% 100%    Last Pain:  Vitals:   04/23/17 1811  TempSrc:   PainSc: 5                  [name]

## 2017-04-23 NOTE — Anesthesia Procedure Notes (Signed)
Spinal  Start time: 04/23/2017 11:26 AM End time: 04/23/2017 11:31 AM Staffing Resident/CRNA: Kizzie Fantasia, CRNA Performed: resident/CRNA  Preanesthetic Checklist Completed: patient identified, site marked, surgical consent, pre-op evaluation, timeout performed, IV checked, risks and benefits discussed and monitors and equipment checked Spinal Block Patient position: sitting Prep: Betadine Patient monitoring: heart rate, continuous pulse ox and blood pressure Approach: midline Location: L3-4 Injection technique: single-shot Needle Needle type: Whitacre  Needle gauge: 22 G Needle length: 9 cm Needle insertion depth: 8 cm Additional Notes Pt sitting position, sterile prep and drape, negative paresthesia, negative heme.

## 2017-04-23 NOTE — Discharge Instructions (Addendum)
° °Dr. Frank Aluisio °Total Joint Specialist °[facility] °[address] °[place], [place] [address] °[phone] ° °ANTERIOR APPROACH TOTAL HIP REPLACEMENT POSTOPERATIVE DIRECTIONS ° ° °Hip Rehabilitation, Guidelines Following Surgery  °The results of a hip operation are greatly improved after range of motion and muscle strengthening exercises. Follow all safety measures which are given to protect your hip. If any of these exercises cause increased pain or swelling in your joint, decrease the amount until you are comfortable again. Then slowly increase the exercises. Call your caregiver if you have problems or questions.  ° °HOME CARE INSTRUCTIONS  °Remove items at home which could result in a fall. This includes throw rugs or furniture in walking pathways.  °· ICE to the affected hip every three hours for 30 minutes at a time and then as needed for pain and swelling.  Continue to use ice on the hip for pain and swelling from surgery. You may notice swelling that will progress down to the foot and ankle.  This is normal after surgery.  Elevate the leg when you are not up walking on it.   °· Continue to use the breathing machine which will help keep your temperature down.  It is common for your temperature to cycle up and down following surgery, especially at night when you are not up moving around and exerting yourself.  The breathing machine keeps your lungs expanded and your temperature down. ° ° °DIET °You may resume your previous home diet once your are discharged from the hospital. ° °DRESSING / WOUND CARE / SHOWERING °You may shower 3 days after surgery, but keep the wounds dry during showering.  You may use an occlusive plastic wrap (Press'n Seal for example), NO SOAKING/SUBMERGING IN THE BATHTUB.  If the bandage gets wet, change with a clean dry gauze.  If the incision gets wet, pat the wound dry with a clean towel. °You may start showering once you are discharged home but do not  submerge the incision under water. Just pat the incision dry and apply a dry gauze dressing on daily. °Change the surgical dressing daily and reapply a dry dressing each time. ° °ACTIVITY °Walk with your walker as instructed. °Use walker as long as suggested by your caregivers. °Avoid periods of inactivity such as sitting longer than an hour when not asleep. This helps prevent blood clots.  °You may resume a sexual relationship in one month or when given the OK by your doctor.  °You may return to work once you are cleared by your doctor.  °Do not drive a car for 6 weeks or until released by you surgeon.  °Do not drive while taking narcotics. ° °WEIGHT BEARING °Weight bearing as tolerated with assist device (walker, cane, etc) as directed, use it as long as suggested by your surgeon or therapist, typically at least 4-6 weeks. ° °POSTOPERATIVE CONSTIPATION PROTOCOL °Constipation - defined medically as fewer than three stools per week and severe constipation as less than one stool per week. ° °One of the most common issues patients have following surgery is constipation.  Even if you have a regular bowel pattern at home, your normal regimen is likely to be disrupted due to multiple reasons following surgery.  Combination of anesthesia, postoperative narcotics, change in appetite and fluid intake all can affect your bowels.  In order to avoid complications following surgery, here are some recommendations in order to help you during your recovery period. ° °Colace (docusate) - Pick up an over-the-counter   form of Colace or another stool softener and take twice a day as long as you are requiring postoperative pain medications.  Take with a full glass of water daily.  If you experience loose stools or diarrhea, hold the colace until you stool forms back up.  If your symptoms do not get better within 1 week or if they get worse, check with your doctor. ° °Dulcolax (bisacodyl) - Pick up over-the-counter and take as directed  by the product packaging as needed to assist with the movement of your bowels.  Take with a full glass of water.  Use this product as needed if not relieved by Colace only.  ° °MiraLax (polyethylene glycol) - Pick up over-the-counter to have on hand.  MiraLax is a solution that will increase the amount of water in your bowels to assist with bowel movements.  Take as directed and can mix with a glass of water, juice, soda, coffee, or tea.  Take if you go more than two days without a movement. °Do not use MiraLax more than once per day. Call your doctor if you are still constipated or irregular after using this medication for 7 days in a row. ° °If you continue to have problems with postoperative constipation, please contact the office for further assistance and recommendations.  If you experience "the worst abdominal pain ever" or develop nausea or vomiting, please contact the office immediatly for further recommendations for treatment. ° °ITCHING ° If you experience itching with your medications, try taking only a single pain pill, or even half a pain pill at a time.  You can also use Benadryl over the counter for itching or also to help with sleep.  ° °TED HOSE STOCKINGS °Wear the elastic stockings on both legs for three weeks following surgery during the day but you may remove then at night for sleeping. ° °MEDICATIONS °See your medication summary on the “After Visit Summary” that the nursing staff will review with you prior to discharge.  You may have some home medications which will be placed on hold until you complete the course of blood thinner medication.  It is important for you to complete the blood thinner medication as prescribed by your surgeon.  Continue your approved medications as instructed at time of discharge. ° °PRECAUTIONS °If you experience chest pain or shortness of breath - call 911 immediately for transfer to the hospital emergency department.  °If you develop a fever greater that 101 F,  purulent drainage from wound, increased redness or drainage from wound, foul odor from the wound/dressing, or calf pain - CONTACT YOUR SURGEON.   °                                                °FOLLOW-UP APPOINTMENTS °Make sure you keep all of your appointments after your operation with your surgeon and caregivers. You should call the office at the above phone number and make an appointment for approximately two weeks after the date of your surgery or on the date instructed by your surgeon outlined in the "After Visit Summary". ° °RANGE OF MOTION AND STRENGTHENING EXERCISES  °These exercises are designed to help you keep full movement of your hip joint. Follow your caregiver's or physical therapist's instructions. Perform all exercises about fifteen times, three times per day or as directed. Exercise both hips, even if you   have had only one joint replacement. These exercises can be done on a training (exercise) mat, on the floor, on a table or on a bed. Use whatever works the best and is most comfortable for you. Use music or television while you are exercising so that the exercises are a pleasant break in your day. This will make your life better with the exercises acting as a break in routine you can look forward to.  °Lying on your back, slowly slide your foot toward your buttocks, raising your knee up off the floor. Then slowly slide your foot back down until your leg is straight again.  °Lying on your back spread your legs as far apart as you can without causing discomfort.  °Lying on your side, raise your upper leg and foot straight up from the floor as far as is comfortable. Slowly lower the leg and repeat.  °Lying on your back, tighten up the muscle in the front of your thigh (quadriceps muscles). You can do this by keeping your leg straight and trying to raise your heel off the floor. This helps strengthen the largest muscle supporting your knee.  °Lying on your back, tighten up the muscles of your  buttocks both with the legs straight and with the knee bent at a comfortable angle while keeping your heel on the floor.  ° °IF YOU ARE TRANSFERRED TO A SKILLED REHAB FACILITY °If the patient is transferred to a skilled rehab facility following release from the hospital, a list of the current medications will be sent to the facility for the patient to continue.  When discharged from the skilled rehab facility, please have the facility set up the patient's Home Health Physical Therapy prior to being released. Also, the skilled facility will be responsible for providing the patient with their medications at time of release from the facility to include their pain medication, the muscle relaxants, and their blood thinner medication. If the patient is still at the rehab facility at time of the two week follow up appointment, the skilled rehab facility will also need to assist the patient in arranging follow up appointment in our office and any transportation needs. ° °MAKE SURE YOU:  °Understand these instructions.  °Get help right away if you are not doing well or get worse.  ° ° °Pick up stool softner and laxative for home use following surgery while on pain medications. °Do not submerge incision under water. °Please use good hand washing techniques while changing dressing each day. °May shower starting three days after surgery. °Please use a clean towel to pat the incision dry following showers. °Continue to use ice for pain and swelling after surgery. °Do not use any lotions or creams on the incision until instructed by your surgeon. ° °Take Xarelto for two and a half more weeks following discharge from the hospital, then discontinue Xarelto. °Once the patient has completed the Xarelto, they may resume the 81 mg Aspirin. ° ° ° ° ° °Information on my medicine - XARELTO® (Rivaroxaban) ° °Why was Xarelto® prescribed for you? °Xarelto® was prescribed for you to reduce the risk of blood clots forming after orthopedic  surgery. The medical term for these abnormal blood clots is venous thromboembolism (VTE). ° °What do you need to know about xarelto® ? °Take your Xarelto® ONCE DAILY at the same time every day. °You may take it either with or without food. ° °If you have difficulty swallowing the tablet whole, you may crush it and mix in applesauce   just prior to taking your dose. ° °Take Xarelto® exactly as prescribed by your doctor and DO NOT stop taking Xarelto® without talking to the doctor who prescribed the medication.  Stopping without other VTE prevention medication to take the place of Xarelto® may increase your risk of developing a clot. ° °After discharge, you should have regular check-up appointments with your healthcare provider that is prescribing your Xarelto®.   ° °What do you do if you miss a dose? °If you miss a dose, take it as soon as you remember on the same day then continue your regularly scheduled once daily regimen the next day. Do not take two doses of Xarelto® on the same day.  ° °Important Safety Information °A possible side effect of Xarelto® is bleeding. You should call your healthcare provider right away if you experience any of the following: °? Bleeding from an injury or your nose that does not stop. °? Unusual colored urine (red or dark brown) or unusual colored stools (red or black). °? Unusual bruising for unknown reasons. °? A serious fall or if you hit your head (even if there is no bleeding). ° °Some medicines may interact with Xarelto® and might increase your risk of bleeding while on Xarelto®. To help avoid this, consult your healthcare provider or pharmacist prior to using any new prescription or non-prescription medications, including herbals, vitamins, non-steroidal anti-inflammatory drugs (NSAIDs) and supplements. ° °This website has more information on Xarelto®: www.xarelto.com. ° ° ° °

## 2017-04-23 NOTE — Op Note (Signed)
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   POSTOPERATIVE DIAGNOSIS: Osteoarthritis of the Right  hip.   PROCEDURE: Right total hip arthroplasty, anterior approach.   SURGEON: Ollen Gross, MD   ASSISTANT: Avel Peace, PA-C  ANESTHESIA:  Spinal  ESTIMATED BLOOD LOSS:-250 mL    DRAINS: Hemovac x1.   COMPLICATIONS: None   CONDITION: PACU - hemodynamically stable.   BRIEF CLINICAL NOTE: Ashley Ritter is a 76 y.o. female who has advanced end-  stage arthritis of their Right  hip with progressively worsening pain and  dysfunction.The patient has failed nonoperative management and presents for  total hip arthroplasty.   PROCEDURE IN DETAIL: After successful administration of spinal  anesthetic, the traction boots for the [facility] bed were placed on both  feet and the patient was placed onto the [facility] bed, boots placed into the leg  holders. The Right hip was then isolated from the perineum with plastic  drapes and prepped and draped in the usual sterile fashion. ASIS and  greater trochanter were marked and a oblique incision was made, starting  at about 1 cm lateral and 2 cm distal to the ASIS and coursing towards  the anterior cortex of the femur. The skin was cut with a 10 blade  through subcutaneous tissue to the level of the fascia overlying the  tensor fascia lata muscle. The fascia was then incised in line with the  incision at the junction of the anterior third and posterior 2/3rd. The  muscle was teased off the fascia and then the interval between the TFL  and the rectus was developed. The Hohmann retractor was then placed at  the top of the femoral neck over the capsule. The vessels overlying the  capsule were cauterized and the fat on top of the capsule was removed.  A Hohmann retractor was then placed anterior underneath the rectus  femoris to give exposure to the entire anterior capsule. A T-shaped   capsulotomy was performed. The edges were tagged and the femoral head  was identified.       Osteophytes are removed off the superior acetabulum.  The femoral neck was then cut in situ with an oscillating saw. Traction  was then applied to the left lower extremity utilizing the [facility]  traction. The femoral head was then removed. Retractors were placed  around the acetabulum and then circumferential removal of the labrum was  performed. Osteophytes were also removed. Reaming starts at 43 mm to  medialize and  Increased in 2 mm increments to 47 mm. We reamed in  approximately 40 degrees of abduction, 20 degrees anteversion. A 48 mm  pinnacle acetabular shell was then impacted in anatomic position under  fluoroscopic guidance with excellent purchase. We did not need to place  any additional dome screws. A 28 mm neutral + 4 marathon liner was then  placed into the acetabular shell.       The femoral lift was then placed along the lateral aspect of the femur  just distal to the vastus ridge. The leg was  externally rotated and capsule  was stripped off the inferior aspect of the femoral neck down to the  level of the lesser trochanter, this was done with electrocautery. The femur was lifted after this was performed. The  leg was then placed in an extended and adducted position essentially delivering the femur. We also removed the capsule superiorly and the piriformis from the piriformis  fossa to gain excellent exposure of the  proximal femur. Rongeur was used to remove some cancellous bone to get  into the lateral portion of the proximal femur for placement of the  initial starter reamer. The starter broaches was placed  the starter broach  and was shown to go down the center of the canal. Broaching  with the  Corail system was then performed starting at size 8, coursing  Up to size 10. A size 10 had excellent torsional and rotational  and axial stability. The trial high offset neck was then  placed  with a 28 + 1.5 trial head. The hip was then reduced. We confirmed that  the stem was in the canal both on AP and lateral x-rays. It also has excellent sizing. The hip was reduced with outstanding stability through full extension and full external rotation.. AP pelvis was taken and the leg lengths were measured and found to be equal. Hip was then dislocated again and the femoral head and neck removed. The  femoral broach was removed. Size 10 Corail stem with a high offset  neck was then impacted into the femur following native anteversion. Has  excellent purchase in the canal. Excellent torsional and rotational and  axial stability. It is confirmed to be in the canal on AP and lateral  fluoroscopic views. The 28 + 1.5 ceramic head was placed and the hip  reduced with outstanding stability. Again AP pelvis was taken and it  confirmed that the leg lengths were equal. The wound was then copiously  irrigated with saline solution and the capsule reattached and repaired  with Ethibond suture. 30 ml of .25% Bupivicaine was  injected into the capsule and into the edge of the tensor fascia lata as well as subcutaneous tissue. The fascia overlying the tensor fascia lata was then closed with a running #1 V-Loc. Subcu was closed with interrupted 2-0 Vicryl and subcuticular running 4-0 Monocryl. Incision was cleaned  and dried. Steri-Strips and a bulky sterile dressing applied. Hemovac  drain was hooked to suction and then the patient was awakened and transported to  recovery in stable condition.        Please note that a surgical assistant was a medical necessity for this procedure to perform it in a safe and expeditious manner. Assistant was necessary to provide appropriate retraction of vital neurovascular structures and to prevent femoral fracture and allow for anatomic placement of the prosthesis.  Gaynelle Arabian, M.D.

## 2017-04-24 LAB — CBC
HEMATOCRIT: 28.9 % — AB (ref 36.0–46.0)
Hemoglobin: 8.9 g/dL — ABNORMAL LOW (ref 12.0–15.0)
MCH: 27.5 pg (ref 26.0–34.0)
MCHC: 30.8 g/dL (ref 30.0–36.0)
MCV: 89.2 fL (ref 78.0–100.0)
Platelets: 293 10*3/uL (ref 150–400)
RBC: 3.24 MIL/uL — AB (ref 3.87–5.11)
RDW: 14.9 % (ref 11.5–15.5)
WBC: 11.8 10*3/uL — AB (ref 4.0–10.5)

## 2017-04-24 LAB — BASIC METABOLIC PANEL
ANION GAP: 5 (ref 5–15)
BUN: 22 mg/dL — ABNORMAL HIGH (ref 6–20)
CO2: 27 mmol/L (ref 22–32)
Calcium: 8.9 mg/dL (ref 8.9–10.3)
Chloride: 104 mmol/L (ref 101–111)
Creatinine, Ser: 1.16 mg/dL — ABNORMAL HIGH (ref 0.44–1.00)
GFR, EST AFRICAN AMERICAN: 52 mL/min — AB (ref >60)
GFR, EST NON AFRICAN AMERICAN: 45 mL/min — AB (ref >60)
GLUCOSE: 126 mg/dL — AB (ref 65–99)
POTASSIUM: 4.9 mmol/L (ref 3.5–5.1)
Sodium: 136 mmol/L (ref 135–145)

## 2017-04-24 MED ORDER — MUPIROCIN 2 % EX OINT
1.0000 | TOPICAL_OINTMENT | Freq: Two times a day (BID) | CUTANEOUS | Status: DC
Start: 2017-04-24 — End: 2017-04-26
  Administered 2017-04-24 – 2017-04-26 (×5): 1 via NASAL
  Filled 2017-04-24: qty 22

## 2017-04-24 MED ORDER — TRAMADOL HCL 50 MG PO TABS
50.0000 mg | ORAL_TABLET | Freq: Four times a day (QID) | ORAL | Status: DC | PRN
Start: 2017-04-24 — End: 2017-04-26
  Administered 2017-04-26 (×2): 50 mg via ORAL
  Filled 2017-04-24 (×2): qty 1

## 2017-04-24 MED ORDER — CHLORHEXIDINE GLUCONATE CLOTH 2 % EX PADS
6.0000 | MEDICATED_PAD | Freq: Every day | CUTANEOUS | Status: DC
  Administered 2017-04-24 – 2017-04-25 (×2): 6 via TOPICAL

## 2017-04-24 NOTE — Evaluation (Signed)
Physical Therapy Evaluation Patient Details Name: Ashley Ritter MRN: [id number] DOB: 02-Aug-1940 Today's Date: 04/24/2017   History of Present Illness  Pt s/p R THR and with hx of vertigo  Clinical Impression  Pt s/p R THR and presents with decreased R LE strength/ROM and post op pain limiting functional mobility.  Pt would benefit from follow up rehab at SNF level to maximize IND and safety prior to return home with limited assist.    Follow Up Recommendations SNF    Equipment Recommendations  None recommended by PT    Recommendations for Other Services OT consult     Precautions / Restrictions Precautions Precautions: Fall Restrictions Weight Bearing Restrictions: No Other Position/Activity Restrictions: WBAT      Mobility  Bed Mobility Overal bed mobility: Needs Assistance Bed Mobility: Supine to Sit     Supine to sit: Min assist;Mod assist;+2 for physical assistance;+2 for safety/equipment     General bed mobility comments: Increased time with cues for sequence and use of L LE to self assist.  Transfers Overall transfer level: Needs assistance Equipment used: Rolling walker (2 wheeled) Transfers: Sit to/from Stand Sit to Stand: Min assist;Mod assist;From elevated surface         General transfer comment: cues for LE management and use of UEs to self assist  Ambulation/Gait Ambulation/Gait assistance: Min assist;Mod assist;+2 safety/equipment;+2 physical assistance Ambulation Distance (Feet): 16 Feet Assistive device: Rolling walker (2 wheeled) Gait Pattern/deviations: Step-to pattern;Decreased step length - left;Decreased step length - right;Shuffle;Trunk flexed Gait velocity: decr Gait velocity interpretation: Below normal speed for age/gender General Gait Details: cues for sequence, posture and position from RW.  Increased time with physical assist for balance/support and to advance R LE  Stairs            Wheelchair Mobility    Modified  Rankin (Stroke Patients Only)       Balance Overall balance assessment: Needs assistance Sitting-balance support: No upper extremity supported;Feet supported Sitting balance-Leahy Scale: Fair     Standing balance support: Bilateral upper extremity supported Standing balance-Leahy Scale: Poor                               Pertinent Vitals/Pain Pain Assessment: 0-10 Pain Score: 6  Pain Location: R hip Pain Descriptors / Indicators: Aching;Sore Pain Intervention(s): Limited activity within patient's tolerance;Monitored during session;Premedicated before session;Ice applied    Home Living Family/patient expects to be discharged to:: Skilled nursing facility Living Arrangements: Alone               Additional Comments: Pt states she spoke to Dr Despina Hick and the plan is to dc to SNF rehab    Prior Function Level of Independence: Independent with assistive device(s)         Comments: Pt using cane vs rollator     Hand Dominance        Extremity/Trunk Assessment   Upper Extremity Assessment Upper Extremity Assessment: Overall WFL for tasks assessed    Lower Extremity Assessment Lower Extremity Assessment: RLE deficits/detail RLE Deficits / Details: Strength at hip 2/5 with AAROM at hip to 75 flex and 10 abd    Cervical / Trunk Assessment Cervical / Trunk Assessment: Normal  Communication   Communication: No difficulties  Cognition Arousal/Alertness: Awake/alert Behavior During Therapy: WFL for tasks assessed/performed Overall Cognitive Status: Within Functional Limits for tasks assessed  General Comments      Exercises Total Joint Exercises Ankle Circles/Pumps: AROM;Both;15 reps;Supine Quad Sets: AROM;Both;10 reps;Supine Heel Slides: AAROM;20 reps;Right;Supine Hip ABduction/ADduction: AAROM;Right;15 reps;Supine   Assessment/Plan    PT Assessment Patient needs continued PT services   PT Problem List Decreased strength;Decreased range of motion;Decreased activity tolerance;Decreased balance;Decreased mobility;Decreased knowledge of use of DME;Pain       PT Treatment Interventions DME instruction;Gait training;Stair training;Functional mobility training;Therapeutic activities;Therapeutic exercise;Patient/family education    PT Goals (Current goals can be found in the Care Plan section)  Acute Rehab PT Goals Patient Stated Goal: Rehab and then home alone PT Goal Formulation: With patient Time For Goal Achievement: 04/28/17 Potential to Achieve Goals: Good    Frequency 7X/week   Barriers to discharge Decreased caregiver support Pt reports home alone with no assist    Co-evaluation               AM-PAC PT "6 Clicks" Daily Activity  Outcome Measure Difficulty turning over in bed (including adjusting bedclothes, sheets and blankets)?: Unable Difficulty moving from lying on back to sitting on the side of the bed? : Unable Difficulty sitting down on and standing up from a chair with arms (e.g., wheelchair, bedside commode, etc,.)?: Unable Help needed moving to and from a bed to chair (including a wheelchair)?: A Lot Help needed walking in hospital room?: A Lot Help needed climbing 3-5 steps with a railing? : Total 6 Click Score: 8    End of Session Equipment Utilized During Treatment: Gait belt Activity Tolerance: Patient tolerated treatment well;Patient limited by fatigue;Patient limited by pain Patient left: in chair;with call bell/phone within reach;with chair alarm set Nurse Communication: Mobility status PT Visit Diagnosis: Unsteadiness on feet (R26.81);Difficulty in walking, not elsewhere classified (R26.2)    Time: [id number] PT Time Calculation (min) (ACUTE ONLY): 40 min   Charges:   PT Evaluation $PT Eval Low Complexity: 1 Low PT Treatments $Gait Training: 8-22 mins $Therapeutic Exercise: 8-22 mins   PT G Codes:        Pg [phone]  [phone]   [facility] [place] 04/24/2017, 1:28 PM

## 2017-04-24 NOTE — Progress Notes (Signed)
   Subjective: 1 Day Post-Op Procedure(s) (LRB): RIGHT TOTAL HIP ARTHROPLASTY ANTERIOR APPROACH (Right) Patient reports pain as mild.   Patient seen in rounds by Dr. Lequita HaltAluisio. Patient is well, but has had some minor complaints of pain in the hjip, requiring pain medications We will start therapy today.  Plan is to go Home after hospital stay.  Objective: Vital signs in last 24 hours: Temp:  [97.4 F (36.3 C)-98.2 F (36.8 C)] 98 F (36.7 C) (11/15 0654) Pulse Rate:  [44-66] 66 (11/15 0654) Resp:  [10-17] 16 (11/15 0654) BP: (100-151)/(43-73) 151/62 (11/15 0654) SpO2:  [96 %-100 %] 100 % (11/15 0654) Weight:  [83.5 kg (184 lb)] 83.5 kg (184 lb) (11/14 1041)  Intake/Output from previous day:  Intake/Output Summary (Last 24 hours) at 04/24/2017 0913 Last data filed at 04/24/2017 0600 Gross per 24 hour  Intake 2730 ml  Output 450 ml  Net 2280 ml    Intake/Output this shift: No intake/output data recorded.  Labs: Recent Labs    04/24/17 0511  HGB 8.9*   Recent Labs    04/24/17 0511  WBC 11.8*  RBC 3.24*  HCT 28.9*  PLT 293   Recent Labs    04/24/17 0511  NA 136  K 4.9  CL 104  CO2 27  BUN 22*  CREATININE 1.16*  GLUCOSE 126*  CALCIUM 8.9   No results for input(s): LABPT, INR in the last 72 hours.  EXAM General - Patient is Alert and Appropriate Extremity - Neurovascular intact Sensation intact distally Intact pulses distally Dressing - dressing C/D/I Motor Function - intact, moving foot and toes well on exam.  Hemovac pulled without difficulty.  Past Medical History:  Diagnosis Date  . Atypical chest pain   . Carotid bruit   . Hyperlipidemia   . Hypertension   . Vertigo     Assessment/Plan: 1 Day Post-Op Procedure(s) (LRB): RIGHT TOTAL HIP ARTHROPLASTY ANTERIOR APPROACH (Right) Principal Problem:   OA (osteoarthritis) of hip  Estimated body mass index is 34.77 kg/m as calculated from the following:   Height as of this encounter: 5\' 1"   (1.549 m).   Weight as of this encounter: 83.5 kg (184 lb). Advance diet Up with therapy Plan for discharge tomorrow Discharge home with home health  DVT Prophylaxis - Xarelto Weight Bearing As Tolerated right Leg Hemovac Pulled Begin Therapy  Avel Peacerew Perkins, PA-C Orthopaedic Surgery 04/24/2017, 9:13 AM

## 2017-04-24 NOTE — Progress Notes (Signed)
CSW consult:SNF Placement  Per physician note plan is for Home at discharge.   CSW will follow if disposition plan changes.  Vivi Barrack, Theresia Majors, MSW Clinical Social Worker  [phone] 04/24/2017  1:29 PM

## 2017-04-24 NOTE — Progress Notes (Signed)
Physical Therapy Treatment Patient Details Name: Ashley Ritter MRN: [id number] DOB: 1940/11/17 Today's Date: 04/24/2017    History of Present Illness Pt s/p R THR and with hx of vertigo    PT Comments    The patient is progressing this visit with mobility . Plans SNF.   Follow Up Recommendations  SNF     Equipment Recommendations  None recommended by PT    Recommendations for Other Services OT consult     Precautions / Restrictions Precautions Precautions: Fall Restrictions Weight Bearing Restrictions: No Other Position/Activity Restrictions: WBAT    Mobility  Bed Mobility Overal bed mobility: Needs Assistance Bed Mobility: Sit to Supine     Supine to sit: Min assist;Mod assist;+2 for physical assistance;+2 for safety/equipment Sit to supine: Mod assist   General bed mobility comments: assist with bothe legs onto bed.   Transfers Overall transfer level: Needs assistance Equipment used: Rolling walker (2 wheeled) Transfers: Sit to/from Stand Sit to Stand: Min assist         General transfer comment: cues for LE management and use of UEs to self assist  Ambulation/Gait Ambulation/Gait assistance: Min assist Ambulation Distance (Feet): 30 Feet Assistive device: Rolling walker (2 wheeled) Gait Pattern/deviations: Step-to pattern;Decreased step length - left;Decreased step length - right;Shuffle;Trunk flexed Gait velocity: decr Gait velocity interpretation: Below normal speed for age/gender General Gait Details: cues for sequence, posture and position from RW.  Increased time . Improved balance and taking steps.    Stairs            Wheelchair Mobility    Modified Rankin (Stroke Patients Only)       Balance Overall balance assessment: Needs assistance Sitting-balance support: No upper extremity supported;Feet supported Sitting balance-Leahy Scale: Fair     Standing balance support: Bilateral upper extremity supported Standing  balance-Leahy Scale: Poor                              Cognition Arousal/Alertness: Awake/alert Behavior During Therapy: WFL for tasks assessed/performed Overall Cognitive Status: Within Functional Limits for tasks assessed                                        Exercises    General Comments        Pertinent Vitals/Pain Pain Assessment: 0-10 Pain Score: 4  Pain Location: R hip Pain Descriptors / Indicators: Aching;Sore Pain Intervention(s): Monitored during session;Repositioned;Premedicated before session;Ice applied    Home Living Family/patient expects to be discharged to:: Skilled nursing facility Living Arrangements: Alone             Additional Comments: Pt states she spoke to Dr Despina Hick and the plan is to dc to SNF rehab    Prior Function Level of Independence: Independent with assistive device(s)      Comments: Pt using cane vs rollator   PT Goals (current goals can now be found in the care plan section) Acute Rehab PT Goals Patient Stated Goal: Rehab and then home alone PT Goal Formulation: With patient Time For Goal Achievement: 04/28/17 Potential to Achieve Goals: Good Progress towards PT goals: Progressing toward goals    Frequency    7X/week      PT Plan Current plan remains appropriate    Co-evaluation              AM-PAC PT "6  Clicks" Daily Activity  Outcome Measure  Difficulty turning over in bed (including adjusting bedclothes, sheets and blankets)?: Unable Difficulty moving from lying on back to sitting on the side of the bed? : Unable Difficulty sitting down on and standing up from a chair with arms (e.g., wheelchair, bedside commode, etc,.)?: Unable Help needed moving to and from a bed to chair (including a wheelchair)?: Total Help needed walking in hospital room?: Total Help needed climbing 3-5 steps with a railing? : Total 6 Click Score: 6    End of Session Equipment Utilized During Treatment:  Gait belt Activity Tolerance: Patient tolerated treatment well;Patient limited by fatigue;Patient limited by pain Patient left: in bed;with call bell/phone within reach;with family/visitor present Nurse Communication: Mobility status PT Visit Diagnosis: Unsteadiness on feet (R26.81);Difficulty in walking, not elsewhere classified (R26.2)     Time: 4098-11911438-1518 PT Time Calculation (min) (ACUTE ONLY): 40 min  Charges:  $Gait Training: 23-37 mins                    G Codes:       }   Sharen HeckHill, Devesh Monforte Elizabeth Calliope Delangel PT [phone]  04/24/2017, 4:22 PM

## 2017-04-25 LAB — CBC
HEMATOCRIT: 27.3 % — AB (ref 36.0–46.0)
HEMOGLOBIN: 8.4 g/dL — AB (ref 12.0–15.0)
MCH: 27.4 pg (ref 26.0–34.0)
MCHC: 30.8 g/dL (ref 30.0–36.0)
MCV: 88.9 fL (ref 78.0–100.0)
Platelets: 282 10*3/uL (ref 150–400)
RBC: 3.07 MIL/uL — AB (ref 3.87–5.11)
RDW: 14.8 % (ref 11.5–15.5)
WBC: 14 10*3/uL — ABNORMAL HIGH (ref 4.0–10.5)

## 2017-04-25 LAB — BASIC METABOLIC PANEL
ANION GAP: 3 — AB (ref 5–15)
BUN: 20 mg/dL (ref 6–20)
CHLORIDE: 108 mmol/L (ref 101–111)
CO2: 29 mmol/L (ref 22–32)
Calcium: 8.6 mg/dL — ABNORMAL LOW (ref 8.9–10.3)
Creatinine, Ser: 0.86 mg/dL (ref 0.44–1.00)
GFR calc Af Amer: >60 mL/min (ref >60)
GFR calc non Af Amer: >60 mL/min (ref >60)
GLUCOSE: 121 mg/dL — AB (ref 65–99)
POTASSIUM: 4.4 mmol/L (ref 3.5–5.1)
Sodium: 140 mmol/L (ref 135–145)

## 2017-04-25 MED ORDER — TRAMADOL HCL 50 MG PO TABS: 50.0000 mg | ORAL_TABLET | Freq: Four times a day (QID) | ORAL | 0 refills | Status: DC | PRN

## 2017-04-25 MED ORDER — TIZANIDINE HCL 4 MG PO TABS: 4.0000 mg | ORAL_TABLET | Freq: Four times a day (QID) | ORAL | 0 refills | Status: DC | PRN

## 2017-04-25 MED ORDER — BISACODYL 10 MG RE SUPP: 10.0000 mg | Freq: Every day | RECTAL | 0 refills | Status: DC | PRN

## 2017-04-25 MED ORDER — DIPHENHYDRAMINE HCL 12.5 MG/5ML PO ELIX: 12.5000 mg | ORAL_SOLUTION | ORAL | 0 refills | Status: DC | PRN

## 2017-04-25 MED ORDER — DOCUSATE SODIUM 100 MG PO CAPS: 100.0000 mg | ORAL_CAPSULE | Freq: Two times a day (BID) | ORAL | 0 refills | Status: DC

## 2017-04-25 MED ORDER — POLYETHYLENE GLYCOL 3350 17 G PO PACK: 17.0000 g | PACK | Freq: Every day | ORAL | 0 refills | Status: DC | PRN

## 2017-04-25 MED ORDER — RIVAROXABAN 10 MG PO TABS: 10.0000 mg | ORAL_TABLET | Freq: Every day | ORAL | 0 refills | Status: DC

## 2017-04-25 MED ORDER — POLYSACCHARIDE IRON COMPLEX 150 MG PO CAPS
150.0000 mg | ORAL_CAPSULE | Freq: Two times a day (BID) | ORAL | Status: DC
  Administered 2017-04-25 – 2017-04-26 (×3): 150 mg via ORAL
  Filled 2017-04-25 (×3): qty 1

## 2017-04-25 MED ORDER — ACETAMINOPHEN 325 MG PO TABS: 650.0000 mg | ORAL_TABLET | ORAL | 0 refills | Status: DC | PRN

## 2017-04-25 MED ORDER — HYDROMORPHONE HCL 2 MG PO TABS: 2.0000 mg | ORAL_TABLET | ORAL | 0 refills | Status: DC | PRN

## 2017-04-25 MED ORDER — TIZANIDINE HCL 4 MG PO TABS
4.0000 mg | ORAL_TABLET | Freq: Four times a day (QID) | ORAL | Status: DC | PRN
  Administered 2017-04-25 – 2017-04-26 (×2): 4 mg via ORAL
  Filled 2017-04-25 (×2): qty 1

## 2017-04-25 NOTE — Progress Notes (Signed)
Physical Therapy Treatment Patient Details Name: Ashley Ritter MRN: [id number] DOB: 1941-06-06 Today's Date: 04/25/2017    History of Present Illness Pt s/p R THR and with hx of vertigo    PT Comments    Ambulated 10 feet with min assist, requiring VC's for sequencing with RW. Patient limited by pain in the R hip when walking that diminished with rest. Required mod assist with B LE's when getting back into bed. Positioned in bed with family member in room.    Follow Up Recommendations  SNF     Equipment Recommendations  None recommended by PT    Recommendations for Other Services OT consult     Precautions / Restrictions Precautions Precautions: Fall Restrictions Weight Bearing Restrictions: No Other Position/Activity Restrictions: WBAT    Mobility  Bed Mobility Overal bed mobility: Needs Assistance Bed Mobility: Sit to Supine       Sit to supine: Mod assist   General bed mobility comments: support for BLE's and use of overhead trapez bar.   Transfers Overall transfer level: Needs assistance Equipment used: Rolling walker (2 wheeled) Transfers: Sit to/from Stand Sit to Stand: Min assist Stand pivot transfers: Min assist       General transfer comment: 25% VC's for UE and LE placement.   Ambulation/Gait Ambulation/Gait assistance: Min assist Ambulation Distance (Feet): 10 Feet Assistive device: Rolling walker (2 wheeled) Gait Pattern/deviations: Step-to pattern;Decreased step length - left;Decreased step length - right;Shuffle;Trunk flexed Gait velocity: decreased Gait velocity interpretation: Below normal speed for age/gender General Gait Details: 25% VC's for sequencing and safety for completion of turns.    Stairs            Wheelchair Mobility    Modified Rankin (Stroke Patients Only)       Balance                                            Cognition Arousal/Alertness: Awake/alert Behavior During Therapy: WFL  for tasks assessed/performed Overall Cognitive Status: Within Functional Limits for tasks assessed                                        Exercises     General Comments        Pertinent Vitals/Pain Pain Assessment: 0-10 Pain Score: 9  Pain Location: R hip Pain Descriptors / Indicators: Aching;Sore Pain Intervention(s): Limited activity within patient's tolerance;Monitored during session;Repositioned;Premedicated before session;Ice applied    Home Living                      Prior Function            PT Goals (current goals can now be found in the care plan section)      Frequency    7X/week      PT Plan Current plan remains appropriate    Co-evaluation              AM-PAC PT "6 Clicks" Daily Activity  Outcome Measure  Difficulty turning over in bed (including adjusting bedclothes, sheets and blankets)?: Unable Difficulty moving from lying on back to sitting on the side of the bed? : Unable Difficulty sitting down on and standing up from a chair with arms (e.g., wheelchair, bedside commode, etc,.)?: Unable Help needed  moving to and from a bed to chair (including a wheelchair)?: A Lot Help needed walking in hospital room?: A Lot Help needed climbing 3-5 steps with a railing? : Total 6 Click Score: 8    End of Session Equipment Utilized During Treatment: Gait belt Activity Tolerance: Patient tolerated treatment well;Patient limited by fatigue;Patient limited by pain Patient left: with call bell/phone within reach;with family/visitor present;in bed Nurse Communication: Mobility status;Patient requests pain meds PT Visit Diagnosis: Unsteadiness on feet (R26.81);Difficulty in walking, not elsewhere classified (R26.2)     Time: [id number] PT Time Calculation (min) (ACUTE ONLY): 20 min  Charges:  $Gait Training: 8-22 mins $Therapeutic Activity: 8-22 mins                    G Codes:       Stephens NovemberNicholas Neighbors, SPTA [facility]  Acute Re[facility] [phone]`  Felecia ShellingLori Anupama Piehl  PTA [facility]  Acute  Rehab Pager      [phone]

## 2017-04-25 NOTE — Clinical Social Work Placement (Signed)
[facility] /Discharge: 04/26/2017 Room 708  Nurse call report to : [phone] ext. [phone]  CLINICAL SOCIAL WORK PLACEMENT  NOTE  Date:  04/25/2017  Patient Details  Name: Ashley Ritter MRN: [id number] Date of Birth: 1940/08/04  Clinical Social Work is seeking post-discharge placement for this patient at the Skilled  Nursing Facility level of care (*CSW will initial, date and re-position this form in  chart as items are completed):  Yes   Patient/family provided with  Clinical Social Work Department's list of facilities offering this level of care within the geographic area requested by the patient (or if unable, by the patient's family).  Yes   Patient/family informed of their freedom to choose among providers that offer the needed level of care, that participate in Medicare, Medicaid or managed care program needed by the patient, have an available bed and are willing to accept the patient.  Yes   Patient/family informed of 's ownership interest in [facility] and [facility], as well as of the fact that they are under no obligation to receive care at these facilities.  PASRR submitted to EDS on       PASRR number received on       Existing PASRR number confirmed on 04/25/17     FL2 transmitted to all facilities in geographic area requested by pt/family on       FL2 transmitted to all facilities within larger geographic area on 04/25/17     Patient informed that his/her managed care company has contracts with or will negotiate with certain facilities, including the following:  [facility] [place]     Yes   Patient/family informed of bed offers received.  [facility]     Physician recommends and patient chooses [facility] at [facility] [place]    Patient to be transferred to [facility] [place] on 04/26/17.  Patient to be transferred to facility by [facility]     Patient family notified on   of  transfer.  Name of family member notified:        PHYSICIAN Please sign FL2     Additional Comment:    _______________________________________________ Clearance CootsNicole A Tera Pellicane, LCSW 04/25/2017, 10:42 AM

## 2017-04-25 NOTE — Progress Notes (Signed)
Physical Therapy Treatment Patient Details Name: Ashley LabellaBeulah L Ritter MRN: [id number] DOB: [birth year] Today's Date: 04/25/2017    History of Present Illness Pt s/p R THR and with hx of vertigo    PT Comments    Required min assist for transfers and ambulation with RW. Patient required VC's for sequencing with RW to and from bathroom. Patient limited by pain during treatment, notified RN. Positioned in recliner with ice.   Follow Up Recommendations  SNF     Equipment Recommendations  None recommended by PT    Recommendations for Other Services OT consult     Precautions / Restrictions Precautions Precautions: Fall Restrictions Weight Bearing Restrictions: No Other Position/Activity Restrictions: WBAT    Mobility  Bed Mobility                  Transfers Overall transfer level: Needs assistance Equipment used: Rolling walker (2 wheeled) Transfers: Sit to/from Stand Sit to Stand: Min assist Stand pivot transfers: Min assist       General transfer comment: 25% VC's for UE and LE placement.   Ambulation/Gait Ambulation/Gait assistance: Min assist Ambulation Distance (Feet): 10 Feet Assistive device: Rolling walker (2 wheeled) Gait Pattern/deviations: Step-to pattern;Decreased step length - left;Decreased step length - right;Shuffle;Trunk flexed Gait velocity: decreased Gait velocity interpretation: Below normal speed for age/gender General Gait Details: 50% VC's for sequencing and safety for completion of turns.    Stairs            Wheelchair Mobility    Modified Rankin (Stroke Patients Only)       Balance                                            Cognition Arousal/Alertness: Awake/alert Behavior During Therapy: WFL for tasks assessed/performed Overall Cognitive Status: Within Functional Limits for tasks assessed                                        Exercises Total Joint Exercises Ankle  Circles/Pumps: AROM;Both;Supine;20 reps Quad Sets: AROM;Both;10 reps;Supine    General Comments        Pertinent Vitals/Pain Pain Assessment: 0-10 Pain Score: 9  Pain Location: R hip Pain Descriptors / Indicators: Aching;Sore Pain Intervention(s): Limited activity within patient's tolerance;Monitored during session;Ice applied;Repositioned;RN gave pain meds during session    Home Living                      Prior Function            PT Goals (current goals can now be found in the care plan section)      Frequency    7X/week      PT Plan Current plan remains appropriate    Co-evaluation              AM-PAC PT "6 Clicks" Daily Activity  Outcome Measure  Difficulty turning over in bed (including adjusting bedclothes, sheets and blankets)?: Unable Difficulty moving from lying on back to sitting on the side of the bed? : Unable Difficulty sitting down on and standing up from a chair with arms (e.g., wheelchair, bedside commode, etc,.)?: Unable Help needed moving to and from a bed to chair (including a wheelchair)?: A Lot Help needed walking in hospital room?: A Lot  Help needed climbing 3-5 steps with a railing? : Total 6 Click Score: 8    End of Session Equipment Utilized During Treatment: Gait belt Activity Tolerance: Patient tolerated treatment well;Patient limited by fatigue;Patient limited by pain Patient left: with call bell/phone within reach;with family/visitor present;in chair Nurse Communication: Mobility status;Patient requests pain meds PT Visit Diagnosis: Unsteadiness on feet (R26.81);Difficulty in walking, not elsewhere classified (R26.2)     Time: 1030-1100 PT Time Calculation (min) (ACUTE ONLY): 30 min  Charges:  $Gait Training: 8-22 mins $Therapeutic Activity: 8-22 mins                    G Codes:       Stephens November, SPTA [facility] Re[facility] [phone]  Felecia Shelling  PTA [facility]  Acute  Rehab Pager      [phone]

## 2017-04-25 NOTE — NC FL2 (Signed)
[place] MEDICAID FL2 LEVEL OF CARE SCREENING TOOL     IDENTIFICATION  Patient Name: Ashley Ritter Birthdate: [birth year] Sex: female Admission Date (Current Location): 04/23/2017  [facility] and [place] Number:  Producer, television/film/video and Address:  [facility],  [address] [place] [address]      Provider Number: [id number]  Attending Physician Name and Address:  Ollen Gross, MD  Relative Name and Phone Number:       Current Level of Care: Hospital Recommended Level of Care: Skilled Nursing Facility Prior Approval Number:    Date Approved/Denied:   PASRR Number: [id number]  Discharge Plan: SNF    Current Diagnoses: Patient Active Problem List   Diagnosis Date Noted  . OA (osteoarthritis) of hip 04/23/2017  . Peripheral vascular disease (HCC) 04/09/2017  . AKI (acute kidney injury) (HCC) 11/03/2016  . H/O vertigo 11/03/2016  . Hypotension 11/03/2016  . Syncope 11/03/2016  . Atypical chest pain 12/27/2014  . Dyslipidemia 12/27/2014  . Essential hypertension 12/27/2014    Orientation RESPIRATION BLADDER Height & Weight     Self, Time, Situation, Place  Normal Continent Weight: 184 lb (83.5 kg) Height:  5\' 1"  (154.9 cm)  BEHAVIORAL SYMPTOMS/MOOD NEUROLOGICAL BOWEL NUTRITION STATUS      Continent Diet  AMBULATORY STATUS COMMUNICATION OF NEEDS Skin   Extensive Assist Verbally (Incision-Right Hip)                       Personal Care Assistance Level of Assistance  Bathing, Feeding, Dressing Bathing Assistance: Limited assistance Feeding assistance: Independent Dressing Assistance: Limited assistance     Functional Limitations Info  Sight, Hearing, Speech Sight Info: Adequate Hearing Info: Adequate Speech Info: Adequate    SPECIAL CARE FACTORS FREQUENCY  PT (By licensed PT), OT (By licensed OT)     PT Frequency: 7x/week OT Frequency: 2x/week            Contractures Contractures Info: Not present    Additional  Factors Info  Code Status, Allergies Code Status Info: Fullcode Allergies Info:  Penicillins, Oxycodone           Current Medications (04/25/2017):  This is the current hospital active medication list Current Facility-Administered Medications  Medication Dose Route Frequency Provider Last Rate Last Dose  . 0.9 %  sodium chloride infusion   Intravenous Continuous Perkins, Alexzandrew L, PA-C   Stopped at 04/24/17 0949  . acetaminophen (TYLENOL) tablet 650 mg  650 mg Oral Q4H PRN Aluisio, Homero Fellers, MD       Or  . acetaminophen (TYLENOL) suppository 650 mg  650 mg Rectal Q4H PRN Aluisio, Homero Fellers, MD      . bisacodyl (DULCOLAX) suppository 10 mg  10 mg Rectal Daily PRN Ollen Gross, MD      . Chlorhexidine Gluconate Cloth 2 % PADS 6 each  6 each Topical Daily Ollen Gross, MD   6 each at 04/24/17 [phone]  . diphenhydrAMINE (BENADRYL) 12.5 MG/5ML elixir 12.5-25 mg  12.5-25 mg Oral Q4H PRN Aluisio, Homero Fellers, MD      . docusate sodium (COLACE) capsule 100 mg  100 mg Oral BID Ollen Gross, MD   100 mg at 04/25/17 0828  . HYDROmorphone (DILAUDID) injection 0.5 mg  0.5 mg Intravenous Q2H PRN Ollen Gross, MD   0.5 mg at 04/24/17 0223  . HYDROmorphone (DILAUDID) tablet 2 mg  2 mg Oral Q4H PRN Ollen Gross, MD   2 mg at 04/25/17 0059  . HYDROmorphone (DILAUDID) tablet  4 mg  4 mg Oral Q4H PRN Ollen GrossAluisio, Frank, MD   4 mg at 04/25/17 0641  . iron polysaccharides (NIFEREX) capsule 150 mg  150 mg Oral BID Perkins, Alexzandrew L, PA-C      . meclizine (ANTIVERT) tablet 12.5 mg  12.5 mg Oral TID PRN Ollen GrossAluisio, Frank, MD      . menthol-cetylpyridinium (CEPACOL) lozenge 3 mg  1 lozenge Oral PRN Ollen GrossAluisio, Frank, MD       Or  . phenol (CHLORASEPTIC) mouth spray 1 spray  1 spray Mouth/Throat PRN Aluisio, Homero FellersFrank, MD      . metoCLOPramide (REGLAN) tablet 5-10 mg  5-10 mg Oral Q8H PRN Aluisio, Homero FellersFrank, MD       Or  . metoCLOPramide (REGLAN) injection 5-10 mg  5-10 mg Intravenous Q8H PRN Aluisio, Homero FellersFrank, MD      . mupirocin  ointment (BACTROBAN) 2 % 1 application  1 application Nasal BID Ollen GrossAluisio, Frank, MD   1 application at 04/24/17 2144  . ondansetron (ZOFRAN) tablet 4 mg  4 mg Oral Q6H PRN Aluisio, Homero FellersFrank, MD       Or  . ondansetron [facility](ZOFRAN) injection 4 mg  4 mg Intravenous Q6H PRN Ollen GrossAluisio, Frank, MD   4 mg at 04/23/17 1837  . polyethylene glycol (MIRALAX / GLYCOLAX) packet 17 g  17 g Oral Daily PRN Aluisio, Homero FellersFrank, MD      . rivaroxaban Carlena Hurl(XARELTO) tablet 10 mg  10 mg Oral Q breakfast Ollen GrossAluisio, Frank, MD   10 mg at 04/25/17 0828  . sodium phosphate (FLEET) 7-19 GM/118ML enema 1 enema  1 enema Rectal Once PRN Aluisio, Homero FellersFrank, MD      . tiZANidine (ZANAFLEX) tablet 4 mg  4 mg Oral Q6H PRN Perkins, Alexzandrew L, PA-C      . traMADol (ULTRAM) tablet 50-100 mg  50-100 mg Oral Q6H PRN Absher, Ky Barbanandall K, RPH      . triamterene-hydrochlorothiazide (DYAZIDE) 37.5-25 MG per capsule 1 capsule  1 capsule Oral Daily Aluisio, Homero FellersFrank, MD       Facility-Administered Medications Ordered in Other Encounters  Medication Dose Route Frequency Provider Last Rate Last Dose  . vancomycin (VANCOCIN) 1,000 mg in sodium chloride 0.9 % 500 mL IVPB  1,000 mg Intravenous Once Perkins, Alexzandrew L, PA-C         Discharge Medications: Please see discharge summary for a list of discharge medications.  Relevant Imaging Results:  Relevant Lab Results:   Additional Information ssn:[id number]  Clearance CootsNicole A Kai Calico, LCSW

## 2017-04-25 NOTE — Progress Notes (Signed)
   Subjective: 2 Days Post-Op Procedure(s) (LRB): RIGHT TOTAL HIP ARTHROPLASTY ANTERIOR APPROACH (Right) Patient reports pain as mild.   Patient seen in rounds with Dr. Lequita Halt.  Doing better.  Discussed about going to skilled rehab.  Social worker consulted and plan for tomorrow most likely. Patient is well, and has had no acute complaints or problems Patient is ready to go to the SNF tomorrow after bed approved.  Objective: Vital signs in last 24 hours: Temp:  [97.7 F (36.5 C)-98.7 F (37.1 C)] 98.7 F (37.1 C) (11/16 0618) Pulse Rate:  [54-70] 54 (11/16 0618) Resp:  [16-18] 16 (11/16 0618) BP: (104-128)/(46-65) 126/48 (11/16 0618) SpO2:  [99 %-100 %] 100 % (11/16 0618)  Intake/Output from previous day:  Intake/Output Summary (Last 24 hours) at 04/25/2017 0829 Last data filed at 04/25/2017 0629 Gross per 24 hour  Intake 1560 ml  Output 2200 ml  Net -640 ml    Intake/Output this shift: No intake/output data recorded.  Labs: Recent Labs    04/24/17 0511 04/25/17 0511  HGB 8.9* 8.4*   Recent Labs    04/24/17 0511 04/25/17 0511  WBC 11.8* 14.0*  RBC 3.24* 3.07*  HCT 28.9* 27.3*  PLT 293 282   Recent Labs    04/24/17 0511 04/25/17 0511  NA 136 140  K 4.9 4.4  CL 104 108  CO2 27 29  BUN 22* 20  CREATININE 1.16* 0.86  GLUCOSE 126* 121*  CALCIUM 8.9 8.6*   No results for input(s): LABPT, INR in the last 72 hours.  EXAM: General - Patient is Alert, Appropriate and Oriented Extremity - Neurovascular intact Sensation intact distally Incision - clean, dry, no drainage Motor Function - intact, moving foot and toes well on exam.   Assessment/Plan: 2 Days Post-Op Procedure(s) (LRB): RIGHT TOTAL HIP ARTHROPLASTY ANTERIOR APPROACH (Right) Procedure(s) (LRB): RIGHT TOTAL HIP ARTHROPLASTY ANTERIOR APPROACH (Right) Past Medical History:  Diagnosis Date  . Atypical chest pain   . Carotid bruit   . Hyperlipidemia   . Hypertension   . Vertigo     Principal Problem:   OA (osteoarthritis) of hip  Estimated body mass index is 34.77 kg/m as calculated from the following:   Height as of this encounter: 5\' 1"  (1.549 m).   Weight as of this encounter: 83.5 kg (184 lb). Up with therapy Discharge to SNF tomorrow after seen by weekend coverage staff.   Diet - Cardiac diet Follow up - in 2 weeks Activity - WBAT Disposition - Skilled nursing facility Condition Upon Discharge - pending at this time D/C Meds - See DC Summary DVT Prophylaxis - Xarelto  Avel Peace, PA-C Orthopaedic Surgery 04/25/2017, 8:29 AM

## 2017-04-25 NOTE — Clinical Social Work Note (Signed)
Clinical Social Work Assessment  Patient Details  Name: Ashley Ritter MRN: [id number] Date of Birth: [birth year]  Date of referral:  04/25/17               Reason for consult:  Facility Placement                Permission sought to share information with:  Chartered certified accountant granted to share information::  Yes, Verbal Permission Granted  Name::        Agency::     Relationship::     Contact Information:     Housing/Transportation Living arrangements for the past 2 months:  Single Family Home Source of Information:  Patient Patient Interpreter Needed:  None Criminal Activity/Legal Involvement Pertinent to Current Situation/Hospitalization:  No - Comment as needed Significant Relationships:  Adult Children Lives with:  Self Do you feel safe going back to the place where you live?  Yes Need for family participation in patient care:  Yes (Comment)  Care giving concerns: Patient reports pain for the last two years and a fall in May 2018. Patient admitted for right hip anthroplasty (anterior.)  Social Worker assessment / plan:  CSW met with the patient at bedside, explain role and reason for visit. Patient she lives home alone and understands she will not be able care for herself alone at this time. She report since her spouse died in October 03, 2015 she has been managing alone with the support of her adult children.  The patient states prior to hip surgery she was independent and cannot wait to get back home before the holidays. Patient reports she had surgery earlier this year and went to [facility] and had a "great experience with the therapist. This time she prefers a facility close to her home in [place].  Plan: Assist with discharge to SNF.   Employment status:  Retired Forensic scientist:  Medicare PT Recommendations:  [place] / Referral to community resources:  [place]  Patient/Family's Response to care:  Agreeable and Responding well to care. Appreciative to [place] services.   Patient/Family's Understanding of and Emotional Response to Diagnosis, Current Treatment, and Prognosis:  Patient knowledgeable about her current treatment and goals to rehab. She is hopeful to progress quickly before the holidays.   Emotional Assessment Appearance:  Appears stated age Attitude/Demeanor/Rapport:    Affect (typically observed):  Accepting, Calm Orientation:  Oriented to Self, Oriented to Place, Oriented to  Time, Oriented to Situation Alcohol / Substance use:  Not Applicable Psych involvement (Current and /or in the community):  No (Comment)  Discharge Needs  Concerns to be addressed:  Discharge Planning Concerns Readmission within the last 30 days:  No Current discharge risk:  Dependent with Mobility Barriers to Discharge:  Continued Medical Work up   [employer], LCSW 04/25/2017, 9:55 AM

## 2017-04-25 NOTE — Evaluation (Signed)
Occupational Therapy Evaluation Patient Details Name: Ashley Ritter MRN: [id number] DOB: 03-21-1941 Today's Date: 04/25/2017    History of Present Illness Pt s/p R THR and with hx of vertigo   Clinical Impression   This 76 year old female was admitted for the above sx. She lives alone and needs up to max A for LB dressing. She needs min A and extra time for SPT. She will benefit from continued OT. Goals range from supervision to mod A in acute setting    Follow Up Recommendations  SNF    Equipment Recommendations  None recommended by OT    Recommendations for Other Services       Precautions / Restrictions Precautions Precautions: Fall      Mobility Bed Mobility         Supine to sit: Min assist     General bed mobility comments: use of rails, HOB raised, support for bil LEs and extra time  Transfers   Equipment used: Rolling walker (2 wheeled)   Sit to Stand: Min assist Stand pivot transfers: Min assist       General transfer comment: cues for UE/LE placement and sequencing. Pt tended to turn in a heel/toe motion, minimizing steps.  Cued to push down on arms and take pivotal steps    Balance             Standing balance-Leahy Scale: Poor(able to release one hand from RW with min guard)                             ADL either performed or assessed with clinical judgement   ADL Overall ADL's : Needs assistance/impaired Eating/Feeding: Independent   Grooming: Set up;Sitting   Upper Body Bathing: Set up;Sitting   Lower Body Bathing: Moderate assistance;Sit to/from stand   Upper Body Dressing : Set up;Sitting   Lower Body Dressing: Maximal assistance;Sit to/from stand   Toilet Transfer: Minimal assistance;Stand-pivot;BSC;RW   Toileting- Clothing Manipulation and Hygiene: Moderate assistance;Sit to/from stand         General ADL Comments: pt had urgency; used BSC. Pt with increased pain with movement. She needs extra time for  all mobility due to pain     Vision         Perception     Praxis      Pertinent Vitals/Pain Pain Score: 6 (with movement) Pain Location: R hip Pain Descriptors / Indicators: Aching;Sore Pain Intervention(s): Limited activity within patient's tolerance;Monitored during session;Premedicated before session;Repositioned;Ice applied     Hand Dominance     Extremity/Trunk Assessment Upper Extremity Assessment Upper Extremity Assessment: Overall WFL for tasks assessed           Communication Communication Communication: No difficulties   Cognition Arousal/Alertness: Awake/alert Behavior During Therapy: WFL for tasks assessed/performed Overall Cognitive Status: Within Functional Limits for tasks assessed                                     General Comments       Exercises     Shoulder Instructions      Home Living Family/patient expects to be discharged to:: Skilled nursing facility Living Arrangements: Alone                               Additional Comments: has all  DME      Prior Functioning/Environment Level of Independence: Independent with assistive device(s)        Comments: Pt using cane vs rollator        OT Problem List: Decreased strength;Decreased activity tolerance;Impaired balance (sitting and/or standing);Decreased knowledge of use of DME or AE;Pain      OT Treatment/Interventions: Self-care/ADL training;DME and/or AE instruction;Patient/family education;Balance training    OT Goals(Current goals can be found in the care plan section) Acute Rehab OT Goals Patient Stated Goal: Rehab and then home alone OT Goal Formulation: With patient Time For Goal Achievement: 05/02/17 Potential to Achieve Goals: Good ADL Goals Pt Will Perform Grooming: with supervision;standing Pt Will Perform Lower Body Bathing: with min assist;with adaptive equipment;sit to/from stand Pt Will Perform Lower Body Dressing: with mod  assist;sit to/from stand;with adaptive equipment Pt Will Transfer to Toilet: with min guard assist;ambulating;bedside commode Pt Will Perform Toileting - Clothing Manipulation and hygiene: with min guard assist;sit to/from stand  OT Frequency: Min 2X/week   Barriers to D/C:            Co-evaluation              AM-PAC PT "6 Clicks" Daily Activity     Outcome Measure Help from another person eating meals?: None Help from another person taking care of personal grooming?: A Little Help from another person toileting, which includes using toliet, bedpan, or urinal?: A Lot Help from another person bathing (including washing, rinsing, drying)?: A Lot Help from another person to put on and taking off regular upper body clothing?: A Little Help from another person to put on and taking off regular lower body clothing?: A Lot 6 Click Score: 16   End of Session    Activity Tolerance: Patient limited by pain Patient left: in chair;with call bell/phone within reach;with family/visitor present  OT Visit Diagnosis: Pain Pain - Right/Left: Right Pain - part of body: Hip                Time: [id number] OT Time Calculation (min): 31 min Charges:  OT General Charges $OT Visit: 1 Visit OT Evaluation $OT Eval Low Complexity: 1 Low OT Treatments $Self Care/Home Management : 8-22 mins G-Codes:     DonovanMaryellen Mercie Balsley, OTR/L [phone] 04/25/2017  Essance Gatti 04/25/2017, 8:38 AM

## 2017-04-25 NOTE — Discharge Summary (Signed)
Physician Discharge Summary   Patient ID: Ashley Ritter MRN: [id number] DOB/AGE: October 04, 1940 76 y.o.  Admit date: 04/23/2017 Discharge date: Tentative Date of Discharge - Saturday - 04/26/2017  Primary Diagnosis:  Osteoarthritis of the Right hip.    Admission Diagnoses:  Past Medical History:  Diagnosis Date  . Atypical chest pain   . Carotid bruit   . Hyperlipidemia   . Hypertension   . Vertigo    Discharge Diagnoses:   Principal Problem:   OA (osteoarthritis) of hip  Estimated body mass index is 34.77 kg/m as calculated from the following:   Height as of this encounter: _0  (1.549 m).   Weight as of this encounter: 83.5 kg (184 lb).  Procedure(s) (LRB): RIGHT TOTAL HIP ARTHROPLASTY ANTERIOR APPROACH (Right)   Consults: None  HPI: Ashley Ritter is a 76 y.o. female who has advanced end-  stage arthritis of their Right  hip with progressively worsening pain and  dysfunction.The patient has failed nonoperative management and presents for  total hip arthroplasty.    Laboratory Data: Admission on 04/23/2017  Component Date Value Ref Range Status  . ABO/RH(D) 04/23/2017 B POS   Final  . Antibody Screen 04/23/2017 NEG   Final  . Sample Expiration 04/23/2017 04/26/2017   Final  . ABO/RH(D) 04/23/2017 B POS   Final  . WBC 04/24/2017 11.8* 4.0 - 10.5 K/uL Final  . RBC 04/24/2017 3.24* 3.87 - 5.11 MIL/uL Final  . Hemoglobin 04/24/2017 8.9* 12.0 - 15.0 g/dL Final  . HCT 04/24/2017 28.9* 36.0 - 46.0 % Final  . MCV 04/24/2017 89.2  78.0 - 100.0 fL Final  . MCH 04/24/2017 27.5  26.0 - 34.0 pg Final  . MCHC 04/24/2017 30.8  30.0 - 36.0 g/dL Final  . RDW 04/24/2017 14.9  11.5 - 15.5 % Final  . Platelets 04/24/2017 293  150 - 400 K/uL Final  . Sodium 04/24/2017 136  135 - 145 mmol/L Final  . Potassium 04/24/2017 4.9  3.5 - 5.1 mmol/L Final  . Chloride 04/24/2017 104  101 - 111 mmol/L Final  . CO2 04/24/2017 27  22 - 32 mmol/L Final  . Glucose, Bld 04/24/2017 126* 65  - 99 mg/dL Final  . BUN 04/24/2017 22* 6 - 20 mg/dL Final  . Creatinine, Ser 04/24/2017 1.16* 0.44 - 1.00 mg/dL Final  . Calcium 04/24/2017 8.9  8.9 - 10.3 mg/dL Final  . GFR calc non Af Amer 04/24/2017 45* >60 mL/min Final  . GFR calc Af Amer 04/24/2017 52* >60 mL/min Final   Comment: (NOTE) The eGFR has been calculated using the CKD EPI equation. This calculation has not been validated in all clinical situations. eGFR's persistently <60 mL/min signify possible Chronic Kidney Disease.   . Anion gap 04/24/2017 5  5 - 15 Final  . WBC 04/25/2017 14.0* 4.0 - 10.5 K/uL Final  . RBC 04/25/2017 3.07* 3.87 - 5.11 MIL/uL Final  . Hemoglobin 04/25/2017 8.4* 12.0 - 15.0 g/dL Final  . HCT 04/25/2017 27.3* 36.0 - 46.0 % Final  . MCV 04/25/2017 88.9  78.0 - 100.0 fL Final  . MCH 04/25/2017 27.4  26.0 - 34.0 pg Final  . MCHC 04/25/2017 30.8  30.0 - 36.0 g/dL Final  . RDW 04/25/2017 14.8  11.5 - 15.5 % Final  . Platelets 04/25/2017 282  150 - 400 K/uL Final  . Sodium 04/25/2017 140  135 - 145 mmol/L Final  . Potassium 04/25/2017 4.4  3.5 - 5.1 mmol/L Final  . Chloride  04/25/2017 108  101 - 111 mmol/L Final  . CO2 04/25/2017 29  22 - 32 mmol/L Final  . Glucose, Bld 04/25/2017 121* 65 - 99 mg/dL Final  . BUN 04/25/2017 20  6 - 20 mg/dL Final  . Creatinine, Ser 04/25/2017 0.86  0.44 - 1.00 mg/dL Final  . Calcium 04/25/2017 8.6* 8.9 - 10.3 mg/dL Final  . GFR calc non Af Amer 04/25/2017 >60  >60 mL/min Final  . GFR calc Af Amer 04/25/2017 >60  >60 mL/min Final   Comment: (NOTE) The eGFR has been calculated using the CKD EPI equation. This calculation has not been validated in all clinical situations. eGFR's persistently <60 mL/min signify possible Chronic Kidney Disease.   . Anion gap 04/25/2017 3* 5 - 15 Final  Appointment on 04/18/2017  Component Date Value Ref Range Status  . Rest HR 04/18/2017 57  bpm Final  . Rest BP 04/18/2017 135/67  mmHg Final  . Peak HR 04/18/2017 99  bpm Final  .  Peak BP 04/18/2017 132/57  mmHg Final  . SSS 04/18/2017 4   Final  . SRS 04/18/2017 3   Final  . SDS 04/18/2017 1   Final  . LHR 04/18/2017 0.34   Final  . TID 04/18/2017 1.08   Final  . LV sys vol 04/18/2017 73  mL Final  . LV dias vol 04/18/2017 131  46 - 106 mL Final  Hospital Outpatient Visit on 04/17/2017  Component Date Value Ref Range Status  . aPTT 04/17/2017 34  24 - 36 seconds Final  . WBC 04/17/2017 8.1  4.0 - 10.5 K/uL Final  . RBC 04/17/2017 3.48* 3.87 - 5.11 MIL/uL Final  . Hemoglobin 04/17/2017 9.5* 12.0 - 15.0 g/dL Final  . HCT 04/17/2017 30.3* 36.0 - 46.0 % Final  . MCV 04/17/2017 87.1  78.0 - 100.0 fL Final  . MCH 04/17/2017 27.3  26.0 - 34.0 pg Final  . MCHC 04/17/2017 31.4  30.0 - 36.0 g/dL Final  . RDW 04/17/2017 15.2  11.5 - 15.5 % Final  . Platelets 04/17/2017 340  150 - 400 K/uL Final  . Sodium 04/17/2017 141  135 - 145 mmol/L Final  . Potassium 04/17/2017 4.2  3.5 - 5.1 mmol/L Final  . Chloride 04/17/2017 105  101 - 111 mmol/L Final  . CO2 04/17/2017 29  22 - 32 mmol/L Final  . Glucose, Bld 04/17/2017 99  65 - 99 mg/dL Final  . BUN 04/17/2017 15  6 - 20 mg/dL Final  . Creatinine, Ser 04/17/2017 0.98  0.44 - 1.00 mg/dL Final  . Calcium 04/17/2017 9.2  8.9 - 10.3 mg/dL Final  . Total Protein 04/17/2017 7.7  6.5 - 8.1 g/dL Final  . Albumin 04/17/2017 3.7  3.5 - 5.0 g/dL Final  . AST 04/17/2017 14* 15 - 41 U/L Final  . ALT 04/17/2017 9* 14 - 54 U/L Final  . Alkaline Phosphatase 04/17/2017 65  38 - 126 U/L Final  . Total Bilirubin 04/17/2017 0.7  0.3 - 1.2 mg/dL Final  . GFR calc non Af Amer 04/17/2017 55* >60 mL/min Final  . GFR calc Af Amer 04/17/2017 >60  >60 mL/min Final   Comment: (NOTE) The eGFR has been calculated using the CKD EPI equation. This calculation has not been validated in all clinical situations. eGFR's persistently <60 mL/min signify possible Chronic Kidney Disease.   . Anion gap 04/17/2017 7  5 - 15 Final  . Prothrombin Time  04/17/2017 12.7  11.4 - 15.2  seconds Final  . INR 04/17/2017 0.96   Final  . MRSA, PCR 04/17/2017 POSITIVE* NEGATIVE Final   Comment: RESULT CALLED TO, READ BACK BY AND VERIFIED WITH: STANLEY, K. _0  ON 11.9.18 BY NMCCOY   . Staphylococcus aureus 04/17/2017 POSITIVE* NEGATIVE Final   Comment: (NOTE) The Xpert SA Assay (FDA approved for NASAL specimens in patients 8 years of age and older), is one component of a comprehensive surveillance program. It is not intended to diagnose infection nor to guide or monitor treatment.      X-Rays:Dg Pelvis Portable  Result Date: 04/23/2017 CLINICAL DATA:  Postop right anterior hip replacement. EXAM: PORTABLE PELVIS 1-2 VIEWS COMPARISON:  Earlier today as well as CT 11/05/2016 FINDINGS: Exam demonstrates evidence of patient's recent right bipolar total hip arthroplasty with prosthetic components intact and normally positioned. Surgical drain over the soft tissues of the right hip. Moderate osteoarthritic change of the left hip. Mild diffuse osteopenia. IMPRESSION: Expected changes post right total hip arthroplasty. Moderate osteoarthritic change left hip. Electronically Signed   By: Marin Olp M.D.   On: 04/23/2017 14:02   Dg C-arm 1-60 Min-no Report  Result Date: 04/23/2017 Fluoroscopy was utilized by the requesting physician.  No radiographic interpretation.    EKG: Orders placed or performed during the hospital encounter of 11/08/16  . EKG 12-Lead  . EKG 12-Lead  . EKG 12-Lead  . EKG 12-Lead     Hospital Course: Patient was admitted to [facility] and taken to the OR and underwent the above state procedure without complications.  Patient tolerated the procedure well and was later transferred to the recovery room and then to the orthopaedic floor for postoperative care.  They were given PO and IV analgesics for pain control following their surgery.  They were given 24 hours of postoperative antibiotics of  Anti-infectives (From  admission, onward)   Start     Dose/Rate Route Frequency Ordered Stop   04/23/17 2330  vancomycin (VANCOCIN) IVPB 1000 mg/200 mL premix     1,000 mg 200 mL/hr over 60 Minutes Intravenous  Once 04/23/17 1308 04/24/17 0135   04/23/17 1006  vancomycin (VANCOCIN) 1-5 GM/200ML-% IVPB    Comments:  [place] Session   : cabinet override      04/23/17 1006 04/23/17 2214   04/23/17 1005  vancomycin (VANCOCIN) IVPB 1000 mg/200 mL premix     1,000 mg 200 mL/hr over 60 Minutes Intravenous On call to O.R. 04/23/17 1005 04/23/17 1215     and started on DVT prophylaxis in the form of Xarelto.   PT and OT were ordered for total hip protocol.  The patient was allowed to be WBAT with therapy. Discharge planning was consulted to help with postop disposition and equipment needs.  Patient had a good night on the evening of surgery.  They started to get up OOB with therapy on day one.  Hemovac drain was pulled without difficulty.   Continued to work with therapy into day two.  Dressing was changed on day two and the incision was healing well.  Patient was seen in rounds on day two and was doing well.  It was felt that as long as the patient continued to improve, they would be ready to transfer the following day, Saturday 04/26/2017 after evaluation by the weekend coverage staff.  The patient will evaluated by the weekend coverage staff and will discharge if doing well.  This summary was prepared in anticipation of the the patient's transfer over the weekend.  Diet: Cardiac diet Activity:WBAT Follow-up:in 2 weeks Disposition - Skilled nursing facility Discharged Condition: Pending at time of summary, Transfer on Saturday if doing well on weekend rounds.   Discharge Instructions    Call MD / Call 911   Complete by:  As directed    If you experience chest pain or shortness of breath, CALL 911 and be transported to the hospital emergency room.  If you develope a fever above 101 F, pus (white drainage) or increased  drainage or redness at the wound, or calf pain, call your surgeon's office.   Change dressing   Complete by:  As directed    You may change your dressing dressing daily with sterile 4 x 4 inch gauze dressing and paper tape.  Do not submerge the incision under water.   Constipation Prevention   Complete by:  As directed    Drink plenty of fluids.  Prune juice may be helpful.  You may use a stool softener, such as Colace (over the counter) 100 mg twice a day.  Use MiraLax (over the counter) for constipation as needed.   Diet - low sodium heart healthy   Complete by:  As directed    Discharge instructions   Complete by:  As directed    Take Xarelto for two and a half more weeks, then discontinue Xarelto. Once the patient has completed the Xarelto, they may resume the 81 mg Aspirin.   Pick up stool softner and laxative for home use following surgery while on pain medications. Do not submerge incision under water. Please use good hand washing techniques while changing dressing each day. May shower starting three days after surgery. Please use a clean towel to pat the incision dry following showers. Continue to use ice for pain and swelling after surgery. Do not use any lotions or creams on the incision until instructed by your surgeon.  Wear both TED hose on both legs during the day every day for three weeks, but may remove the TED hose at night at home.  Postoperative Constipation Protocol  Constipation - defined medically as fewer than three stools per week and severe constipation as less than one stool per week.  One of the most common issues patients have following surgery is constipation.  Even if you have a regular bowel pattern at home, your normal regimen is likely to be disrupted due to multiple reasons following surgery.  Combination of anesthesia, postoperative narcotics, change in appetite and fluid intake all can affect your bowels.  In order to avoid complications following  surgery, here are some recommendations in order to help you during your recovery period.  Colace (docusate) - Pick up an over-the-counter form of Colace or another stool softener and take twice a day as long as you are requiring postoperative pain medications.  Take with a full glass of water daily.  If you experience loose stools or diarrhea, hold the colace until you stool forms back up.  If your symptoms do not get better within 1 week or if they get worse, check with your doctor.  Dulcolax (bisacodyl) - Pick up over-the-counter and take as directed by the product packaging as needed to assist with the movement of your bowels.  Take with a full glass of water.  Use this product as needed if not relieved by Colace only.   MiraLax (polyethylene glycol) - Pick up over-the-counter to have on hand.  MiraLax is a solution that will increase the amount of water in your bowels  to assist with bowel movements.  Take as directed and can mix with a glass of water, juice, soda, coffee, or tea.  Take if you go more than two days without a movement. Do not use MiraLax more than once per day. Call your doctor if you are still constipated or irregular after using this medication for 7 days in a row.  If you continue to have problems with postoperative constipation, please contact the office for further assistance and recommendations.  If you experience "the worst abdominal pain ever" or develop nausea or vomiting, please contact the office immediatly for further recommendations for treatment.    Do not sit on low chairs, stoools or toilet seats, as it may be difficult to get up from low surfaces   Complete by:  As directed    Driving restrictions   Complete by:  As directed    No driving until released by the physician.   Increase activity slowly as tolerated   Complete by:  As directed    Lifting restrictions   Complete by:  As directed    No lifting until released by the physician.   Patient may shower    Complete by:  As directed    You may shower without a dressing once there is no drainage.  Do not wash over the wound.  If drainage remains, do not shower until drainage stops.   TED hose   Complete by:  As directed    Use stockings (TED hose) for 3 weeks on both leg(s).  You may remove them at night for sleeping.   Weight bearing as tolerated   Complete by:  As directed    Laterality:  right   Extremity:  Lower     Allergies as of 04/25/2017      Reactions   Penicillins Other (See Comments)   Tolerates ertapenem    Oxycodone Itching      Medication List    STOP taking these medications   ALEVE PM 220-25 MG Tabs Generic drug:  Naproxen Sod-Diphenhydramine   aspirin EC 81 MG tablet   diclofenac sodium 1 % Gel Commonly known as:  VOLTAREN   DOCOSAHEXAENOIC ACID PO   GARLIC PO   VITAMIN Y-50 PO   Vitamin-B Complex Tabs     TAKE these medications   acetaminophen 325 MG tablet Commonly known as:  TYLENOL Take 2 tablets (650 mg total) every 4 (four) hours as needed by mouth for mild pain ((score 1 to 3) or temp > 100.5).   bisacodyl 10 MG suppository Commonly known as:  DULCOLAX Place 1 suppository (10 mg total) daily as needed rectally for moderate constipation.   diphenhydrAMINE 12.5 MG/5ML elixir Commonly known as:  BENADRYL Take 5-10 mLs (12.5-25 mg total) every 4 (four) hours as needed by mouth for itching.   docusate sodium 100 MG capsule Commonly known as:  COLACE Take 1 capsule (100 mg total) 2 (two) times daily by mouth.   DYAZIDE 37.5-25 MG capsule Generic drug:  triamterene-hydrochlorothiazide Take 1 capsule by mouth daily.   HYDROmorphone 2 MG tablet Commonly known as:  DILAUDID Take 1-2 tablets (2-4 mg total) every 4 (four) hours as needed by mouth for moderate pain or severe pain.   meclizine 12.5 MG tablet Commonly known as:  ANTIVERT Take 12.5 mg by mouth 3 (three) times daily as needed for dizziness.   polyethylene glycol packet Commonly  known as:  MIRALAX / GLYCOLAX Take 17 g daily as needed by mouth for  mild constipation.   rivaroxaban 10 MG Tabs tablet Commonly known as:  XARELTO Take 1 tablet (10 mg total) daily with breakfast by mouth. Take Xarelto for two and a half more weeks following discharge from the hospital, then discontinue Xarelto. Once the patient has completed the Xarelto, they may resume the 81 mg Aspirin. Start taking on:  04/26/2017   TANDEM 162-115.2 MG Caps capsule Generic drug:  ferrous fumarate-iron polysaccharide complex Take 1 capsule by mouth daily.   tiZANidine 4 MG tablet Commonly known as:  ZANAFLEX Take 1 tablet (4 mg total) every 6 (six) hours as needed by mouth for muscle spasms.   traMADol 50 MG tablet Commonly known as:  ULTRAM Take 1-2 tablets (50-100 mg total) every 6 (six) hours as needed by mouth for moderate pain (if refractory to Dilaudid 2 mg).            Discharge Care Instructions  (From admission, onward)        Start     Ordered   04/25/17 0000  Weight bearing as tolerated    Question Answer Comment  Laterality right   Extremity Lower      04/25/17 0838   04/25/17 0000  Change dressing    Comments:  You may change your dressing dressing daily with sterile 4 x 4 inch gauze dressing and paper tape.  Do not submerge the incision under water.   04/25/17 9444     Follow-up Information    Gaynelle Arabian, MD. Schedule an appointment as soon as possible for a visit on 05/06/2017.   Specialty:  Orthopedic Surgery Contact information: [address] [place] [address] [phone]           Signed: Arlee Muslim, PA-C Orthopaedic Surgery 04/25/2017, 8:44 AM

## 2017-04-25 NOTE — Progress Notes (Signed)
Plan for d/c to SNF, discharge planning per CSW. [phone] 

## 2017-04-26 DIAGNOSIS — Z471 Aftercare following joint replacement surgery: Secondary | ICD-10-CM | POA: Diagnosis not present

## 2017-04-26 DIAGNOSIS — E785 Hyperlipidemia, unspecified: Secondary | ICD-10-CM | POA: Diagnosis not present

## 2017-04-26 DIAGNOSIS — R262 Difficulty in walking, not elsewhere classified: Secondary | ICD-10-CM | POA: Diagnosis not present

## 2017-04-26 DIAGNOSIS — R0989 Other specified symptoms and signs involving the circulatory and respiratory systems: Secondary | ICD-10-CM | POA: Diagnosis not present

## 2017-04-26 DIAGNOSIS — R0789 Other chest pain: Secondary | ICD-10-CM | POA: Diagnosis not present

## 2017-04-26 DIAGNOSIS — M1611 Unilateral primary osteoarthritis, right hip: Secondary | ICD-10-CM | POA: Diagnosis not present

## 2017-04-26 DIAGNOSIS — G8918 Other acute postprocedural pain: Secondary | ICD-10-CM | POA: Diagnosis not present

## 2017-04-26 DIAGNOSIS — Z96641 Presence of right artificial hip joint: Secondary | ICD-10-CM | POA: Diagnosis not present

## 2017-04-26 DIAGNOSIS — Z4789 Encounter for other orthopedic aftercare: Secondary | ICD-10-CM | POA: Diagnosis not present

## 2017-04-26 DIAGNOSIS — D649 Anemia, unspecified: Secondary | ICD-10-CM | POA: Diagnosis not present

## 2017-04-26 DIAGNOSIS — S79911A Unspecified injury of right hip, initial encounter: Secondary | ICD-10-CM | POA: Diagnosis not present

## 2017-04-26 DIAGNOSIS — Z96643 Presence of artificial hip joint, bilateral: Secondary | ICD-10-CM | POA: Diagnosis not present

## 2017-04-26 LAB — CBC
HCT: 26.5 % — ABNORMAL LOW (ref 36.0–46.0)
Hemoglobin: 8.3 g/dL — ABNORMAL LOW (ref 12.0–15.0)
MCH: 28.1 pg (ref 26.0–34.0)
MCHC: 31.3 g/dL (ref 30.0–36.0)
MCV: 89.8 fL (ref 78.0–100.0)
PLATELETS: 286 10*3/uL (ref 150–400)
RBC: 2.95 MIL/uL — AB (ref 3.87–5.11)
RDW: 15.3 % (ref 11.5–15.5)
WBC: 11.8 10*3/uL — ABNORMAL HIGH (ref 4.0–10.5)

## 2017-04-26 NOTE — Progress Notes (Signed)
Physical Therapy Treatment Patient Details Name: Ashley Ritter MRN: [id number] DOB: 11/24/1940 Today's Date: 04/26/2017    History of Present Illness Pt s/p R THR and with hx of vertigo    PT Comments    Pt continues cooperative but progressing slowly with mobility and requiring increased time for all activities.   Follow Up Recommendations  SNF     Equipment Recommendations  None recommended by PT    Recommendations for Other Services OT consult     Precautions / Restrictions Precautions Precautions: Fall Restrictions Weight Bearing Restrictions: No Other Position/Activity Restrictions: WBAT    Mobility  Bed Mobility Overal bed mobility: Needs Assistance Bed Mobility: Supine to Sit     Supine to sit: Min assist;+2 for physical assistance     General bed mobility comments: Increased time with cues for sequence and use of L LE to self assist, and physical assist to manage R LE and to bring trunk to upright  Transfers Overall transfer level: Needs assistance Equipment used: Rolling walker (2 wheeled) Transfers: Sit to/from Stand Sit to Stand: Min assist;From elevated surface         General transfer comment: 25% VC's for UE and LE placement.   Ambulation/Gait Ambulation/Gait assistance: Min assist Ambulation Distance (Feet): 22 Feet Assistive device: Rolling walker (2 wheeled) Gait Pattern/deviations: Step-to pattern;Decreased step length - left;Decreased step length - right;Shuffle;Trunk flexed Gait velocity: decreased Gait velocity interpretation: Below normal speed for age/gender General Gait Details: cues for sequence, posture, and position from RW.  Physical assist for balance and to advance L LE initially   Stairs            Wheelchair Mobility    Modified Rankin (Stroke Patients Only)       Balance Overall balance assessment: Needs assistance Sitting-balance support: No upper extremity supported;Feet supported Sitting  balance-Leahy Scale: Fair     Standing balance support: Bilateral upper extremity supported Standing balance-Leahy Scale: Poor                              Cognition Arousal/Alertness: Awake/alert Behavior During Therapy: WFL for tasks assessed/performed Overall Cognitive Status: Within Functional Limits for tasks assessed                                        Exercises Total Joint Exercises Ankle Circles/Pumps: AROM;Both;Supine;20 reps Quad Sets: AROM;Both;10 reps;Supine Heel Slides: AAROM;20 reps;Right;Supine Hip ABduction/ADduction: AAROM;Right;15 reps;Supine    General Comments        Pertinent Vitals/Pain Pain Assessment: 0-10 Pain Score: 7  Pain Location: R hip Pain Descriptors / Indicators: Aching;Sore Pain Intervention(s): Limited activity within patient's tolerance;Monitored during session;Premedicated before session;Ice applied    Home Living                      Prior Function            PT Goals (current goals can now be found in the care plan section) Acute Rehab PT Goals Patient Stated Goal: Rehab and then home alone PT Goal Formulation: With patient Time For Goal Achievement: 04/28/17 Potential to Achieve Goals: Good Progress towards PT goals: Progressing toward goals    Frequency    7X/week      PT Plan Current plan remains appropriate    Co-evaluation  AM-PAC PT "6 Clicks" Daily Activity  Outcome Measure  Difficulty turning over in bed (including adjusting bedclothes, sheets and blankets)?: Unable Difficulty moving from lying on back to sitting on the side of the bed? : Unable Difficulty sitting down on and standing up from a chair with arms (e.g., wheelchair, bedside commode, etc,.)?: Unable Help needed moving to and from a bed to chair (including a wheelchair)?: A Lot Help needed walking in hospital room?: A Lot Help needed climbing 3-5 steps with a railing? : Total 6 Click  Score: 8    End of Session Equipment Utilized During Treatment: Gait belt Activity Tolerance: Patient tolerated treatment well;Patient limited by fatigue;Patient limited by pain Patient left: with call bell/phone within reach;with family/visitor present;in bed Nurse Communication: Mobility status;Patient requests pain meds PT Visit Diagnosis: Unsteadiness on feet (R26.81);Difficulty in walking, not elsewhere classified (R26.2)     Time: [id number] PT Time Calculation (min) (ACUTE ONLY): 38 min  Charges:  $Gait Training: 8-22 mins $Therapeutic Exercise: 8-22 mins $Therapeutic Activity: 8-22 mins                    G Codes:       Pg [phone]    Anita Laguna 04/26/2017, 12:15 PM

## 2017-04-26 NOTE — Progress Notes (Signed)
Patient discharged; dc summary and prescriptions given to PTAR; Pt discharged to [facility] nursing [facility]; Report called to [facility] Rn of [facility]; Heather aware that patient is currently in route to facility.

## 2017-04-26 NOTE — Progress Notes (Signed)
Patient is set to discharge to [facility] SNF located in [place] today. Patient aware. Discharge packet given to RN. PTAR called for transport. Please call report to [phone] ex. [phone].   Stacy Gardner, LCSWA Clinical Social Worker [phone]

## 2017-04-26 NOTE — Progress Notes (Signed)
Subjective: 3 Days Post-Op Procedure(s) (LRB): RIGHT TOTAL HIP ARTHROPLASTY ANTERIOR APPROACH (Right)  Patient reports pain as mild to moderate.  Tolerating POs well.  Admits to BM.  Reports that she has worked well with therapy and is ready for D/C.  Denies fever, chills, N/V, SOB, CP.  Objective:   VITALS:  Temp:  [97.5 F (36.4 C)-98.3 F (36.8 C)] 98 F (36.7 C) (11/17 0700) Pulse Rate:  [51-57] 55 (11/17 0700) Resp:  [16-18] 16 (11/17 0700) BP: (112-118)/(43-50) 118/50 (11/17 0700) SpO2:  [99 %-100 %] 100 % (11/17 0700)  General: WDWN patient in NAD. Psych:  Appropriate mood and affect. Neuro:  A&O x 3, Moving all extremities, sensation intact to light touch HEENT:  EOMs intact Chest:  Even non-labored respirations Skin: Dressing C/D/I, no rashes or lesions Extremities: warm/dry, mild edema, no visible erythema or echymosis.  No lymphadenopathy. Pulses: Femoral 2+ MSK:  ROM: HF to 45 degrees, MMT: patient is able to perform quad set, (-) Homan's    LABS Recent Labs    04/24/17 0511 04/25/17 0511 04/26/17 0503  HGB 8.9* 8.4* 8.3*  WBC 11.8* 14.0* 11.8*  PLT 293 282 286   Recent Labs    04/24/17 0511 04/25/17 0511  NA 136 140  K 4.9 4.4  CL 104 108  CO2 27 29  BUN 22* 20  CREATININE 1.16* 0.86  GLUCOSE 126* 121*   No results for input(s): LABPT, INR in the last 72 hours.   Assessment/Plan: 3 Days Post-Op Procedure(s) (LRB): RIGHT TOTAL HIP ARTHROPLASTY ANTERIOR APPROACH (Right)  Patient seen in rounds for Dr. Lequita Halt D/C to SNF today Scripts on chart WBAT RLE Continue with therapy. Plan for 2 week outpatient post-op visit with Dr. Lequita Halt.  Alfredo Martinez, PA-C [facility] Orthopaedics Office:  [phone]

## 2017-04-26 NOTE — Clinical Social Work Placement (Signed)
   CLINICAL SOCIAL WORK PLACEMENT  NOTE  Date:  04/26/2017  Patient Details  Name: SHIKIRA MCGAHA MRN: [id number] Date of Birth: 1941/03/15  Clinical Social Work is seeking post-discharge placement for this patient at the Skilled  Nursing Facility level of care (*CSW will initial, date and re-position this form in  chart as items are completed):  Yes   Patient/family provided with [place] Clinical Social Work Department's list of facilities offering this level of care within the geographic area requested by the patient (or if unable, by the patient's family).  Yes   Patient/family informed of their freedom to choose among providers that offer the needed level of care, that participate in Medicare, Medicaid or managed care program needed by the patient, have an available bed and are willing to accept the patient.  Yes   Patient/family informed of [facility]'s ownership interest in [facility] and [facility], as well as of the fact that they are under no obligation to receive care at these facilities.  PASRR submitted to EDS on       PASRR number received on       Existing PASRR number confirmed on 04/25/17     FL2 transmitted to all facilities in geographic area requested by pt/family on       FL2 transmitted to all facilities within larger geographic area on 04/25/17     Patient informed that his/her managed care company has contracts with or will negotiate with certain facilities, including the following:  [facility], [place]     Yes   Patient/family informed of bed offers received.  Patient chooses bed at [facility]     Physician recommends and patient chooses bed at [place], [place]    Patient to be transferred to [facility], [place] on 04/26/17.  Patient to be transferred to facility by [facility]     Patient family notified on   of transfer.  Name of family member notified:        PHYSICIAN Please sign FL2     Additional Comment:     _______________________________________________ Donnie Coffin, LCSW 04/26/2017, 1:14 PM

## 2017-05-06 DIAGNOSIS — M1611 Unilateral primary osteoarthritis, right hip: Secondary | ICD-10-CM | POA: Diagnosis not present

## 2017-05-08 DIAGNOSIS — I10 Essential (primary) hypertension: Secondary | ICD-10-CM | POA: Diagnosis not present

## 2017-05-08 DIAGNOSIS — I739 Peripheral vascular disease, unspecified: Secondary | ICD-10-CM | POA: Diagnosis not present

## 2017-05-08 DIAGNOSIS — Z7982 Long term (current) use of aspirin: Secondary | ICD-10-CM | POA: Diagnosis not present

## 2017-05-08 DIAGNOSIS — Z96641 Presence of right artificial hip joint: Secondary | ICD-10-CM | POA: Diagnosis not present

## 2017-05-08 DIAGNOSIS — Z471 Aftercare following joint replacement surgery: Secondary | ICD-10-CM | POA: Diagnosis not present

## 2017-05-08 DIAGNOSIS — Z86718 Personal history of other venous thrombosis and embolism: Secondary | ICD-10-CM | POA: Diagnosis not present

## 2017-05-12 DIAGNOSIS — M1611 Unilateral primary osteoarthritis, right hip: Secondary | ICD-10-CM | POA: Diagnosis not present

## 2017-05-12 DIAGNOSIS — K5903 Drug induced constipation: Secondary | ICD-10-CM | POA: Diagnosis not present

## 2017-05-15 DIAGNOSIS — K5900 Constipation, unspecified: Secondary | ICD-10-CM | POA: Diagnosis not present

## 2017-05-16 DIAGNOSIS — Z7982 Long term (current) use of aspirin: Secondary | ICD-10-CM | POA: Diagnosis not present

## 2017-05-16 DIAGNOSIS — I739 Peripheral vascular disease, unspecified: Secondary | ICD-10-CM | POA: Diagnosis not present

## 2017-05-16 DIAGNOSIS — Z471 Aftercare following joint replacement surgery: Secondary | ICD-10-CM | POA: Diagnosis not present

## 2017-05-16 DIAGNOSIS — I10 Essential (primary) hypertension: Secondary | ICD-10-CM | POA: Diagnosis not present

## 2017-05-16 DIAGNOSIS — Z86718 Personal history of other venous thrombosis and embolism: Secondary | ICD-10-CM | POA: Diagnosis not present

## 2017-05-16 DIAGNOSIS — Z96641 Presence of right artificial hip joint: Secondary | ICD-10-CM | POA: Diagnosis not present

## 2017-05-22 DIAGNOSIS — Z471 Aftercare following joint replacement surgery: Secondary | ICD-10-CM | POA: Diagnosis not present

## 2017-05-22 DIAGNOSIS — I739 Peripheral vascular disease, unspecified: Secondary | ICD-10-CM | POA: Diagnosis not present

## 2017-05-22 DIAGNOSIS — Z7982 Long term (current) use of aspirin: Secondary | ICD-10-CM | POA: Diagnosis not present

## 2017-05-22 DIAGNOSIS — I10 Essential (primary) hypertension: Secondary | ICD-10-CM | POA: Diagnosis not present

## 2017-05-22 DIAGNOSIS — Z86718 Personal history of other venous thrombosis and embolism: Secondary | ICD-10-CM | POA: Diagnosis not present

## 2017-05-22 DIAGNOSIS — Z96641 Presence of right artificial hip joint: Secondary | ICD-10-CM | POA: Diagnosis not present

## 2017-05-23 DIAGNOSIS — Z7982 Long term (current) use of aspirin: Secondary | ICD-10-CM | POA: Diagnosis not present

## 2017-05-23 DIAGNOSIS — Z471 Aftercare following joint replacement surgery: Secondary | ICD-10-CM | POA: Diagnosis not present

## 2017-05-23 DIAGNOSIS — I10 Essential (primary) hypertension: Secondary | ICD-10-CM | POA: Diagnosis not present

## 2017-05-23 DIAGNOSIS — I739 Peripheral vascular disease, unspecified: Secondary | ICD-10-CM | POA: Diagnosis not present

## 2017-05-23 DIAGNOSIS — Z96641 Presence of right artificial hip joint: Secondary | ICD-10-CM | POA: Diagnosis not present

## 2017-05-23 DIAGNOSIS — Z86718 Personal history of other venous thrombosis and embolism: Secondary | ICD-10-CM | POA: Diagnosis not present

## 2017-05-26 DIAGNOSIS — I739 Peripheral vascular disease, unspecified: Secondary | ICD-10-CM | POA: Diagnosis not present

## 2017-05-26 DIAGNOSIS — Z96641 Presence of right artificial hip joint: Secondary | ICD-10-CM | POA: Diagnosis not present

## 2017-05-26 DIAGNOSIS — Z7982 Long term (current) use of aspirin: Secondary | ICD-10-CM | POA: Diagnosis not present

## 2017-05-26 DIAGNOSIS — Z471 Aftercare following joint replacement surgery: Secondary | ICD-10-CM | POA: Diagnosis not present

## 2017-05-26 DIAGNOSIS — I10 Essential (primary) hypertension: Secondary | ICD-10-CM | POA: Diagnosis not present

## 2017-05-26 DIAGNOSIS — Z86718 Personal history of other venous thrombosis and embolism: Secondary | ICD-10-CM | POA: Diagnosis not present

## 2017-05-27 DIAGNOSIS — M1611 Unilateral primary osteoarthritis, right hip: Secondary | ICD-10-CM | POA: Diagnosis not present

## 2017-06-06 DIAGNOSIS — Z86718 Personal history of other venous thrombosis and embolism: Secondary | ICD-10-CM | POA: Diagnosis not present

## 2017-06-06 DIAGNOSIS — Z471 Aftercare following joint replacement surgery: Secondary | ICD-10-CM | POA: Diagnosis not present

## 2017-06-06 DIAGNOSIS — I739 Peripheral vascular disease, unspecified: Secondary | ICD-10-CM | POA: Diagnosis not present

## 2017-06-06 DIAGNOSIS — Z7982 Long term (current) use of aspirin: Secondary | ICD-10-CM | POA: Diagnosis not present

## 2017-06-06 DIAGNOSIS — I10 Essential (primary) hypertension: Secondary | ICD-10-CM | POA: Diagnosis not present

## 2017-06-06 DIAGNOSIS — Z96641 Presence of right artificial hip joint: Secondary | ICD-10-CM | POA: Diagnosis not present

## 2017-06-17 DIAGNOSIS — L03314 Cellulitis of groin: Secondary | ICD-10-CM | POA: Diagnosis not present

## 2017-06-17 DIAGNOSIS — N390 Urinary tract infection, site not specified: Secondary | ICD-10-CM | POA: Diagnosis not present

## 2017-06-17 DIAGNOSIS — E782 Mixed hyperlipidemia: Secondary | ICD-10-CM | POA: Diagnosis not present

## 2017-06-17 DIAGNOSIS — M150 Primary generalized (osteo)arthritis: Secondary | ICD-10-CM | POA: Diagnosis not present

## 2017-06-17 DIAGNOSIS — N183 Chronic kidney disease, stage 3 (moderate): Secondary | ICD-10-CM | POA: Diagnosis not present

## 2017-06-17 DIAGNOSIS — R7303 Prediabetes: Secondary | ICD-10-CM | POA: Diagnosis not present

## 2017-06-17 DIAGNOSIS — K5700 Diverticulitis of small intestine with perforation and abscess without bleeding: Secondary | ICD-10-CM | POA: Diagnosis not present

## 2017-06-17 DIAGNOSIS — J449 Chronic obstructive pulmonary disease, unspecified: Secondary | ICD-10-CM | POA: Diagnosis not present

## 2017-06-17 DIAGNOSIS — I10 Essential (primary) hypertension: Secondary | ICD-10-CM | POA: Diagnosis not present

## 2017-06-17 DIAGNOSIS — M1711 Unilateral primary osteoarthritis, right knee: Secondary | ICD-10-CM | POA: Diagnosis not present

## 2017-06-27 DIAGNOSIS — D509 Iron deficiency anemia, unspecified: Secondary | ICD-10-CM | POA: Diagnosis not present

## 2017-07-17 DIAGNOSIS — M170 Bilateral primary osteoarthritis of knee: Secondary | ICD-10-CM | POA: Diagnosis not present

## 2017-07-17 DIAGNOSIS — M25562 Pain in left knee: Secondary | ICD-10-CM | POA: Diagnosis not present

## 2017-07-17 DIAGNOSIS — M25561 Pain in right knee: Secondary | ICD-10-CM | POA: Diagnosis not present

## 2017-07-21 ENCOUNTER — Ambulatory Visit: Payer: Medicare Other | Admitting: Cardiology

## 2017-07-24 DIAGNOSIS — M170 Bilateral primary osteoarthritis of knee: Secondary | ICD-10-CM | POA: Diagnosis not present

## 2017-07-24 DIAGNOSIS — M25561 Pain in right knee: Secondary | ICD-10-CM | POA: Diagnosis not present

## 2017-07-24 DIAGNOSIS — M25562 Pain in left knee: Secondary | ICD-10-CM | POA: Diagnosis not present

## 2017-07-31 DIAGNOSIS — M25562 Pain in left knee: Secondary | ICD-10-CM | POA: Diagnosis not present

## 2017-07-31 DIAGNOSIS — M25561 Pain in right knee: Secondary | ICD-10-CM | POA: Diagnosis not present

## 2017-07-31 DIAGNOSIS — M170 Bilateral primary osteoarthritis of knee: Secondary | ICD-10-CM | POA: Diagnosis not present

## 2017-08-07 DIAGNOSIS — M25562 Pain in left knee: Secondary | ICD-10-CM | POA: Diagnosis not present

## 2017-08-07 DIAGNOSIS — M170 Bilateral primary osteoarthritis of knee: Secondary | ICD-10-CM | POA: Diagnosis not present

## 2017-08-07 DIAGNOSIS — M25561 Pain in right knee: Secondary | ICD-10-CM | POA: Diagnosis not present

## 2017-08-14 DIAGNOSIS — M170 Bilateral primary osteoarthritis of knee: Secondary | ICD-10-CM | POA: Diagnosis not present

## 2017-08-14 DIAGNOSIS — M25562 Pain in left knee: Secondary | ICD-10-CM | POA: Diagnosis not present

## 2017-08-14 DIAGNOSIS — M25561 Pain in right knee: Secondary | ICD-10-CM | POA: Diagnosis not present

## 2017-09-29 DIAGNOSIS — H40003 Preglaucoma, unspecified, bilateral: Secondary | ICD-10-CM | POA: Diagnosis not present

## 2017-10-15 DIAGNOSIS — R7303 Prediabetes: Secondary | ICD-10-CM | POA: Diagnosis not present

## 2017-10-15 DIAGNOSIS — E782 Mixed hyperlipidemia: Secondary | ICD-10-CM | POA: Diagnosis not present

## 2017-10-15 DIAGNOSIS — J449 Chronic obstructive pulmonary disease, unspecified: Secondary | ICD-10-CM | POA: Diagnosis not present

## 2017-10-15 DIAGNOSIS — I10 Essential (primary) hypertension: Secondary | ICD-10-CM | POA: Diagnosis not present

## 2017-10-15 DIAGNOSIS — M150 Primary generalized (osteo)arthritis: Secondary | ICD-10-CM | POA: Diagnosis not present

## 2017-10-15 DIAGNOSIS — M1711 Unilateral primary osteoarthritis, right knee: Secondary | ICD-10-CM | POA: Diagnosis not present

## 2017-10-15 DIAGNOSIS — K5700 Diverticulitis of small intestine with perforation and abscess without bleeding: Secondary | ICD-10-CM | POA: Diagnosis not present

## 2017-10-15 DIAGNOSIS — N183 Chronic kidney disease, stage 3 (moderate): Secondary | ICD-10-CM | POA: Diagnosis not present

## 2017-11-13 DIAGNOSIS — D509 Iron deficiency anemia, unspecified: Secondary | ICD-10-CM | POA: Diagnosis not present

## 2017-11-26 DIAGNOSIS — M1711 Unilateral primary osteoarthritis, right knee: Secondary | ICD-10-CM | POA: Diagnosis not present

## 2017-11-26 DIAGNOSIS — M25562 Pain in left knee: Secondary | ICD-10-CM | POA: Diagnosis not present

## 2017-11-26 DIAGNOSIS — M25561 Pain in right knee: Secondary | ICD-10-CM | POA: Diagnosis not present

## 2017-11-26 DIAGNOSIS — M170 Bilateral primary osteoarthritis of knee: Secondary | ICD-10-CM | POA: Diagnosis not present

## 2017-12-03 DIAGNOSIS — M25562 Pain in left knee: Secondary | ICD-10-CM | POA: Diagnosis not present

## 2017-12-03 DIAGNOSIS — M1712 Unilateral primary osteoarthritis, left knee: Secondary | ICD-10-CM | POA: Diagnosis not present

## 2017-12-08 DIAGNOSIS — M1711 Unilateral primary osteoarthritis, right knee: Secondary | ICD-10-CM | POA: Diagnosis not present

## 2017-12-08 DIAGNOSIS — M25561 Pain in right knee: Secondary | ICD-10-CM | POA: Diagnosis not present

## 2017-12-16 DIAGNOSIS — M25562 Pain in left knee: Secondary | ICD-10-CM | POA: Diagnosis not present

## 2017-12-16 DIAGNOSIS — M1712 Unilateral primary osteoarthritis, left knee: Secondary | ICD-10-CM | POA: Diagnosis not present

## 2017-12-17 DIAGNOSIS — M25561 Pain in right knee: Secondary | ICD-10-CM | POA: Diagnosis not present

## 2017-12-17 DIAGNOSIS — M1711 Unilateral primary osteoarthritis, right knee: Secondary | ICD-10-CM | POA: Diagnosis not present

## 2017-12-23 DIAGNOSIS — M25562 Pain in left knee: Secondary | ICD-10-CM | POA: Diagnosis not present

## 2017-12-23 DIAGNOSIS — M1712 Unilateral primary osteoarthritis, left knee: Secondary | ICD-10-CM | POA: Diagnosis not present

## 2017-12-24 DIAGNOSIS — M25561 Pain in right knee: Secondary | ICD-10-CM | POA: Diagnosis not present

## 2017-12-24 DIAGNOSIS — M1711 Unilateral primary osteoarthritis, right knee: Secondary | ICD-10-CM | POA: Diagnosis not present

## 2017-12-30 DIAGNOSIS — M25562 Pain in left knee: Secondary | ICD-10-CM | POA: Diagnosis not present

## 2017-12-30 DIAGNOSIS — M1712 Unilateral primary osteoarthritis, left knee: Secondary | ICD-10-CM | POA: Diagnosis not present

## 2017-12-31 DIAGNOSIS — M1711 Unilateral primary osteoarthritis, right knee: Secondary | ICD-10-CM | POA: Diagnosis not present

## 2017-12-31 DIAGNOSIS — M25561 Pain in right knee: Secondary | ICD-10-CM | POA: Diagnosis not present

## 2018-01-07 DIAGNOSIS — M1712 Unilateral primary osteoarthritis, left knee: Secondary | ICD-10-CM | POA: Diagnosis not present

## 2018-01-07 DIAGNOSIS — M25562 Pain in left knee: Secondary | ICD-10-CM | POA: Diagnosis not present

## 2018-01-26 ENCOUNTER — Ambulatory Visit: Payer: Medicare Other | Admitting: Cardiology

## 2018-01-27 ENCOUNTER — Encounter: Payer: Self-pay | Admitting: Cardiology

## 2018-01-27 ENCOUNTER — Ambulatory Visit (INDEPENDENT_AMBULATORY_CARE_PROVIDER_SITE_OTHER): Payer: Medicare Other | Admitting: Cardiology

## 2018-01-27 VITALS — BP 126/72 | HR 54 | Ht 61.0 in | Wt 195.0 lb

## 2018-01-27 DIAGNOSIS — I10 Essential (primary) hypertension: Secondary | ICD-10-CM | POA: Diagnosis not present

## 2018-01-27 DIAGNOSIS — R0789 Other chest pain: Secondary | ICD-10-CM

## 2018-01-27 DIAGNOSIS — I739 Peripheral vascular disease, unspecified: Secondary | ICD-10-CM

## 2018-01-27 DIAGNOSIS — E785 Hyperlipidemia, unspecified: Secondary | ICD-10-CM

## 2018-01-27 NOTE — Patient Instructions (Signed)
Medication Instructions:  Your physician recommends that you continue on your current medications as directed. Please refer to the Current Medication list given to you today.  Labwork: None  Testing/Procedures: Your physician has requested that you have a carotid duplex. This test is an ultrasound of the carotid arteries in your neck. It looks at blood flow through these arteries that supply the brain with blood. Allow one hour for this exam. There are no restrictions or special instructions.  Follow-Up: Your physician recommends that you schedule a follow-up appointment in: 6 months  Any Other Special Instructions Will Be Listed Below (If Applicable).     If you need a refill on your cardiac medications before your next appointment, please call your pharmacy.   [facility]  Garey Ham, RN, BSN

## 2018-01-27 NOTE — Progress Notes (Signed)
Cardiology Office Note:    Date:  01/27/2018   ID:  Ashley Ritter, DOB 06-Mar-1941, MRN [id number]  PCP:  Abigail Miyamoto, MD  Cardiologist:  Gypsy Balsam, MD    Referring MD: Abigail Miyamoto,*   No chief complaint on file. Doing well  History of Present Illness:    Ashley Ritter is a 77 y.o. female with essential hypertension peripheral vascular disease also osteoarthritis last time I spoke to her it was before her elective hip replacement surgery she ended up having surgery 20 with no difficulties doing well right now recovering complain of having left knee pain this time.  No chest pain tightness squeezing pressure burning chest.  She still very concerned about pulsating mass in the left supraclavicular area.  She wants to have ultrasound.  Past Medical History:  Diagnosis Date  . Atypical chest pain   . Carotid bruit   . Hyperlipidemia   . Hypertension   . Vertigo     Past Surgical History:  Procedure Laterality Date  . GASTRIC RESTRICTION SURGERY    . SHOULDER SURGERY    . 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 TUBAL LIGATION      Current Medications: Current Meds  Medication Sig  . ferrous fumarate-iron polysaccharide complex (TANDEM) 162-115.2 MG CAPS capsule Take 1 capsule by mouth daily.  Marland Kitchen triamterene-hydrochlorothiazide (DYAZIDE) 37.5-25 MG capsule Take 1 capsule by mouth daily.     Allergies:   Penicillins and Oxycodone   Social History   Socioeconomic History  . Marital status: Married    Spouse name: Not on file  . Number of children: Not on file  . Years of education: Not on file  . Highest education level: Not on file  Occupational History  . Not on file  Social Needs  . Financial resource strain: Not on file  . Food insecurity:    Worry: Not on file    Inability:  Not on file  . Transportation needs:    Medical: Not on file    Non-medical: Not on file  Tobacco Use  . Smoking status: Former Smoker    Last attempt to quit: 1983    Years since quitting: 36.6  . Smokeless tobacco: Never Used  Substance and Sexual Activity  . Alcohol use: No  . Drug use: No  . Sexual activity: Not on file  Lifestyle  . Physical activity:    Days per week: Not on file    Minutes per session: Not on file  . Stress: Not on file  Relationships  . Social connections:    Talks on phone: Not on file    Gets together: Not on file    Attends religious service: Not on file    Active member of club or organization: Not on file    Attends meetings of clubs or organizations: Not on file    Relationship status: Not on file  Other Topics Concern  . Not on file  Social History Narrative  . Not on file     Family History: The patient's family history includes Diabetes in her mother; Heart attack in her father; Heart disease in her father; Hyperlipidemia in her brother; Hypertension in her brother. ROS:   Please see the history of present illness.    All 14 point review of systems negative except as described per  history of present illness  EKGs/Labs/Other Studies Reviewed:      Recent Labs: 04/17/2017: ALT 9 04/25/2017: BUN 20; Creatinine, Ser 0.86; Potassium 4.4; Sodium 140 04/26/2017: Hemoglobin 8.3; Platelets 286  Recent Lipid Panel    Component Value Date/Time   TRIG 63 11/25/2016 0642    Physical Exam:    VS:  BP 126/72 (BP Location: Right Arm, Patient Position: Sitting, Cuff Size: Normal)   Pulse (!) 54   Ht 5\' 1"  (1.549 m)   Wt 195 lb (88.5 kg)   SpO2 97%   BMI 36.84 kg/m     Wt Readings from Last 3 Encounters:  01/27/18 195 lb (88.5 kg)  04/23/17 184 lb (83.5 kg)  04/17/17 184 lb 4 oz (83.6 kg)     GEN:  Well nourished, well developed in no acute distress HEENT: Normal NECK: No JVD; No carotid bruits LYMPHATICS: No  lymphadenopathy CARDIAC: RRR, no murmurs, no rubs, no gallops RESPIRATORY:  Clear to auscultation without rales, wheezing or rhonchi  ABDOMEN: Soft, non-tender, non-distended MUSCULOSKELETAL:  No edema; No deformity  SKIN: Warm and dry LOWER EXTREMITIES: no swelling NEUROLOGIC:  Alert and oriented x 3 PSYCHIATRIC:  Normal affect   ASSESSMENT:    1. Essential hypertension   2. Peripheral vascular disease (HCC)   3. Dyslipidemia   4. Atypical chest pain    PLAN:    In order of problems listed above:  1. Essential hypertension blood pressure well controlled we will continue present management. 2. Peripheral vascular disease we will schedule her to have carotid ultrasound 3. Dyslipidemia we will try to contact primary care physician to get her fasting lipid profile she was told that everything is fine there 4. Atypical chest pain denies having any.  Stress test reviewed was negative.   Medication Adjustments/Labs and Tests Ordered: Current medicines are reviewed at length with the patient today.  Concerns regarding medicines are outlined above.  No orders of the defined types were placed in this encounter.  Medication changes: No orders of the defined types were placed in this encounter.   Signed, Georgeanna Lea, MD, [facility] 01/27/2018 4:22 PM    [facility]

## 2018-02-02 DIAGNOSIS — H40003 Preglaucoma, unspecified, bilateral: Secondary | ICD-10-CM | POA: Diagnosis not present

## 2018-02-11 ENCOUNTER — Ambulatory Visit (INDEPENDENT_AMBULATORY_CARE_PROVIDER_SITE_OTHER): Payer: Medicare Other

## 2018-02-11 DIAGNOSIS — R55 Syncope and collapse: Secondary | ICD-10-CM | POA: Diagnosis not present

## 2018-02-11 DIAGNOSIS — E785 Hyperlipidemia, unspecified: Secondary | ICD-10-CM

## 2018-02-11 DIAGNOSIS — I739 Peripheral vascular disease, unspecified: Secondary | ICD-10-CM

## 2018-02-11 NOTE — Progress Notes (Signed)
Complete carotid duplex has been performed. Difficult study, No ICA stenosis was seen.  Jimmy Vonne Mcdanel RDCS

## 2018-02-24 DIAGNOSIS — I10 Essential (primary) hypertension: Secondary | ICD-10-CM | POA: Diagnosis not present

## 2018-02-24 DIAGNOSIS — J449 Chronic obstructive pulmonary disease, unspecified: Secondary | ICD-10-CM | POA: Diagnosis not present

## 2018-02-24 DIAGNOSIS — M150 Primary generalized (osteo)arthritis: Secondary | ICD-10-CM | POA: Diagnosis not present

## 2018-02-24 DIAGNOSIS — R7303 Prediabetes: Secondary | ICD-10-CM | POA: Diagnosis not present

## 2018-02-24 DIAGNOSIS — E782 Mixed hyperlipidemia: Secondary | ICD-10-CM | POA: Diagnosis not present

## 2018-02-24 DIAGNOSIS — Z6833 Body mass index (BMI) 33.0-33.9, adult: Secondary | ICD-10-CM | POA: Diagnosis not present

## 2018-02-24 DIAGNOSIS — N183 Chronic kidney disease, stage 3 (moderate): Secondary | ICD-10-CM | POA: Diagnosis not present

## 2018-02-26 DIAGNOSIS — I10 Essential (primary) hypertension: Secondary | ICD-10-CM | POA: Diagnosis not present

## 2018-02-26 DIAGNOSIS — R7303 Prediabetes: Secondary | ICD-10-CM | POA: Diagnosis not present

## 2018-02-26 DIAGNOSIS — E782 Mixed hyperlipidemia: Secondary | ICD-10-CM | POA: Diagnosis not present

## 2018-03-11 DIAGNOSIS — M25651 Stiffness of right hip, not elsewhere classified: Secondary | ICD-10-CM | POA: Diagnosis not present

## 2018-03-11 DIAGNOSIS — M6281 Muscle weakness (generalized): Secondary | ICD-10-CM | POA: Diagnosis not present

## 2018-03-11 DIAGNOSIS — R2689 Other abnormalities of gait and mobility: Secondary | ICD-10-CM | POA: Diagnosis not present

## 2018-03-11 DIAGNOSIS — M25551 Pain in right hip: Secondary | ICD-10-CM | POA: Diagnosis not present

## 2018-03-16 DIAGNOSIS — K922 Gastrointestinal hemorrhage, unspecified: Secondary | ICD-10-CM | POA: Diagnosis not present

## 2018-03-16 DIAGNOSIS — D508 Other iron deficiency anemias: Secondary | ICD-10-CM | POA: Diagnosis not present

## 2018-03-16 DIAGNOSIS — D509 Iron deficiency anemia, unspecified: Secondary | ICD-10-CM | POA: Diagnosis not present

## 2018-03-17 DIAGNOSIS — M25651 Stiffness of right hip, not elsewhere classified: Secondary | ICD-10-CM | POA: Diagnosis not present

## 2018-03-17 DIAGNOSIS — M25551 Pain in right hip: Secondary | ICD-10-CM | POA: Diagnosis not present

## 2018-03-17 DIAGNOSIS — R2689 Other abnormalities of gait and mobility: Secondary | ICD-10-CM | POA: Diagnosis not present

## 2018-03-17 DIAGNOSIS — M6281 Muscle weakness (generalized): Secondary | ICD-10-CM | POA: Diagnosis not present

## 2018-03-19 DIAGNOSIS — M25651 Stiffness of right hip, not elsewhere classified: Secondary | ICD-10-CM | POA: Diagnosis not present

## 2018-03-19 DIAGNOSIS — M6281 Muscle weakness (generalized): Secondary | ICD-10-CM | POA: Diagnosis not present

## 2018-03-19 DIAGNOSIS — R2689 Other abnormalities of gait and mobility: Secondary | ICD-10-CM | POA: Diagnosis not present

## 2018-03-19 DIAGNOSIS — M25551 Pain in right hip: Secondary | ICD-10-CM | POA: Diagnosis not present

## 2018-03-24 DIAGNOSIS — M6281 Muscle weakness (generalized): Secondary | ICD-10-CM | POA: Diagnosis not present

## 2018-03-24 DIAGNOSIS — R2689 Other abnormalities of gait and mobility: Secondary | ICD-10-CM | POA: Diagnosis not present

## 2018-03-24 DIAGNOSIS — M25551 Pain in right hip: Secondary | ICD-10-CM | POA: Diagnosis not present

## 2018-03-24 DIAGNOSIS — M25651 Stiffness of right hip, not elsewhere classified: Secondary | ICD-10-CM | POA: Diagnosis not present

## 2018-03-31 DIAGNOSIS — M25651 Stiffness of right hip, not elsewhere classified: Secondary | ICD-10-CM | POA: Diagnosis not present

## 2018-03-31 DIAGNOSIS — R2689 Other abnormalities of gait and mobility: Secondary | ICD-10-CM | POA: Diagnosis not present

## 2018-03-31 DIAGNOSIS — M6281 Muscle weakness (generalized): Secondary | ICD-10-CM | POA: Diagnosis not present

## 2018-03-31 DIAGNOSIS — M25551 Pain in right hip: Secondary | ICD-10-CM | POA: Diagnosis not present

## 2018-04-02 DIAGNOSIS — M25551 Pain in right hip: Secondary | ICD-10-CM | POA: Diagnosis not present

## 2018-04-02 DIAGNOSIS — R2689 Other abnormalities of gait and mobility: Secondary | ICD-10-CM | POA: Diagnosis not present

## 2018-04-02 DIAGNOSIS — M25651 Stiffness of right hip, not elsewhere classified: Secondary | ICD-10-CM | POA: Diagnosis not present

## 2018-04-02 DIAGNOSIS — M6281 Muscle weakness (generalized): Secondary | ICD-10-CM | POA: Diagnosis not present

## 2018-04-04 DIAGNOSIS — Z1231 Encounter for screening mammogram for malignant neoplasm of breast: Secondary | ICD-10-CM | POA: Diagnosis not present

## 2018-04-07 DIAGNOSIS — M6281 Muscle weakness (generalized): Secondary | ICD-10-CM | POA: Diagnosis not present

## 2018-04-07 DIAGNOSIS — M25551 Pain in right hip: Secondary | ICD-10-CM | POA: Diagnosis not present

## 2018-04-07 DIAGNOSIS — R2689 Other abnormalities of gait and mobility: Secondary | ICD-10-CM | POA: Diagnosis not present

## 2018-04-07 DIAGNOSIS — M25651 Stiffness of right hip, not elsewhere classified: Secondary | ICD-10-CM | POA: Diagnosis not present

## 2018-04-09 DIAGNOSIS — M1611 Unilateral primary osteoarthritis, right hip: Secondary | ICD-10-CM | POA: Diagnosis not present

## 2018-04-14 DIAGNOSIS — M6281 Muscle weakness (generalized): Secondary | ICD-10-CM | POA: Diagnosis not present

## 2018-04-14 DIAGNOSIS — M25651 Stiffness of right hip, not elsewhere classified: Secondary | ICD-10-CM | POA: Diagnosis not present

## 2018-04-14 DIAGNOSIS — M25551 Pain in right hip: Secondary | ICD-10-CM | POA: Diagnosis not present

## 2018-04-14 DIAGNOSIS — R2689 Other abnormalities of gait and mobility: Secondary | ICD-10-CM | POA: Diagnosis not present

## 2018-04-15 DIAGNOSIS — M25562 Pain in left knee: Secondary | ICD-10-CM | POA: Diagnosis not present

## 2018-04-15 DIAGNOSIS — M25561 Pain in right knee: Secondary | ICD-10-CM | POA: Diagnosis not present

## 2018-04-15 DIAGNOSIS — M1712 Unilateral primary osteoarthritis, left knee: Secondary | ICD-10-CM | POA: Diagnosis not present

## 2018-04-15 DIAGNOSIS — M170 Bilateral primary osteoarthritis of knee: Secondary | ICD-10-CM | POA: Diagnosis not present

## 2018-04-16 DIAGNOSIS — M25551 Pain in right hip: Secondary | ICD-10-CM | POA: Diagnosis not present

## 2018-04-16 DIAGNOSIS — R2689 Other abnormalities of gait and mobility: Secondary | ICD-10-CM | POA: Diagnosis not present

## 2018-04-16 DIAGNOSIS — M25651 Stiffness of right hip, not elsewhere classified: Secondary | ICD-10-CM | POA: Diagnosis not present

## 2018-04-16 DIAGNOSIS — M6281 Muscle weakness (generalized): Secondary | ICD-10-CM | POA: Diagnosis not present

## 2018-04-21 DIAGNOSIS — M25551 Pain in right hip: Secondary | ICD-10-CM | POA: Diagnosis not present

## 2018-04-21 DIAGNOSIS — R2689 Other abnormalities of gait and mobility: Secondary | ICD-10-CM | POA: Diagnosis not present

## 2018-04-21 DIAGNOSIS — M25651 Stiffness of right hip, not elsewhere classified: Secondary | ICD-10-CM | POA: Diagnosis not present

## 2018-04-21 DIAGNOSIS — M6281 Muscle weakness (generalized): Secondary | ICD-10-CM | POA: Diagnosis not present

## 2018-04-22 DIAGNOSIS — M1712 Unilateral primary osteoarthritis, left knee: Secondary | ICD-10-CM | POA: Diagnosis not present

## 2018-04-22 DIAGNOSIS — M25562 Pain in left knee: Secondary | ICD-10-CM | POA: Diagnosis not present

## 2018-04-23 DIAGNOSIS — M6281 Muscle weakness (generalized): Secondary | ICD-10-CM | POA: Diagnosis not present

## 2018-04-23 DIAGNOSIS — M25651 Stiffness of right hip, not elsewhere classified: Secondary | ICD-10-CM | POA: Diagnosis not present

## 2018-04-23 DIAGNOSIS — M25551 Pain in right hip: Secondary | ICD-10-CM | POA: Diagnosis not present

## 2018-04-23 DIAGNOSIS — R2689 Other abnormalities of gait and mobility: Secondary | ICD-10-CM | POA: Diagnosis not present

## 2018-04-28 DIAGNOSIS — R2689 Other abnormalities of gait and mobility: Secondary | ICD-10-CM | POA: Diagnosis not present

## 2018-04-28 DIAGNOSIS — M25551 Pain in right hip: Secondary | ICD-10-CM | POA: Diagnosis not present

## 2018-04-28 DIAGNOSIS — M25651 Stiffness of right hip, not elsewhere classified: Secondary | ICD-10-CM | POA: Diagnosis not present

## 2018-04-28 DIAGNOSIS — M6281 Muscle weakness (generalized): Secondary | ICD-10-CM | POA: Diagnosis not present

## 2018-04-29 DIAGNOSIS — M1712 Unilateral primary osteoarthritis, left knee: Secondary | ICD-10-CM | POA: Diagnosis not present

## 2018-04-29 DIAGNOSIS — M25562 Pain in left knee: Secondary | ICD-10-CM | POA: Diagnosis not present

## 2018-04-30 DIAGNOSIS — R2689 Other abnormalities of gait and mobility: Secondary | ICD-10-CM | POA: Diagnosis not present

## 2018-04-30 DIAGNOSIS — M6281 Muscle weakness (generalized): Secondary | ICD-10-CM | POA: Diagnosis not present

## 2018-04-30 DIAGNOSIS — M25551 Pain in right hip: Secondary | ICD-10-CM | POA: Diagnosis not present

## 2018-04-30 DIAGNOSIS — M25651 Stiffness of right hip, not elsewhere classified: Secondary | ICD-10-CM | POA: Diagnosis not present

## 2018-05-05 DIAGNOSIS — M1712 Unilateral primary osteoarthritis, left knee: Secondary | ICD-10-CM | POA: Diagnosis not present

## 2018-05-05 DIAGNOSIS — M25562 Pain in left knee: Secondary | ICD-10-CM | POA: Diagnosis not present

## 2018-05-12 DIAGNOSIS — M6281 Muscle weakness (generalized): Secondary | ICD-10-CM | POA: Diagnosis not present

## 2018-05-12 DIAGNOSIS — M25651 Stiffness of right hip, not elsewhere classified: Secondary | ICD-10-CM | POA: Diagnosis not present

## 2018-05-12 DIAGNOSIS — R2689 Other abnormalities of gait and mobility: Secondary | ICD-10-CM | POA: Diagnosis not present

## 2018-05-12 DIAGNOSIS — M25551 Pain in right hip: Secondary | ICD-10-CM | POA: Diagnosis not present

## 2018-05-13 DIAGNOSIS — M25562 Pain in left knee: Secondary | ICD-10-CM | POA: Diagnosis not present

## 2018-05-13 DIAGNOSIS — M1712 Unilateral primary osteoarthritis, left knee: Secondary | ICD-10-CM | POA: Diagnosis not present

## 2018-05-14 DIAGNOSIS — R2689 Other abnormalities of gait and mobility: Secondary | ICD-10-CM | POA: Diagnosis not present

## 2018-05-14 DIAGNOSIS — M25651 Stiffness of right hip, not elsewhere classified: Secondary | ICD-10-CM | POA: Diagnosis not present

## 2018-05-14 DIAGNOSIS — M25551 Pain in right hip: Secondary | ICD-10-CM | POA: Diagnosis not present

## 2018-05-14 DIAGNOSIS — M6281 Muscle weakness (generalized): Secondary | ICD-10-CM | POA: Diagnosis not present

## 2018-05-20 DIAGNOSIS — M170 Bilateral primary osteoarthritis of knee: Secondary | ICD-10-CM | POA: Diagnosis not present

## 2018-05-20 DIAGNOSIS — M25561 Pain in right knee: Secondary | ICD-10-CM | POA: Diagnosis not present

## 2018-05-27 DIAGNOSIS — M1711 Unilateral primary osteoarthritis, right knee: Secondary | ICD-10-CM | POA: Diagnosis not present

## 2018-05-27 DIAGNOSIS — M25561 Pain in right knee: Secondary | ICD-10-CM | POA: Diagnosis not present

## 2018-06-04 IMAGING — NM NM MYOCAR MULTI W/ SPECT
3 series · 18 of 18 positions shown · non-contrast
Comparison: none

[Series 1: rest_(id)_sa · 6.5mm · 6.51mm/px · 6 of 64 frames shown]
[frame 6/64]
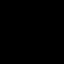
[frame 16/64]
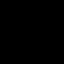
[frame 27/64]
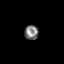
[frame 38/64]
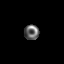
[frame 48/64]
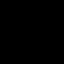
[frame 59/64]
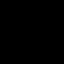

[Series 1: stress_(id)_sa · 6.5mm · 6.51mm/px · 6 of 512 frames shown (1 of 2)]
[frame 43/512]
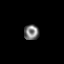
[frame 128/512]
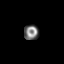
[frame 214/512]
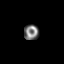
[frame 299/512]
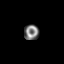
[frame 384/512]
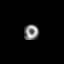
[frame 470/512]
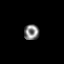

[Series 1: stress_(id)_sa · 6.5mm · 6.51mm/px · 6 of 64 frames shown (2 of 2)]
[frame 6/64]
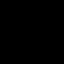
[frame 16/64]
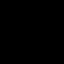
[frame 27/64]
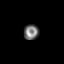
[frame 38/64]
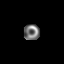
[frame 48/64]
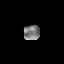
[frame 59/64]
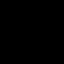

[18 of 18 positions shown; findings below may reference images not displayed]

Canned report from images found in remote index.

Refer to host system for actual result text.

## 2018-06-11 DIAGNOSIS — M1711 Unilateral primary osteoarthritis, right knee: Secondary | ICD-10-CM | POA: Diagnosis not present

## 2018-06-11 DIAGNOSIS — M25561 Pain in right knee: Secondary | ICD-10-CM | POA: Diagnosis not present

## 2018-06-17 DIAGNOSIS — M25561 Pain in right knee: Secondary | ICD-10-CM | POA: Diagnosis not present

## 2018-06-17 DIAGNOSIS — M1711 Unilateral primary osteoarthritis, right knee: Secondary | ICD-10-CM | POA: Diagnosis not present

## 2018-06-24 DIAGNOSIS — M1711 Unilateral primary osteoarthritis, right knee: Secondary | ICD-10-CM | POA: Diagnosis not present

## 2018-06-24 DIAGNOSIS — M25561 Pain in right knee: Secondary | ICD-10-CM | POA: Diagnosis not present

## 2018-07-01 DIAGNOSIS — Z6832 Body mass index (BMI) 32.0-32.9, adult: Secondary | ICD-10-CM | POA: Diagnosis not present

## 2018-07-01 DIAGNOSIS — E782 Mixed hyperlipidemia: Secondary | ICD-10-CM | POA: Diagnosis not present

## 2018-07-01 DIAGNOSIS — I129 Hypertensive chronic kidney disease with stage 1 through stage 4 chronic kidney disease, or unspecified chronic kidney disease: Secondary | ICD-10-CM | POA: Diagnosis not present

## 2018-07-01 DIAGNOSIS — K210 Gastro-esophageal reflux disease with esophagitis: Secondary | ICD-10-CM | POA: Diagnosis not present

## 2018-07-01 DIAGNOSIS — R7303 Prediabetes: Secondary | ICD-10-CM | POA: Diagnosis not present

## 2018-07-01 DIAGNOSIS — J449 Chronic obstructive pulmonary disease, unspecified: Secondary | ICD-10-CM | POA: Diagnosis not present

## 2018-07-01 DIAGNOSIS — N183 Chronic kidney disease, stage 3 (moderate): Secondary | ICD-10-CM | POA: Diagnosis not present

## 2018-07-01 DIAGNOSIS — M150 Primary generalized (osteo)arthritis: Secondary | ICD-10-CM | POA: Diagnosis not present

## 2018-07-01 DIAGNOSIS — E8801 Alpha-1-antitrypsin deficiency: Secondary | ICD-10-CM | POA: Diagnosis not present

## 2018-07-06 DIAGNOSIS — H40003 Preglaucoma, unspecified, bilateral: Secondary | ICD-10-CM | POA: Diagnosis not present

## 2018-07-21 DIAGNOSIS — D509 Iron deficiency anemia, unspecified: Secondary | ICD-10-CM | POA: Diagnosis not present

## 2018-09-30 DIAGNOSIS — M1712 Unilateral primary osteoarthritis, left knee: Secondary | ICD-10-CM | POA: Diagnosis not present

## 2018-09-30 DIAGNOSIS — M25562 Pain in left knee: Secondary | ICD-10-CM | POA: Diagnosis not present

## 2018-09-30 DIAGNOSIS — M1711 Unilateral primary osteoarthritis, right knee: Secondary | ICD-10-CM | POA: Diagnosis not present

## 2018-09-30 DIAGNOSIS — M25561 Pain in right knee: Secondary | ICD-10-CM | POA: Diagnosis not present

## 2018-09-30 DIAGNOSIS — M170 Bilateral primary osteoarthritis of knee: Secondary | ICD-10-CM | POA: Diagnosis not present

## 2018-10-08 DIAGNOSIS — K5732 Diverticulitis of large intestine without perforation or abscess without bleeding: Secondary | ICD-10-CM | POA: Diagnosis not present

## 2018-10-14 DIAGNOSIS — M170 Bilateral primary osteoarthritis of knee: Secondary | ICD-10-CM | POA: Diagnosis not present

## 2018-10-14 DIAGNOSIS — M25561 Pain in right knee: Secondary | ICD-10-CM | POA: Diagnosis not present

## 2018-10-14 DIAGNOSIS — M25562 Pain in left knee: Secondary | ICD-10-CM | POA: Diagnosis not present

## 2018-12-03 DIAGNOSIS — Z6833 Body mass index (BMI) 33.0-33.9, adult: Secondary | ICD-10-CM | POA: Diagnosis not present

## 2018-12-03 DIAGNOSIS — Z0000 Encounter for general adult medical examination without abnormal findings: Secondary | ICD-10-CM | POA: Diagnosis not present

## 2018-12-22 DIAGNOSIS — R7303 Prediabetes: Secondary | ICD-10-CM | POA: Diagnosis not present

## 2018-12-22 DIAGNOSIS — E782 Mixed hyperlipidemia: Secondary | ICD-10-CM | POA: Diagnosis not present

## 2018-12-22 DIAGNOSIS — Z6833 Body mass index (BMI) 33.0-33.9, adult: Secondary | ICD-10-CM | POA: Diagnosis not present

## 2018-12-22 DIAGNOSIS — N183 Chronic kidney disease, stage 3 (moderate): Secondary | ICD-10-CM | POA: Diagnosis not present

## 2018-12-22 DIAGNOSIS — J449 Chronic obstructive pulmonary disease, unspecified: Secondary | ICD-10-CM | POA: Diagnosis not present

## 2018-12-22 DIAGNOSIS — I129 Hypertensive chronic kidney disease with stage 1 through stage 4 chronic kidney disease, or unspecified chronic kidney disease: Secondary | ICD-10-CM | POA: Diagnosis not present

## 2018-12-22 DIAGNOSIS — Z1159 Encounter for screening for other viral diseases: Secondary | ICD-10-CM | POA: Diagnosis not present

## 2019-01-11 ENCOUNTER — Ambulatory Visit: Payer: Medicare Other | Admitting: Cardiology

## 2019-01-18 DIAGNOSIS — Z03818 Encounter for observation for suspected exposure to other biological agents ruled out: Secondary | ICD-10-CM | POA: Diagnosis not present

## 2019-01-19 DIAGNOSIS — D509 Iron deficiency anemia, unspecified: Secondary | ICD-10-CM | POA: Diagnosis not present

## 2019-01-20 DIAGNOSIS — M25561 Pain in right knee: Secondary | ICD-10-CM | POA: Diagnosis not present

## 2019-01-20 DIAGNOSIS — M170 Bilateral primary osteoarthritis of knee: Secondary | ICD-10-CM | POA: Diagnosis not present

## 2019-01-20 DIAGNOSIS — M25562 Pain in left knee: Secondary | ICD-10-CM | POA: Diagnosis not present

## 2019-01-21 DIAGNOSIS — M1712 Unilateral primary osteoarthritis, left knee: Secondary | ICD-10-CM | POA: Diagnosis not present

## 2019-01-21 DIAGNOSIS — M25562 Pain in left knee: Secondary | ICD-10-CM | POA: Diagnosis not present

## 2019-01-28 DIAGNOSIS — M1711 Unilateral primary osteoarthritis, right knee: Secondary | ICD-10-CM | POA: Diagnosis not present

## 2019-01-28 DIAGNOSIS — M25561 Pain in right knee: Secondary | ICD-10-CM | POA: Diagnosis not present

## 2019-02-03 DIAGNOSIS — M1712 Unilateral primary osteoarthritis, left knee: Secondary | ICD-10-CM | POA: Diagnosis not present

## 2019-02-03 DIAGNOSIS — M25562 Pain in left knee: Secondary | ICD-10-CM | POA: Diagnosis not present

## 2019-02-04 DIAGNOSIS — M1711 Unilateral primary osteoarthritis, right knee: Secondary | ICD-10-CM | POA: Diagnosis not present

## 2019-02-04 DIAGNOSIS — M25561 Pain in right knee: Secondary | ICD-10-CM | POA: Diagnosis not present

## 2019-02-10 DIAGNOSIS — M25562 Pain in left knee: Secondary | ICD-10-CM | POA: Diagnosis not present

## 2019-02-10 DIAGNOSIS — M1712 Unilateral primary osteoarthritis, left knee: Secondary | ICD-10-CM | POA: Diagnosis not present

## 2019-03-27 DIAGNOSIS — K922 Gastrointestinal hemorrhage, unspecified: Secondary | ICD-10-CM | POA: Diagnosis not present

## 2019-03-28 DIAGNOSIS — N823 Fistula of vagina to large intestine: Secondary | ICD-10-CM | POA: Diagnosis not present

## 2019-03-28 DIAGNOSIS — K5289 Other specified noninfective gastroenteritis and colitis: Secondary | ICD-10-CM | POA: Diagnosis not present

## 2019-03-28 DIAGNOSIS — Z7982 Long term (current) use of aspirin: Secondary | ICD-10-CM | POA: Diagnosis not present

## 2019-03-28 DIAGNOSIS — D62 Acute posthemorrhagic anemia: Secondary | ICD-10-CM | POA: Diagnosis not present

## 2019-03-28 DIAGNOSIS — Z86010 Personal history of colonic polyps: Secondary | ICD-10-CM | POA: Diagnosis not present

## 2019-03-28 DIAGNOSIS — I129 Hypertensive chronic kidney disease with stage 1 through stage 4 chronic kidney disease, or unspecified chronic kidney disease: Secondary | ICD-10-CM | POA: Diagnosis not present

## 2019-03-28 DIAGNOSIS — Z9049 Acquired absence of other specified parts of digestive tract: Secondary | ICD-10-CM | POA: Diagnosis not present

## 2019-03-28 DIAGNOSIS — Z8719 Personal history of other diseases of the digestive system: Secondary | ICD-10-CM | POA: Diagnosis not present

## 2019-03-28 DIAGNOSIS — D649 Anemia, unspecified: Secondary | ICD-10-CM | POA: Insufficient documentation

## 2019-03-28 DIAGNOSIS — N182 Chronic kidney disease, stage 2 (mild): Secondary | ICD-10-CM | POA: Diagnosis not present

## 2019-03-28 DIAGNOSIS — R42 Dizziness and giddiness: Secondary | ICD-10-CM | POA: Diagnosis not present

## 2019-03-28 DIAGNOSIS — K5731 Diverticulosis of large intestine without perforation or abscess with bleeding: Secondary | ICD-10-CM | POA: Diagnosis not present

## 2019-03-28 DIAGNOSIS — K625 Hemorrhage of anus and rectum: Secondary | ICD-10-CM | POA: Diagnosis not present

## 2019-03-29 DIAGNOSIS — K5731 Diverticulosis of large intestine without perforation or abscess with bleeding: Secondary | ICD-10-CM | POA: Diagnosis not present

## 2019-03-29 DIAGNOSIS — Z8719 Personal history of other diseases of the digestive system: Secondary | ICD-10-CM | POA: Diagnosis not present

## 2019-03-29 DIAGNOSIS — K625 Hemorrhage of anus and rectum: Secondary | ICD-10-CM | POA: Diagnosis not present

## 2019-03-29 DIAGNOSIS — R42 Dizziness and giddiness: Secondary | ICD-10-CM | POA: Diagnosis not present

## 2019-03-29 DIAGNOSIS — K5289 Other specified noninfective gastroenteritis and colitis: Secondary | ICD-10-CM | POA: Diagnosis not present

## 2019-03-29 DIAGNOSIS — N182 Chronic kidney disease, stage 2 (mild): Secondary | ICD-10-CM | POA: Diagnosis not present

## 2019-03-29 DIAGNOSIS — I129 Hypertensive chronic kidney disease with stage 1 through stage 4 chronic kidney disease, or unspecified chronic kidney disease: Secondary | ICD-10-CM | POA: Diagnosis not present

## 2019-03-29 DIAGNOSIS — Z7982 Long term (current) use of aspirin: Secondary | ICD-10-CM | POA: Diagnosis not present

## 2019-03-29 DIAGNOSIS — K6289 Other specified diseases of anus and rectum: Secondary | ICD-10-CM | POA: Diagnosis not present

## 2019-03-29 DIAGNOSIS — D62 Acute posthemorrhagic anemia: Secondary | ICD-10-CM | POA: Diagnosis not present

## 2019-03-29 DIAGNOSIS — K5790 Diverticulosis of intestine, part unspecified, without perforation or abscess without bleeding: Secondary | ICD-10-CM | POA: Diagnosis not present

## 2019-03-29 DIAGNOSIS — K921 Melena: Secondary | ICD-10-CM | POA: Diagnosis not present

## 2019-03-29 DIAGNOSIS — K5140 Inflammatory polyps of colon without complications: Secondary | ICD-10-CM | POA: Diagnosis not present

## 2019-03-29 DIAGNOSIS — Z980 Intestinal bypass and anastomosis status: Secondary | ICD-10-CM | POA: Diagnosis not present

## 2019-03-29 DIAGNOSIS — Z9889 Other specified postprocedural states: Secondary | ICD-10-CM | POA: Diagnosis not present

## 2019-03-29 DIAGNOSIS — K529 Noninfective gastroenteritis and colitis, unspecified: Secondary | ICD-10-CM | POA: Diagnosis not present

## 2019-03-30 DIAGNOSIS — N182 Chronic kidney disease, stage 2 (mild): Secondary | ICD-10-CM | POA: Diagnosis not present

## 2019-03-30 DIAGNOSIS — D649 Anemia, unspecified: Secondary | ICD-10-CM | POA: Diagnosis not present

## 2019-03-31 DIAGNOSIS — R58 Hemorrhage, not elsewhere classified: Secondary | ICD-10-CM | POA: Diagnosis not present

## 2019-03-31 DIAGNOSIS — Z9049 Acquired absence of other specified parts of digestive tract: Secondary | ICD-10-CM | POA: Diagnosis not present

## 2019-03-31 DIAGNOSIS — D62 Acute posthemorrhagic anemia: Secondary | ICD-10-CM | POA: Diagnosis not present

## 2019-03-31 DIAGNOSIS — Z8719 Personal history of other diseases of the digestive system: Secondary | ICD-10-CM | POA: Diagnosis not present

## 2019-03-31 DIAGNOSIS — R42 Dizziness and giddiness: Secondary | ICD-10-CM | POA: Diagnosis not present

## 2019-03-31 DIAGNOSIS — K625 Hemorrhage of anus and rectum: Secondary | ICD-10-CM | POA: Diagnosis not present

## 2019-04-01 DIAGNOSIS — Z7982 Long term (current) use of aspirin: Secondary | ICD-10-CM | POA: Diagnosis not present

## 2019-04-01 DIAGNOSIS — Z8719 Personal history of other diseases of the digestive system: Secondary | ICD-10-CM | POA: Diagnosis not present

## 2019-04-01 DIAGNOSIS — D500 Iron deficiency anemia secondary to blood loss (chronic): Secondary | ICD-10-CM | POA: Diagnosis not present

## 2019-04-01 DIAGNOSIS — K5289 Other specified noninfective gastroenteritis and colitis: Secondary | ICD-10-CM | POA: Diagnosis present

## 2019-04-01 DIAGNOSIS — K922 Gastrointestinal hemorrhage, unspecified: Secondary | ICD-10-CM | POA: Diagnosis not present

## 2019-04-01 DIAGNOSIS — K921 Melena: Secondary | ICD-10-CM | POA: Diagnosis not present

## 2019-04-01 DIAGNOSIS — K5790 Diverticulosis of intestine, part unspecified, without perforation or abscess without bleeding: Secondary | ICD-10-CM | POA: Diagnosis not present

## 2019-04-01 DIAGNOSIS — D62 Acute posthemorrhagic anemia: Secondary | ICD-10-CM | POA: Diagnosis not present

## 2019-04-01 DIAGNOSIS — K5731 Diverticulosis of large intestine without perforation or abscess with bleeding: Secondary | ICD-10-CM | POA: Diagnosis present

## 2019-04-01 DIAGNOSIS — K625 Hemorrhage of anus and rectum: Secondary | ICD-10-CM | POA: Diagnosis not present

## 2019-04-01 DIAGNOSIS — Z9049 Acquired absence of other specified parts of digestive tract: Secondary | ICD-10-CM | POA: Diagnosis not present

## 2019-04-07 ENCOUNTER — Ambulatory Visit (INDEPENDENT_AMBULATORY_CARE_PROVIDER_SITE_OTHER): Payer: Medicare Other | Admitting: Cardiology

## 2019-04-07 ENCOUNTER — Encounter: Payer: Self-pay | Admitting: Cardiology

## 2019-04-07 ENCOUNTER — Other Ambulatory Visit: Payer: Self-pay

## 2019-04-07 VITALS — BP 124/60 | HR 70 | Ht 65.0 in | Wt 205.8 lb

## 2019-04-07 DIAGNOSIS — I10 Essential (primary) hypertension: Secondary | ICD-10-CM | POA: Diagnosis not present

## 2019-04-07 DIAGNOSIS — I739 Peripheral vascular disease, unspecified: Secondary | ICD-10-CM | POA: Diagnosis not present

## 2019-04-07 DIAGNOSIS — E785 Hyperlipidemia, unspecified: Secondary | ICD-10-CM

## 2019-04-07 DIAGNOSIS — R0789 Other chest pain: Secondary | ICD-10-CM | POA: Diagnosis not present

## 2019-04-07 NOTE — Patient Instructions (Signed)
Medication Instructions:  Your physician recommends that you continue on your current medications as directed. Please refer to the Current Medication list given to you today.  *If you need a refill on your cardiac medications before your next appointment, please call your pharmacy*  Lab Work: None.  If you have labs (blood work) drawn today and your tests are completely normal, you will receive your results only by: Marland Kitchen MyChart Message (if you have MyChart) OR . A paper copy in the mail If you have any lab test that is abnormal or we need to change your treatment, we will call you to review the results.  Testing/Procedures: Your physician has requested that you have a carotid duplex. This test is an ultrasound of the carotid arteries in your neck. It looks at blood flow through these arteries that supply the brain with blood. Allow one hour for this exam. There are no restrictions or special instructions.    Follow-Up: At [facility], you and your health needs are our priority.  As part of our continuing mission to provide you with exceptional heart care, we have created designated Provider Care Teams.  These Care Teams include your primary Cardiologist (physician) and Advanced Practice Providers (APPs -  Physician Assistants and Nurse Practitioners) who all work together to provide you with the care you need, when you need it.  Your next appointment:   3 months  The format for your next appointment:   In Person  Provider:   You will see Dr. Agustin Cree.  Or, you can be scheduled with the following Advanced Practice Provider on your designated Care Team (at our [facility]):  Laurann Montana, FNP    Other Instructions

## 2019-04-07 NOTE — Progress Notes (Signed)
Cardiology Office Note:    Date:  04/07/2019   ID:  Ashley Ritter, DOB [birth year], MRN [id number]  PCP:  Lillard Anes, MD  Cardiologist:  Jenne Campus, MD    Referring MD: Lillard Anes,*   Chief Complaint  Patient presents with  . Follow-up  I have the pain in the left side of my neck  History of Present Illness:    Ashley Ritter is a 78 y.o. female with peripheral vascular disease, carotid bruit, dyslipidemia comes today to my office for follow-up she complained of having pain in the left side of her neck she pinpoint location that I can see pulsating mass look like it is kinked artery.  Pressing in this area give her some pain there is no redness or discharge around this area.  Overall seems to be doing well.  Otherwise she was recently fine to have diverticulosis she is being followed by surgeons.  She also seen GI specialist.  Also recently had colonoscopy done few polyps were identified.  Past Medical History:  Diagnosis Date  . Atypical chest pain   . Carotid bruit   . Hyperlipidemia   . Hypertension   . Vertigo     Past Surgical History:  Procedure Laterality Date  . GASTRIC RESTRICTION SURGERY    . SHOULDER SURGERY    . 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 TUBAL LIGATION      Current Medications: Current Meds  Medication Sig  . ferrous fumarate-iron polysaccharide complex (TANDEM) 162-115.2 MG CAPS capsule Take 1 capsule by mouth daily.  Marland Kitchen triamterene-hydrochlorothiazide (DYAZIDE) 37.5-25 MG capsule Take 1 capsule by mouth daily.  Marland Kitchen VITAMIN D, CHOLECALCIFEROL, PO Take 1 tablet by mouth daily.     Allergies:   Penicillins and Oxycodone   Social History   Socioeconomic History  . Marital status: Married    Spouse name: Not on file  . Number of children: Not on file  .  Years of education: Not on file  . Highest education level: Not on file  Occupational History  . Not on file  Social Needs  . Financial resource strain: Not on file  . Food insecurity    Worry: Not on file    Inability: Not on file  . Transportation needs    Medical: Not on file    Non-medical: Not on file  Tobacco Use  . Smoking status: Former Smoker    Quit date: 1983    Years since quitting: 37.8  . Smokeless tobacco: Never Used  Substance and Sexual Activity  . Alcohol use: No  . Drug use: No  . Sexual activity: Not on file  Lifestyle  . Physical activity    Days per week: Not on file    Minutes per session: Not on file  . Stress: Not on file  Relationships  . Social Herbalist on phone: Not on file    Gets together: Not on file    Attends religious service: Not on file    Active member of club or organization: Not on file    Attends meetings of clubs or organizations: Not on file    Relationship status: Not on file  Other Topics Concern  . Not on file  Social History Narrative  . Not on file     Family History: The patient's  family history includes Diabetes in her mother; Heart attack in her father; Heart disease in her father; Hyperlipidemia in her brother; Hypertension in her brother. ROS:   Please see the history of present illness.    All 14 point review of systems negative except as described per history of present illness  EKGs/Labs/Other Studies Reviewed:      Recent Labs: No results found for requested labs within last 8760 hours.  Recent Lipid Panel    Component Value Date/Time   TRIG 63 11/25/2016 0642    Physical Exam:    VS:  BP 124/60   Pulse 70   Ht 5\' 5"  (1.651 m)   Wt 205 lb 12.8 oz (93.4 kg)   SpO2 98%   BMI 34.25 kg/m     Wt Readings from Last 3 Encounters:  04/07/19 205 lb 12.8 oz (93.4 kg)  01/27/18 195 lb (88.5 kg)  04/23/17 184 lb (83.5 kg)     GEN:  Well nourished, well developed in no acute distress  HEENT: Normal NECK: No JVD; No carotid bruits LYMPHATICS: No lymphadenopathy CARDIAC: RRR, no murmurs, no rubs, no gallops RESPIRATORY:  Clear to auscultation without rales, wheezing or rhonchi  ABDOMEN: Soft, non-tender, non-distended MUSCULOSKELETAL:  No edema; No deformity  SKIN: Warm and dry LOWER EXTREMITIES: no swelling NEUROLOGIC:  Alert and oriented x 3 PSYCHIATRIC:  Normal affect   ASSESSMENT:    1. Peripheral vascular disease (HCC)   2. Essential hypertension   3. Dyslipidemia   4. Atypical chest pain    PLAN:    In order of problems listed above:  1. Peripheral vascular disease has schedule her to have another carotic ultrasound. 2. Essential hypertension blood pressure appears to be well controlled continue present management. 3. Dyslipidemia will call primary care physician to get fasting lipid profile. 4. Atypical chest pain denies having any   Medication Adjustments/Labs and Tests Ordered: Current medicines are reviewed at length with the patient today.  Concerns regarding medicines are outlined above.  No orders of the defined types were placed in this encounter.  Medication changes: No orders of the defined types were placed in this encounter.   Signed, 04/25/17, MD, [facility] 04/07/2019 3:27 PM    [facility]

## 2019-04-07 NOTE — Addendum Note (Signed)
Addended by: Ashok Norris on: 04/07/2019 03:31 PM   Modules accepted: Orders

## 2019-04-27 DIAGNOSIS — K921 Melena: Secondary | ICD-10-CM | POA: Diagnosis not present

## 2019-05-19 DIAGNOSIS — M21162 Varus deformity, not elsewhere classified, left knee: Secondary | ICD-10-CM | POA: Diagnosis not present

## 2019-05-19 DIAGNOSIS — M21161 Varus deformity, not elsewhere classified, right knee: Secondary | ICD-10-CM | POA: Diagnosis not present

## 2019-05-19 DIAGNOSIS — Z79899 Other long term (current) drug therapy: Secondary | ICD-10-CM | POA: Diagnosis not present

## 2019-05-19 DIAGNOSIS — Z0189 Encounter for other specified special examinations: Secondary | ICD-10-CM | POA: Diagnosis not present

## 2019-05-19 DIAGNOSIS — Z789 Other specified health status: Secondary | ICD-10-CM | POA: Diagnosis not present

## 2019-05-19 DIAGNOSIS — M25562 Pain in left knee: Secondary | ICD-10-CM | POA: Diagnosis not present

## 2019-05-19 DIAGNOSIS — R269 Unspecified abnormalities of gait and mobility: Secondary | ICD-10-CM | POA: Diagnosis not present

## 2019-05-19 DIAGNOSIS — M170 Bilateral primary osteoarthritis of knee: Secondary | ICD-10-CM | POA: Diagnosis not present

## 2019-05-19 DIAGNOSIS — M25561 Pain in right knee: Secondary | ICD-10-CM | POA: Diagnosis not present

## 2019-05-19 DIAGNOSIS — Z7689 Persons encountering health services in other specified circumstances: Secondary | ICD-10-CM | POA: Diagnosis not present

## 2019-05-20 DIAGNOSIS — M25562 Pain in left knee: Secondary | ICD-10-CM | POA: Diagnosis not present

## 2019-05-20 DIAGNOSIS — M170 Bilateral primary osteoarthritis of knee: Secondary | ICD-10-CM | POA: Diagnosis not present

## 2019-07-13 ENCOUNTER — Other Ambulatory Visit: Payer: Self-pay

## 2019-07-13 ENCOUNTER — Encounter: Payer: Self-pay | Admitting: Cardiology

## 2019-07-13 ENCOUNTER — Ambulatory Visit (INDEPENDENT_AMBULATORY_CARE_PROVIDER_SITE_OTHER): Payer: Medicare Other | Admitting: Cardiology

## 2019-07-13 VITALS — BP 136/72 | HR 83 | Ht 65.0 in | Wt 209.0 lb

## 2019-07-13 DIAGNOSIS — I739 Peripheral vascular disease, unspecified: Secondary | ICD-10-CM | POA: Diagnosis not present

## 2019-07-13 DIAGNOSIS — E785 Hyperlipidemia, unspecified: Secondary | ICD-10-CM

## 2019-07-13 DIAGNOSIS — I10 Essential (primary) hypertension: Secondary | ICD-10-CM | POA: Diagnosis not present

## 2019-07-13 NOTE — Patient Instructions (Signed)
Medication Instructions:  Your physician recommends that you continue on your current medications as directed. Please refer to the Current Medication list given to you today.  *If you need a refill on your cardiac medications before your next appointment, please call your pharmacy*  Lab Work: Your physician recommends that you return for lab work today: lipid   If you have labs (blood work) drawn today and your tests are completely normal, you will receive your results only by: Marland Kitchen MyChart Message (if you have MyChart) OR . A paper copy in the mail If you have any lab test that is abnormal or we need to change your treatment, we will call you to review the results.  Testing/Procedures: Your physician has requested that you have a carotid duplex. This test is an ultrasound of the carotid arteries in your neck. It looks at blood flow through these arteries that supply the brain with blood. Allow one hour for this exam. There are no restrictions or special instructions.    Follow-Up: At [facility], you and your health needs are our priority.  As part of our continuing mission to provide you with exceptional heart care, we have created designated Provider Care Teams.  These Care Teams include your primary Cardiologist (physician) and Advanced Practice Providers (APPs -  Physician Assistants and Nurse Practitioners) who all work together to provide you with the care you need, when you need it.  Your next appointment:   6 month(s)  The format for your next appointment:   In Person  Provider:   Gypsy Balsam, MD  Other Instructions

## 2019-07-13 NOTE — Progress Notes (Signed)
Cardiology Office Note:    Date:  07/13/2019   ID:  [name], DOB 1940/08/01, MRN [id number]  PCP:  Lillard Anes, MD  Cardiologist:  Jenne Campus, MD    Referring MD: Lillard Anes,*   Chief Complaint  Patient presents with  . Follow-up    3 MO FU     History of Present Illness:    Ashley Ritter is a 79 y.o. female with past medical history significant for peripheral vascular disease, in form of carotic arterial disease, essential hypertension, dyslipidemia.  Comes today to my office for follow-up.  She is doing well apparently recently she had some GI bleed which required some intervention.  But since that time she is doing well.  Past Medical History:  Diagnosis Date  . Atypical chest pain   . Carotid bruit   . Hyperlipidemia   . Hypertension   . Vertigo     Past Surgical History:  Procedure Laterality Date  . GASTRIC RESTRICTION SURGERY    . SHOULDER SURGERY    . 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 TUBAL LIGATION      Current Medications: Current Meds  Medication Sig  . ferrous fumarate-iron polysaccharide complex (TANDEM) 162-115.2 MG CAPS capsule Take 1 capsule by mouth daily.  Marland Kitchen triamterene-hydrochlorothiazide (DYAZIDE) 37.5-25 MG capsule Take 1 capsule by mouth daily.  Marland Kitchen VITAMIN D, CHOLECALCIFEROL, PO Take 1 tablet by mouth daily.     Allergies:   Penicillins and Oxycodone   Social History   Socioeconomic History  . Marital status: Married    Spouse name: Not on file  . Number of children: Not on file  . Years of education: Not on file  . Highest education level: Not on file  Occupational History  . Not on file  Tobacco Use  . Smoking status: Former Smoker    Quit date: 1983    Years since quitting: 38.1  . Smokeless tobacco: Never Used  Substance and  Sexual Activity  . Alcohol use: No  . Drug use: No  . Sexual activity: Not on file  Other Topics Concern  . Not on file  Social History Narrative  . Not on file   Social Determinants of Health   Financial Resource Strain:   . Difficulty of Paying Living Expenses: Not on file  Food Insecurity:   . Worried About Charity fundraiser in the Last Year: Not on file  . Ran Out of Food in the Last Year: Not on file  Transportation Needs:   . Lack of Transportation (Medical): Not on file  . Lack of Transportation (Non-Medical): Not on file  Physical Activity:   . Days of Exercise per Week: Not on file  . Minutes of Exercise per Session: Not on file  Stress:   . Feeling of Stress : Not on file  Social Connections:   . Frequency of Communication with Friends and Family: Not on file  . Frequency of Social Gatherings with Friends and Family: Not on file  . Attends Religious Services: Not on file  . Active Member of Clubs or Organizations: Not on file  . Attends Archivist Meetings: Not on file  . Marital Status: Not on file     Family History: The patient's family history includes Diabetes in her mother; Heart attack in her father; Heart  disease in her father; Hyperlipidemia in her brother; Hypertension in her brother. ROS:   Please see the history of present illness.    All 14 point review of systems negative except as described per history of present illness  EKGs/Labs/Other Studies Reviewed:      Recent Labs: No results found for requested labs within last 8760 hours.  Recent Lipid Panel    Component Value Date/Time   TRIG 63 11/25/2016 0642    Physical Exam:    VS:  BP 136/72   Pulse 83   Ht 5\' 5"  (1.651 m)   Wt 209 lb (94.8 kg)   SpO2 97%   BMI 34.78 kg/m     Wt Readings from Last 3 Encounters:  07/13/19 209 lb (94.8 kg)  04/07/19 205 lb 12.8 oz (93.4 kg)  01/27/18 195 lb (88.5 kg)     GEN:  Well nourished, well developed in no acute  distress HEENT: Normal NECK: No JVD; No carotid bruits LYMPHATICS: No lymphadenopathy CARDIAC: RRR, no murmurs, no rubs, no gallops RESPIRATORY:  Clear to auscultation without rales, wheezing or rhonchi  ABDOMEN: Soft, non-tender, non-distended MUSCULOSKELETAL:  No edema; No deformity  SKIN: Warm and dry LOWER EXTREMITIES: no swelling NEUROLOGIC:  Alert and oriented x 3 PSYCHIATRIC:  Normal affect   ASSESSMENT:    1. Essential hypertension   2. Peripheral vascular disease (HCC)   3. Dyslipidemia    PLAN:    In order of problems listed above:  1. Essential hypertension blood pressure well controlled continue present management. 2. Peripheral vascular disease she did not showed up for cardiac ultrasounds.  I will schedule her again.  She said she does not like cold weather and she prefers to come for her testing about 3 months when the weather will get warmer. 3. Dyslipidemia.  I will schedule her to have fasting lipid profile today.  She does intermittent fasting she usually do not eat anything until about 6:00 PM, therefore we can do the test today   Medication Adjustments/Labs and Tests Ordered: Current medicines are reviewed at length with the patient today.  Concerns regarding medicines are outlined above.  No orders of the defined types were placed in this encounter.  Medication changes: No orders of the defined types were placed in this encounter.   Signed, 01/29/18, MD, [facility] 07/13/2019 3:00 PM    [facility]

## 2019-07-14 LAB — LIPID PANEL
Chol/HDL Ratio: 2.8 ratio (ref 0.0–4.4)
Cholesterol, Total: 227 mg/dL — ABNORMAL HIGH (ref 100–199)
HDL: 80 mg/dL (ref >39)
LDL Chol Calc (NIH): 138 mg/dL — ABNORMAL HIGH (ref 0–99)
Triglycerides: 55 mg/dL (ref 0–149)
VLDL Cholesterol Cal: 9 mg/dL (ref 5–40)

## 2019-08-04 DIAGNOSIS — D509 Iron deficiency anemia, unspecified: Secondary | ICD-10-CM | POA: Diagnosis not present

## 2019-08-18 DIAGNOSIS — D509 Iron deficiency anemia, unspecified: Secondary | ICD-10-CM | POA: Diagnosis not present

## 2019-08-25 DIAGNOSIS — D509 Iron deficiency anemia, unspecified: Secondary | ICD-10-CM | POA: Diagnosis not present

## 2019-08-30 ENCOUNTER — Ambulatory Visit (INDEPENDENT_AMBULATORY_CARE_PROVIDER_SITE_OTHER): Payer: Medicare Other

## 2019-08-30 ENCOUNTER — Other Ambulatory Visit: Payer: Self-pay

## 2019-08-30 DIAGNOSIS — E785 Hyperlipidemia, unspecified: Secondary | ICD-10-CM | POA: Diagnosis not present

## 2019-08-30 DIAGNOSIS — R0989 Other specified symptoms and signs involving the circulatory and respiratory systems: Secondary | ICD-10-CM | POA: Diagnosis not present

## 2019-08-30 DIAGNOSIS — I10 Essential (primary) hypertension: Secondary | ICD-10-CM | POA: Diagnosis not present

## 2019-08-30 NOTE — Progress Notes (Signed)
Carotid artery duplex exam has been performed. Tortious vessels, Difficult exam.  Ashley Ritter, RDCS, RVT

## 2019-09-02 DIAGNOSIS — M25562 Pain in left knee: Secondary | ICD-10-CM | POA: Diagnosis not present

## 2019-09-02 DIAGNOSIS — M1712 Unilateral primary osteoarthritis, left knee: Secondary | ICD-10-CM | POA: Diagnosis not present

## 2019-09-02 DIAGNOSIS — M25561 Pain in right knee: Secondary | ICD-10-CM | POA: Diagnosis not present

## 2019-09-02 DIAGNOSIS — M170 Bilateral primary osteoarthritis of knee: Secondary | ICD-10-CM | POA: Diagnosis not present

## 2019-09-08 DIAGNOSIS — M1711 Unilateral primary osteoarthritis, right knee: Secondary | ICD-10-CM | POA: Diagnosis not present

## 2019-09-08 DIAGNOSIS — M25561 Pain in right knee: Secondary | ICD-10-CM | POA: Diagnosis not present

## 2019-09-09 DIAGNOSIS — M25562 Pain in left knee: Secondary | ICD-10-CM | POA: Diagnosis not present

## 2019-09-09 DIAGNOSIS — M1712 Unilateral primary osteoarthritis, left knee: Secondary | ICD-10-CM | POA: Diagnosis not present

## 2019-09-15 DIAGNOSIS — M25561 Pain in right knee: Secondary | ICD-10-CM | POA: Diagnosis not present

## 2019-09-15 DIAGNOSIS — M1711 Unilateral primary osteoarthritis, right knee: Secondary | ICD-10-CM | POA: Diagnosis not present

## 2019-09-16 DIAGNOSIS — M25562 Pain in left knee: Secondary | ICD-10-CM | POA: Diagnosis not present

## 2019-09-16 DIAGNOSIS — M1712 Unilateral primary osteoarthritis, left knee: Secondary | ICD-10-CM | POA: Diagnosis not present

## 2019-09-22 DIAGNOSIS — M25561 Pain in right knee: Secondary | ICD-10-CM | POA: Diagnosis not present

## 2019-09-22 DIAGNOSIS — M170 Bilateral primary osteoarthritis of knee: Secondary | ICD-10-CM | POA: Diagnosis not present

## 2019-09-23 DIAGNOSIS — M25562 Pain in left knee: Secondary | ICD-10-CM | POA: Diagnosis not present

## 2019-09-23 DIAGNOSIS — M170 Bilateral primary osteoarthritis of knee: Secondary | ICD-10-CM | POA: Diagnosis not present

## 2019-10-06 DIAGNOSIS — M1711 Unilateral primary osteoarthritis, right knee: Secondary | ICD-10-CM | POA: Diagnosis not present

## 2019-10-06 DIAGNOSIS — M25561 Pain in right knee: Secondary | ICD-10-CM | POA: Diagnosis not present

## 2019-10-11 ENCOUNTER — Ambulatory Visit: Payer: Medicare Other | Admitting: Cardiology

## 2019-11-03 DIAGNOSIS — D509 Iron deficiency anemia, unspecified: Secondary | ICD-10-CM | POA: Diagnosis not present

## 2020-01-01 DIAGNOSIS — Z1231 Encounter for screening mammogram for malignant neoplasm of breast: Secondary | ICD-10-CM | POA: Diagnosis not present

## 2020-01-03 ENCOUNTER — Encounter: Payer: Self-pay | Admitting: Legal Medicine

## 2020-01-12 DIAGNOSIS — M1712 Unilateral primary osteoarthritis, left knee: Secondary | ICD-10-CM | POA: Diagnosis not present

## 2020-01-12 DIAGNOSIS — M170 Bilateral primary osteoarthritis of knee: Secondary | ICD-10-CM | POA: Diagnosis not present

## 2020-01-12 DIAGNOSIS — M25562 Pain in left knee: Secondary | ICD-10-CM | POA: Diagnosis not present

## 2020-01-12 DIAGNOSIS — M25561 Pain in right knee: Secondary | ICD-10-CM | POA: Diagnosis not present

## 2020-01-13 DIAGNOSIS — M25561 Pain in right knee: Secondary | ICD-10-CM | POA: Diagnosis not present

## 2020-01-13 DIAGNOSIS — M1711 Unilateral primary osteoarthritis, right knee: Secondary | ICD-10-CM | POA: Diagnosis not present

## 2020-01-19 DIAGNOSIS — M25562 Pain in left knee: Secondary | ICD-10-CM | POA: Diagnosis not present

## 2020-01-19 DIAGNOSIS — M1712 Unilateral primary osteoarthritis, left knee: Secondary | ICD-10-CM | POA: Diagnosis not present

## 2020-01-26 DIAGNOSIS — M25561 Pain in right knee: Secondary | ICD-10-CM | POA: Diagnosis not present

## 2020-01-26 DIAGNOSIS — M1612 Unilateral primary osteoarthritis, left hip: Secondary | ICD-10-CM | POA: Diagnosis not present

## 2020-01-26 DIAGNOSIS — M25852 Other specified joint disorders, left hip: Secondary | ICD-10-CM | POA: Diagnosis not present

## 2020-01-26 DIAGNOSIS — M1711 Unilateral primary osteoarthritis, right knee: Secondary | ICD-10-CM | POA: Diagnosis not present

## 2020-01-26 DIAGNOSIS — M25552 Pain in left hip: Secondary | ICD-10-CM | POA: Diagnosis not present

## 2020-02-01 ENCOUNTER — Ambulatory Visit (INDEPENDENT_AMBULATORY_CARE_PROVIDER_SITE_OTHER): Payer: Medicare Other | Admitting: Cardiology

## 2020-02-01 ENCOUNTER — Other Ambulatory Visit: Payer: Self-pay

## 2020-02-01 ENCOUNTER — Encounter: Payer: Self-pay | Admitting: Cardiology

## 2020-02-01 VITALS — BP 132/56 | HR 58 | Ht 65.0 in | Wt 204.0 lb

## 2020-02-01 DIAGNOSIS — E785 Hyperlipidemia, unspecified: Secondary | ICD-10-CM

## 2020-02-01 DIAGNOSIS — I739 Peripheral vascular disease, unspecified: Secondary | ICD-10-CM

## 2020-02-01 DIAGNOSIS — I10 Essential (primary) hypertension: Secondary | ICD-10-CM

## 2020-02-01 NOTE — Progress Notes (Signed)
Cardiology Office Note:    Date:  02/01/2020   ID:  [address] Ashley Ritter, [name] [birth year], MRN [id number]  PCP:  Abigail Miyamoto, MD  Cardiologist:  Gypsy Balsam, MD    Referring MD: Abigail Miyamoto,*   Chief Complaint  Patient presents with  . Follow-up  I am doing fine but I need hip surgery  History of Present Illness:    Ashley Ritter is a 79 y.o. female with past medical history significant for hypertension which seems to be difficult to control, dyslipidemia, carotic bruit however no significant cardiac arterial disease.  She comes today to my office to follow-up.  Also she is planning to have left knee replacement surgery.  Denies have any cardiac complaints.  No chest pain tightness squeezing pressure burning chest, obviously her exercise capacity is very limited because of hip pain.  Past Medical History:  Diagnosis Date  . Atypical chest pain   . Carotid bruit   . Hyperlipidemia   . Hypertension   . Vertigo     Past Surgical History:  Procedure Laterality Date  . GASTRIC RESTRICTION SURGERY    . SHOULDER SURGERY    . 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 TUBAL LIGATION      Current Medications: Current Meds  Medication Sig  . ferrous fumarate-iron polysaccharide complex (TANDEM) 162-115.2 MG CAPS capsule Take 1 capsule by mouth daily.  Marland Kitchen triamterene-hydrochlorothiazide (DYAZIDE) 37.5-25 MG capsule Take 1 capsule by mouth daily.  Marland Kitchen VITAMIN D, CHOLECALCIFEROL, PO Take 1 tablet by mouth daily.     Allergies:   Penicillins and Oxycodone   Social History   Socioeconomic History  . Marital status: Married    Spouse name: Not on file  . Number of children: Not on file  . Years of education: Not on file  . Highest education level: Not on file  Occupational History  .  Not on file  Tobacco Use  . Smoking status: Former Smoker    Quit date: 1983    Years since quitting: 38.6  . Smokeless tobacco: Never Used  Vaping Use  . Vaping Use: Never used  Substance and Sexual Activity  . Alcohol use: No  . Drug use: No  . Sexual activity: Not on file  Other Topics Concern  . Not on file  Social History Narrative  . Not on file   Social Determinants of Health   Financial Resource Strain:   . Difficulty of Paying Living Expenses: Not on file  Food Insecurity:   . Worried About Programme researcher, broadcasting/film/video in the Last Year: Not on file  . Ran Out of Food in the Last Year: Not on file  Transportation Needs:   . Lack of Transportation (Medical): Not on file  . Lack of Transportation (Non-Medical): Not on file  Physical Activity:   . Days of Exercise per Week: Not on file  . Minutes of Exercise per Session: Not on file  Stress:   . Feeling of Stress : Not on file  Social Connections:   . Frequency of Communication with Friends and Family: Not on file  . Frequency of Social Gatherings with Friends and Family: Not on file  . Attends Religious Services: Not on file  . Active Member of Clubs or Organizations: Not on file  . Attends Banker Meetings: Not  on file  . Marital Status: Not on file     Family History: The patient's family history includes Diabetes in her mother; Heart attack in her father; Heart disease in her father; Hyperlipidemia in her brother; Hypertension in her brother. ROS:   Please see the history of present illness.    All 14 point review of systems negative except as described per history of present illness  EKGs/Labs/Other Studies Reviewed:      Recent Labs: No results found for requested labs within last 8760 hours.  Recent Lipid Panel    Component Value Date/Time   CHOL 227 (H) 07/13/2019 1510   TRIG 55 07/13/2019 1510   HDL 80 07/13/2019 1510   CHOLHDL 2.8 07/13/2019 1510   LDLCALC 138 (H) 07/13/2019 1510     Physical Exam:    VS:  BP (!) 132/56 (BP Location: Right Arm, Patient Position: Sitting, Cuff Size: Large)   Pulse (!) 58   Ht 5\' 5"  (1.651 m)   Wt 204 lb (92.5 kg)   SpO2 96%   BMI 33.95 kg/m     Wt Readings from Last 3 Encounters:  02/01/20 204 lb (92.5 kg)  07/13/19 209 lb (94.8 kg)  04/07/19 205 lb 12.8 oz (93.4 kg)     GEN:  Well nourished, well developed in no acute distress HEENT: Normal NECK: No JVD; No carotid bruits LYMPHATICS: No lymphadenopathy CARDIAC: RRR, no murmurs, no rubs, no gallops RESPIRATORY:  Clear to auscultation without rales, wheezing or rhonchi  ABDOMEN: Soft, non-tender, non-distended MUSCULOSKELETAL:  No edema; No deformity  SKIN: Warm and dry LOWER EXTREMITIES: no swelling NEUROLOGIC:  Alert and oriented x 3 PSYCHIATRIC:  Normal affect   ASSESSMENT:    1. Essential hypertension   2. Dyslipidemia   3. Peripheral vascular disease (HCC)    PLAN:    In order of problems listed above:  1. Essential hypertension finally her blood pressure seems to be reasonable control.  I will continue present management. 2. Dyslipidemia she still gets significant dyslipidemia, she told me straight that she will not take any medication for cholesterol she is very firm about that when asked her about the reason for that she tells me that she read a lot of bad stuff about cholesterol-lowering medication and does not want to take it and she tells me straight that she will not take any medication for cholesterol. 3. Peripheral vascular disease: She did have carotic ultrasounds which did not show any critical stenosis.  We will continue monitoring the situation. 4. Cardiovascular preop evaluation for left hip replacement surgery.  Because of numerous risk factors for coronary artery disease, some nonmodifiable because of the fact that she does not want to take cholesterol medication her poor exercise tolerance I think it would be reasonable to perform Lexiscan  before surgery.  I will ask her to let me know when she have surgery then we will schedule her test accordingly.   Medication Adjustments/Labs and Tests Ordered: Current medicines are reviewed at length with the patient today.  Concerns regarding medicines are outlined above.  No orders of the defined types were placed in this encounter.  Medication changes: No orders of the defined types were placed in this encounter.   Signed, 04/09/19, MD, [facility] 02/01/2020 1:46 PM    [facility]

## 2020-02-01 NOTE — Patient Instructions (Signed)
Medication Instructions:  Your physician recommends that you continue on your current medications as directed. Please refer to the Current Medication list given to you today.  *If you need a refill on your cardiac medications before your next appointment, please call your pharmacy*   Lab Work: None ordered   If you have labs (blood work) drawn today and your tests are completely normal, you will receive your results only by: . MyChart Message (if you have MyChart) OR . A paper copy in the mail If you have any lab test that is abnormal or we need to change your treatment, we will call you to review the results.   Testing/Procedures: None ordered    Follow-Up: At [facility], you and your health needs are our priority.  As part of our continuing mission to provide you with exceptional heart care, we have created designated Provider Care Teams.  These Care Teams include your primary Cardiologist (physician) and Advanced Practice Providers (APPs -  Physician Assistants and Nurse Practitioners) who all work together to provide you with the care you need, when you need it.  We recommend signing up for the patient portal called "MyChart".  Sign up information is provided on this After Visit Summary.  MyChart is used to connect with patients for Virtual Visits (Telemedicine).  Patients are able to view lab/test results, encounter notes, upcoming appointments, etc.  Non-urgent messages can be sent to your provider as well.   To learn more about what you can do with MyChart, go to [email or web address].    Your next appointment:   5 month(s)  The format for your next appointment:   In Person  Provider:   Robert Krasowski, MD   Other Instructions None   

## 2020-02-04 DIAGNOSIS — M1612 Unilateral primary osteoarthritis, left hip: Secondary | ICD-10-CM | POA: Diagnosis not present

## 2020-02-04 DIAGNOSIS — M25552 Pain in left hip: Secondary | ICD-10-CM | POA: Diagnosis not present

## 2020-02-04 DIAGNOSIS — Z96641 Presence of right artificial hip joint: Secondary | ICD-10-CM | POA: Diagnosis not present

## 2020-02-04 DIAGNOSIS — Z471 Aftercare following joint replacement surgery: Secondary | ICD-10-CM | POA: Diagnosis not present

## 2020-03-03 ENCOUNTER — Telehealth: Payer: Self-pay | Admitting: Cardiology

## 2020-03-03 ENCOUNTER — Telehealth: Payer: Self-pay | Admitting: *Deleted

## 2020-03-03 DIAGNOSIS — Z01818 Encounter for other preprocedural examination: Secondary | ICD-10-CM

## 2020-03-03 DIAGNOSIS — R0789 Other chest pain: Secondary | ICD-10-CM

## 2020-03-03 NOTE — Telephone Encounter (Signed)
   [facility] Pre-operative Risk Assessment    Request for surgical clearance:  1. What type of surgery is being performed? Left Total Hip arthroplasty anterior   2. When is this surgery scheduled? 03/29/2020   3. What type of clearance is required (medical clearance vs. Pharmacy clearance to hold med vs. Both)? Both  4. Are there any medications that need to be held prior to surgery and how long?Aspirin  5. Practice name and name of physician performing surgery? [facility], Dr. Pilar Plate Aluisio   6. What is your office phone number [phone]    7.   What is your office fax number [phone] Attn: Glendale Chard  8.   Anesthesia type (None, local, MAC, general) ? Choice   Ashley Ritter 03/03/2020, 4:50 PM  _________________________________________________________________   (provider comments below)

## 2020-03-03 NOTE — Telephone Encounter (Signed)
New message:     Patient calling to get a medical clearance apt the surgery schedule 03/29/20, but the doctor do not have anything. Please call patient.

## 2020-03-06 NOTE — Telephone Encounter (Signed)
My last note stated that she need to have a stress test before procedure, that she have it done?  Should do [facility]

## 2020-03-06 NOTE — Telephone Encounter (Signed)
Seen by Dr. Bing Matter recently for cardiac clearance who recommended a myoview. Please arrange

## 2020-03-07 NOTE — Telephone Encounter (Signed)
She is a moderate risk

## 2020-03-08 ENCOUNTER — Telehealth: Payer: Self-pay | Admitting: Cardiology

## 2020-03-08 NOTE — Telephone Encounter (Addendum)
   Primary Cardiologist: Gypsy Balsam, MD  Chart reviewed as part of pre-operative protocol coverage. Patient was seen in the office recently for pre-operative clearance with recommendation to arrange stress test. As below, this request has been sent. Since patient was seen by a [facility] povider for clearance, that provider will need to route final decisions on clearance once study results and decision has been made. No other separate input needed from preop APP at this time.  I will route this recommendation to the requesting party via Epic fax function and remove from pre-op pool.  Please call with questions.  Laurann Montana, PA-C 03/08/2020, 8:31 AM

## 2020-03-08 NOTE — Telephone Encounter (Signed)
Patient called saying someone from office called her I did not see anything document in her chart.

## 2020-03-08 NOTE — Telephone Encounter (Signed)
Left message for patient to return call.

## 2020-03-09 ENCOUNTER — Telehealth: Payer: Self-pay

## 2020-03-09 NOTE — Telephone Encounter (Signed)
Detailed instructions left on the patient's answering machine. Asked to call back with any questions. S.Wiliams EMTP °

## 2020-03-13 NOTE — Telephone Encounter (Signed)
Left message for patient to return call.

## 2020-03-14 NOTE — Telephone Encounter (Signed)
Called patient. Confirmed her appointment for stress test on Thursday.

## 2020-03-15 ENCOUNTER — Other Ambulatory Visit: Payer: Self-pay

## 2020-03-15 ENCOUNTER — Ambulatory Visit (INDEPENDENT_AMBULATORY_CARE_PROVIDER_SITE_OTHER): Payer: Medicare Other | Admitting: Legal Medicine

## 2020-03-15 ENCOUNTER — Encounter: Payer: Self-pay | Admitting: Legal Medicine

## 2020-03-15 VITALS — BP 130/70 | HR 74 | Temp 97.9°F | Resp 16 | Ht 62.0 in | Wt 205.0 lb

## 2020-03-15 DIAGNOSIS — Z01818 Encounter for other preprocedural examination: Secondary | ICD-10-CM | POA: Diagnosis not present

## 2020-03-15 DIAGNOSIS — I10 Essential (primary) hypertension: Secondary | ICD-10-CM | POA: Diagnosis not present

## 2020-03-15 DIAGNOSIS — M1612 Unilateral primary osteoarthritis, left hip: Secondary | ICD-10-CM | POA: Diagnosis not present

## 2020-03-15 MED ORDER — TRIAMTERENE-HCTZ 37.5-25 MG PO TABS: 1.0000 | ORAL_TABLET | Freq: Every day | ORAL | 3 refills | Status: DC

## 2020-03-15 NOTE — Progress Notes (Signed)
Subjective:  Patient ID: Ashley Ritter, female    DOB: 06/29/1940  Age: 79 y.o. MRN: [id number]  Chief Complaint  Patient presents with  . Hip Pain    Left hip pain    HPI: preop physical for left hip arthroplasty.  Patient has no history of CHF, CAD, No COPD, last eGFR > 60, no strokes or bleedng diathesis.  Patient presents for follow up of hypertension.  Patient tolerating dyazide well with side effects.  Patient was diagnosed with hypertension 2010 so has been treated for hypertension for 10 years.Patient is working on maintaining diet and exercise regimen and follows up as directed. Complication include none  Patient presents with hyperlipidemia.  Compliance with treatment has been good; patient takes medicines as directed, maintains low cholesterol diet, follows up as directed, and maintains exercise regimen.  Patient is using diet without problems..  Patient has severe left OA hip and plans for arthroplasty   Current Outpatient Medications on File Prior to Visit  Medication Sig Dispense Refill  . ferrous fumarate-iron polysaccharide complex (TANDEM) 162-115.2 MG CAPS capsule Take 1 capsule by mouth daily.    . Garlic (GARLIQUE) 720 MG TBEC Take 400 mg by mouth daily.    Marland Kitchen triamterene-hydrochlorothiazide (DYAZIDE) 37.5-25 MG capsule Take 1 capsule by mouth daily.    Marland Kitchen VITAMIN D, CHOLECALCIFEROL, PO Take 1 tablet by mouth daily.     Current Facility-Administered Medications on File Prior to Visit  Medication Dose Route Frequency Provider Last Rate Last Admin  . vancomycin (VANCOCIN) 1,000 mg in sodium chloride 0.9 % 500 mL IVPB  1,000 mg Intravenous Once Dara Lords, Alexzandrew L, PA-C       Past Medical History:  Diagnosis Date  . Atypical chest pain   . Carotid bruit   . Hyperlipidemia   . Hypertension   . Vertigo    Past Surgical History:  Procedure Laterality Date  . GASTRIC RESTRICTION SURGERY    . SHOULDER SURGERY    . TOTAL HIP ARTHROPLASTY Right  04/23/2017   Procedure: RIGHT TOTAL HIP ARTHROPLASTY ANTERIOR APPROACH;  Surgeon: Gaynelle Arabian, MD;  Location: [facility];  Service: Orthopedics;  Laterality: Right;  . TUBAL LIGATION      Family History  Problem Relation Age of Onset  . Diabetes Mother   . Heart attack Father   . Heart disease Father   . Hyperlipidemia Brother   . Hypertension Brother    Social History   Socioeconomic History  . Marital status: Married    Spouse name: Not on file  . Number of children: Not on file  . Years of education: Not on file  . Highest education level: Not on file  Occupational History  . Not on file  Tobacco Use  . Smoking status: Former Smoker    Quit date: 1983    Years since quitting: 38.7  . Smokeless tobacco: Never Used  Vaping Use  . Vaping Use: Never used  Substance and Sexual Activity  . Alcohol use: No  . Drug use: No  . Sexual activity: Not Currently  Other Topics Concern  . Not on file  Social History Narrative  . Not on file   Social Determinants of Health   Financial Resource Strain:   . Difficulty of Paying Living Expenses: Not on file  Food Insecurity:   . Worried About Charity fundraiser in the Last Year: Not on file  . Ran Out of Food in the Last Year: Not on file  Transportation Needs:   . Film/video editor (Medical): Not on file  . Lack of Transportation (Non-Medical): Not on file  Physical Activity:   . Days of Exercise per Week: Not on file  . Minutes of Exercise per Session: Not on file  Stress:   . Feeling of Stress : Not on file  Social Connections:   . Frequency of Communication with Friends and Family: Not on file  . Frequency of Social Gatherings with Friends and Family: Not on file  . Attends Religious Services: Not on file  . Active Member of Clubs or Organizations: Not on file  . Attends Archivist Meetings: Not on file  . Marital Status: Not on file    Review of Systems  Constitutional: Negative.   HENT: Negative.     Respiratory: Negative for cough, choking and shortness of breath.   Cardiovascular: Negative for chest pain, palpitations and leg swelling.  Genitourinary: Negative.   Musculoskeletal: Positive for arthralgias (left hip).  Neurological: Negative.      Objective:  BP 130/70   Pulse 74   Temp 97.9 F (36.6 C)   Resp 16   Ht 5' 2"  (1.575 m)   Wt 205 lb (93 kg)   BMI 37.49 kg/m   BP/Weight 03/15/2020 5/97/4718 10/12/156  Systolic BP 682 574 935  Diastolic BP 70 56 72  Wt. (Lbs) 205 204 209  BMI 37.49 33.95 34.78    Physical Exam Vitals reviewed.  Constitutional:      Appearance: Normal appearance. She is obese.  HENT:     Head: Normocephalic and atraumatic.     Right Ear: Tympanic membrane, ear canal and external ear normal.     Left Ear: Tympanic membrane, ear canal and external ear normal.     Mouth/Throat:     Mouth: Mucous membranes are moist.  Eyes:     Extraocular Movements: Extraocular movements intact.     Conjunctiva/sclera: Conjunctivae normal.     Pupils: Pupils are equal, round, and reactive to light.  Cardiovascular:     Rate and Rhythm: Normal rate and regular rhythm.     Pulses: Normal pulses.     Heart sounds: Normal heart sounds.  Pulmonary:     Effort: Pulmonary effort is normal.     Breath sounds: Normal breath sounds.  Abdominal:     General: Abdomen is flat. Bowel sounds are normal.  Musculoskeletal:        General: Swelling present.     Cervical back: Normal range of motion and neck supple.  Skin:    General: Skin is warm and dry.     Capillary Refill: Capillary refill takes less than 2 seconds.  Neurological:     General: No focal deficit present.     Mental Status: She is alert. Mental status is at baseline.  Psychiatric:        Mood and Affect: Mood normal.        Thought Content: Thought content normal.       Lab Results  Component Value Date   WBC 11.8 (H) 04/26/2017   HGB 8.3 (L) 04/26/2017   HCT 26.5 (L) 04/26/2017   PLT  286 04/26/2017   GLUCOSE 121 (H) 04/25/2017   CHOL 227 (H) 07/13/2019   TRIG 55 07/13/2019   HDL 80 07/13/2019   LDLCALC 138 (H) 07/13/2019   ALT 9 (L) 04/17/2017   AST 14 (L) 04/17/2017   NA 140 04/25/2017   K 4.4 04/25/2017  CL 108 04/25/2017   CREATININE 0.86 04/25/2017   BUN 20 04/25/2017   CO2 29 04/25/2017   INR 0.96 04/17/2017      Assessment & Plan:   1. Osteoarthritis of left hip, unspecified osteoarthritis type Patient has end stage OA left hip and plans THA with Dr, Ricki Rodriguez  2. Pre-operative clearance Patient is cleared for surgery         Follow-up: No follow-ups on file.  An After Visit Summary was printed and given to the patient.  [place] [phone]

## 2020-03-16 ENCOUNTER — Ambulatory Visit (INDEPENDENT_AMBULATORY_CARE_PROVIDER_SITE_OTHER): Payer: Medicare Other

## 2020-03-16 ENCOUNTER — Encounter (HOSPITAL_COMMUNITY): Payer: Self-pay

## 2020-03-16 DIAGNOSIS — R0789 Other chest pain: Secondary | ICD-10-CM | POA: Diagnosis not present

## 2020-03-16 DIAGNOSIS — Z01818 Encounter for other preprocedural examination: Secondary | ICD-10-CM | POA: Diagnosis not present

## 2020-03-16 LAB — MYOCARDIAL PERFUSION IMAGING
LV dias vol: 106 mL (ref 46–106)
LV sys vol: 45 mL
Peak HR: 90 {beats}/min
Rest HR: 48 {beats}/min
SDS: 2
SRS: 4
SSS: 6
TID: 0.99

## 2020-03-16 MED ORDER — TECHNETIUM TC 99M TETROFOSMIN IV KIT
11.0000 | PACK | Freq: Once | INTRAVENOUS | Status: AC | PRN
  Administered 2020-03-16: 11 via INTRAVENOUS

## 2020-03-16 MED ORDER — REGADENOSON 0.4 MG/5ML IV SOLN
0.4000 mg | Freq: Once | INTRAVENOUS | Status: AC
Start: 2020-03-16 — End: 2020-03-16
  Administered 2020-03-16: 0.4 mg via INTRAVENOUS

## 2020-03-16 MED ORDER — TECHNETIUM TC 99M TETROFOSMIN IV KIT
31.6000 | PACK | Freq: Once | INTRAVENOUS | Status: AC | PRN
  Administered 2020-03-16: 31.6 via INTRAVENOUS

## 2020-03-16 NOTE — Progress Notes (Addendum)
COVID Vaccine Completed: Date COVID Vaccine completed: COVID vaccine manufacturer: Pfizer    Moderna   [employer]   PCP - Dr. Brigitte Pulse last office visit note and pre op clearance note 03/15/20 in epic and in chart Cardiologist - Dr. Kandyce Rud last office visit 02/01/20 in epic  Chest x-ray -  EKG - 07/13/19 in epic Stress Test - greater than 2years ECHO - greater than 2years Cardiac Cath -  Pacemaker/ICD device last checked:  Sleep Study -  CPAP -   Fasting Blood Sugar -  Checks Blood Sugar _____ times a day  Blood Thinner Instructions: Aspirin Instructions: is she on ASA Last Dose:  Anesthesia review:   Patient denies shortness of breath, fever, cough and chest pain at PAT appointment   Patient verbalized understanding of instructions that were given to them at the PAT appointment. Patient was also instructed that they will need to review over the PAT instructions again at home before surgery.

## 2020-03-16 NOTE — Patient Instructions (Addendum)
DUE TO COVID-19 ONLY ONE VISITOR IS ALLOWED TO COME WITH YOU AND STAY IN THE WAITING ROOM ONLY DURING PRE OP AND PROCEDURE.   IF YOU WILL BE ADMITTED INTO THE HOSPITAL YOU ARE ALLOWED ONE SUPPORT PERSON DURING VISITATION HOURS ONLY (10AM -8PM)   . The support person may change daily. . The support person must pass our screening, gel in and out, and wear a mask at all times, including in the patient's room. . Patients must also wear a mask when staff or their support person are in the room.   COVID SWAB TESTING MUST BE COMPLETED ON: Friday, Oct. 15, 2021 at 12:20pm   [address] [place], [place] [address]  (Must self quarantine after testing. Follow instructions on handout.)       Your procedure is scheduled on: Wednesday, Oct. 20, 2021   Report to [facility] Main  Entrance    Report to admitting at 11:00 AM   Call this number if you have problems the morning of surgery [phone]   Do not eat food :After Midnight.   May have liquids until  10:30 AM  day of surgery  CLEAR LIQUID DIET  Foods Allowed                                                                     Foods Excluded  Water, Black Coffee and tea, regular and decaf                             liquids that you cannot  Plain Jell-O in any flavor  (No red)                                           see through such as: Fruit ices (not with fruit pulp)                                     milk, soups, orange juice              Iced Popsicles (No red)                                    All solid food                                   Apple juices Sports drinks like Gatorade (No red) Lightly seasoned clear broth or consume(fat free) Sugar, honey syrup  Sample Menu Breakfast                                Lunch                                     Supper Cranberry juice  Beef broth                            Chicken broth Jell-O                                     Grape juice                            Apple juice Coffee or tea                        Jell-O                                      Popsicle                                                Coffee or tea                        Coffee or tea      Complete one Ensure drink the morning of surgery at  10:30AM     the day of surgery.   Oral Hygiene is also important to reduce your risk of infection.                                    Remember - BRUSH YOUR TEETH THE MORNING OF SURGERY WITH YOUR REGULAR TOOTHPASTE      Take these medicines the morning of surgery with A SIP OF WATER: None                               You may not have any metal on your body including hair pins, jewelry, and body piercings             Do not wear make-up, lotions, powders, perfumes/cologne, or deodorant             Do not wear nail polish.  Do not shave  48 hours prior to surgery.                Do not bring valuables to the hospital. [facility] IS NOT             RESPONSIBLE   FOR VALUABLES.   Contacts, dentures or bridgework may not be worn into surgery.   Bring small overnight bag day of surgery.    Special Instructions: Bring a copy of your healthcare power of attorney and living will documents         the day of surgery if you haven't scanned them in before.              Please read over the following fact sheets you were given: IF YOU HAVE QUESTIONS ABOUT YOUR PRE OP INSTRUCTIONS PLEASE CALL [phone]   [facility] - Preparing for Surgery Before surgery, you can play an important role.  Because skin is not sterile, your skin needs to be  as free of germs as possible.  You can reduce the number of germs on your skin by washing with CHG (chlorahexidine gluconate) soap before surgery.  CHG is an antiseptic cleaner which kills germs and bonds with the skin to continue killing germs even after washing. Please DO NOT use if you have an allergy to CHG or antibacterial soaps.  If your skin becomes reddened/irritated stop using the CHG and  inform your nurse when you arrive at Short Stay. Do not shave (including legs and underarms) for at least 48 hours prior to the first CHG shower.  You may shave your face/neck.  Please follow these instructions carefully:  1.  Shower with CHG Soap the night before surgery and the  morning of surgery.  2.  If you choose to wash your hair, wash your hair first as usual with your normal  shampoo.  3.  After you shampoo, rinse your hair and body thoroughly to remove the shampoo.                             4.  Use CHG as you would any other liquid soap.  You can apply chg directly to the skin and wash.  Gently with a scrungie or clean washcloth.  5.  Apply the CHG Soap to your body ONLY FROM THE NECK DOWN.   Do   not use on face/ open                           Wound or open sores. Avoid contact with eyes, ears mouth and   genitals (private parts).                       Wash face,  Genitals (private parts) with your normal soap.             6.  Wash thoroughly, paying special attention to the area where your    surgery  will be performed.  7.  Thoroughly rinse your body with warm water from the neck down.  8.  DO NOT shower/wash with your normal soap after using and rinsing off the CHG Soap.                9.  Pat yourself dry with a clean towel.            10.  Wear clean pajamas.            11.  Place clean sheets on your bed the night of your first shower and do not  sleep with pets. Day of Surgery : Do not apply any lotions/deodorants the morning of surgery.  Please wear clean clothes to the hospital/surgery center.  FAILURE TO FOLLOW THESE INSTRUCTIONS MAY RESULT IN THE CANCELLATION OF YOUR SURGERY  PATIENT SIGNATURE_________________________________  NURSE SIGNATURE__________________________________  ________________________________________________________________________   Rogelia Mire  An incentive spirometer is a tool that can help keep your lungs clear and active. This tool  measures how well you are filling your lungs with each breath. Taking long deep breaths may help reverse or decrease the chance of developing breathing (pulmonary) problems (especially infection) following:  A long period of time when you are unable to move or be active. BEFORE THE PROCEDURE   If the spirometer includes an indicator to show your best effort, your nurse or respiratory therapist will set it to a desired goal.  If possible, sit up straight or lean slightly forward. Try not to slouch.  Hold the incentive spirometer in an upright position. INSTRUCTIONS FOR USE  1. Sit on the edge of your bed if possible, or sit up as far as you can in bed or on a chair. 2. Hold the incentive spirometer in an upright position. 3. Breathe out normally. 4. Place the mouthpiece in your mouth and seal your lips tightly around it. 5. Breathe in slowly and as deeply as possible, raising the piston or the ball toward the top of the column. 6. Hold your breath for 3-5 seconds or for as long as possible. Allow the piston or ball to fall to the bottom of the column. 7. Remove the mouthpiece from your mouth and breathe out normally. 8. Rest for a few seconds and repeat Steps 1 through 7 at least 10 times every 1-2 hours when you are awake. Take your time and take a few normal breaths between deep breaths. 9. The spirometer may include an indicator to show your best effort. Use the indicator as a goal to work toward during each repetition. 10. After each set of 10 deep breaths, practice coughing to be sure your lungs are clear. If you have an incision (the cut made at the time of surgery), support your incision when coughing by placing a pillow or rolled up towels firmly against it. Once you are able to get out of bed, walk around indoors and cough well. You may stop using the incentive spirometer when instructed by your caregiver.  RISKS AND COMPLICATIONS  Take your time so you do not get dizzy or  light-headed.  If you are in pain, you may need to take or ask for pain medication before doing incentive spirometry. It is harder to take a deep breath if you are having pain. AFTER USE  Rest and breathe slowly and easily.  It can be helpful to keep track of a log of your progress. Your caregiver can provide you with a simple table to help with this. If you are using the spirometer at home, follow these instructions: SEEK MEDICAL CARE IF:   You are having difficultly using the spirometer.  You have trouble using the spirometer as often as instructed.  Your pain medication is not giving enough relief while using the spirometer.  You develop fever of 100.5 F (38.1 C) or higher. SEEK IMMEDIATE MEDICAL CARE IF:   You cough up bloody sputum that had not been present before.  You develop fever of 102 F (38.9 C) or greater.  You develop worsening pain at or near the incision site. MAKE SURE YOU:   Understand these instructions.  Will watch your condition.  Will get help right away if you are not doing well or get worse. Document Released: 10/07/2006 Document Revised: 08/19/2011 Document Reviewed: 12/08/2006 Exit[employer] Patient Information 2014 ExitCare, [place].   ________________________________________________________________________  WHAT IS A BLOOD TRANSFUSION? Blood Transfusion Information  A transfusion is the replacement of blood or some of its parts. Blood is made up of multiple cells which provide different functions.  Red blood cells carry oxygen and are used for blood loss replacement.  White blood cells fight against infection.  Platelets control bleeding.  Plasma helps clot blood.  Other blood products are available for specialized needs, such as hemophilia or other clotting disorders. BEFORE THE TRANSFUSION  Who gives blood for transfusions?   Healthy volunteers who are fully evaluated to make sure their blood is safe.  This is blood bank  blood. Transfusion therapy is the safest it has ever been in the practice of medicine. Before blood is taken from a donor, a complete history is taken to make sure that person has no history of diseases nor engages in risky social behavior (examples are intravenous drug use or sexual activity with multiple partners). The donor's travel history is screened to minimize risk of transmitting infections, such as malaria. The donated blood is tested for signs of infectious diseases, such as HIV and hepatitis. The blood is then tested to be sure it is compatible with you in order to minimize the chance of a transfusion reaction. If you or a relative donates blood, this is often done in anticipation of surgery and is not appropriate for emergency situations. It takes many days to process the donated blood. RISKS AND COMPLICATIONS Although transfusion therapy is very safe and saves many lives, the main dangers of transfusion include:   Getting an infectious disease.  Developing a transfusion reaction. This is an allergic reaction to something in the blood you were given. Every precaution is taken to prevent this. The decision to have a blood transfusion has been considered carefully by your caregiver before blood is given. Blood is not given unless the benefits outweigh the risks. AFTER THE TRANSFUSION  Right after receiving a blood transfusion, you will usually feel much better and more energetic. This is especially true if your red blood cells have gotten low (anemic). The transfusion raises the level of the red blood cells which carry oxygen, and this usually causes an energy increase.  The nurse administering the transfusion will monitor you carefully for complications. HOME CARE INSTRUCTIONS  No special instructions are needed after a transfusion. You may find your energy is better. Speak with your caregiver about any limitations on activity for underlying diseases you may have. SEEK MEDICAL CARE IF:    Your condition is not improving after your transfusion.  You develop redness or irritation at the intravenous (IV) site. SEEK IMMEDIATE MEDICAL CARE IF:  Any of the following symptoms occur over the next 12 hours:  Shaking chills.  You have a temperature by mouth above 102 F (38.9 C), not controlled by medicine.  Chest, back, or muscle pain.  People around you feel you are not acting correctly or are confused.  Shortness of breath or difficulty breathing.  Dizziness and fainting.  You get a rash or develop hives.  You have a decrease in urine output.  Your urine turns a dark color or changes to pink, red, or brown. Any of the following symptoms occur over the next 10 days:  You have a temperature by mouth above 102 F (38.9 C), not controlled by medicine.  Shortness of breath.  Weakness after normal activity.  The white part of the eye turns yellow (jaundice).  You have a decrease in the amount of urine or are urinating less often.  Your urine turns a dark color or changes to pink, red, or brown. Document Released: 05/24/2000 Document Revised: 08/19/2011 Document Reviewed: 01/11/2008 [facility] Patient Information 2014 [place], [place].  _______________________________________________________________________

## 2020-03-20 NOTE — Telephone Encounter (Signed)
   Primary Cardiologist: Gypsy Balsam, MD  Chart reviewed as part of pre-operative protocol coverage. Given past medical history and time since last visit, based on ACC/AHA guidelines, Ashley Ritter would be at acceptable risk for the planned procedure without further cardiovascular testing.   I will route this recommendation to the requesting party via Epic fax function and remove from pre-op pool.  Please call with questions.  Thomasene Ripple. Yenesis Even NP-C    03/20/2020, 10:01 AM [facility] [address] Office [phone] Fax [phone]

## 2020-03-21 ENCOUNTER — Encounter (HOSPITAL_COMMUNITY)
Admission: RE | Admit: 2020-03-21 | Discharge: 2020-03-21 | Disposition: A | Discharge status: Home / Self Care | Payer: Medicare Other | Source: Ambulatory Visit | Attending: Orthopedic Surgery | Admitting: Orthopedic Surgery

## 2020-03-21 ENCOUNTER — Encounter (HOSPITAL_COMMUNITY): Payer: Self-pay

## 2020-03-21 ENCOUNTER — Other Ambulatory Visit: Payer: Self-pay

## 2020-03-21 DIAGNOSIS — Z01812 Encounter for preprocedural laboratory examination: Secondary | ICD-10-CM | POA: Diagnosis not present

## 2020-03-21 HISTORY — DX: Personal history of other medical treatment: Z92.89

## 2020-03-21 HISTORY — DX: Iron deficiency anemia, unspecified: D50.9

## 2020-03-21 LAB — CBC
HCT: 36.1 % (ref 36.0–46.0)
Hemoglobin: 11.3 g/dL — ABNORMAL LOW (ref 12.0–15.0)
MCH: 28.1 pg (ref 26.0–34.0)
MCHC: 31.3 g/dL (ref 30.0–36.0)
MCV: 89.8 fL (ref 80.0–100.0)
Platelets: 310 10*3/uL (ref 150–400)
RBC: 4.02 MIL/uL (ref 3.87–5.11)
RDW: 13.2 % (ref 11.5–15.5)
WBC: 7.8 10*3/uL (ref 4.0–10.5)
nRBC: 0 % (ref 0.0–0.2)

## 2020-03-21 LAB — COMPREHENSIVE METABOLIC PANEL
ALT: 10 U/L (ref 0–44)
AST: 15 U/L (ref 15–41)
Albumin: 4.2 g/dL (ref 3.5–5.0)
Alkaline Phosphatase: 61 U/L (ref 38–126)
Anion gap: 8 (ref 5–15)
BUN: 15 mg/dL (ref 8–23)
CO2: 28 mmol/L (ref 22–32)
Calcium: 9.3 mg/dL (ref 8.9–10.3)
Chloride: 104 mmol/L (ref 98–111)
Creatinine, Ser: 0.95 mg/dL (ref 0.44–1.00)
GFR, Estimated: 57 mL/min — ABNORMAL LOW (ref >60)
Glucose, Bld: 109 mg/dL — ABNORMAL HIGH (ref 70–99)
Potassium: 3.5 mmol/L (ref 3.5–5.1)
Sodium: 140 mmol/L (ref 135–145)
Total Bilirubin: 0.6 mg/dL (ref 0.3–1.2)
Total Protein: 7.6 g/dL (ref 6.5–8.1)

## 2020-03-21 LAB — PROTIME-INR
INR: 1 (ref 0.8–1.2)
Prothrombin Time: 12.4 seconds (ref 11.4–15.2)

## 2020-03-21 LAB — SURGICAL PCR SCREEN
MRSA, PCR: NEGATIVE
Staphylococcus aureus: NEGATIVE

## 2020-03-21 LAB — APTT: aPTT: 32 seconds (ref 24–36)

## 2020-03-23 ENCOUNTER — Ambulatory Visit (INDEPENDENT_AMBULATORY_CARE_PROVIDER_SITE_OTHER): Payer: Medicare Other

## 2020-03-23 ENCOUNTER — Other Ambulatory Visit: Payer: Self-pay

## 2020-03-23 DIAGNOSIS — Z23 Encounter for immunization: Secondary | ICD-10-CM

## 2020-03-23 NOTE — Progress Notes (Signed)
The covid test has been rescheduled for Sat 03/25/20 at 12:05pm. We are at the testing site of [address] form 9:00am-12:30PM.

## 2020-03-23 NOTE — Progress Notes (Signed)
   Covid-19 Vaccination Clinic  Name:  Ashley Ritter    MRN: [id number] DOB: 20-Oct-1940  03/23/2020  Ashley Ritter was observed post Covid-19 immunization for 15 minutes without incident. She was provided with Vaccine Information Sheet and instruction to access the V-Safe system.   Ashley Ritter was instructed to call 911 with any severe reactions post vaccine: Marland Kitchen Difficulty breathing  . Swelling of face and throat  . A fast heartbeat  . A bad rash all over body  . Dizziness and weakness

## 2020-03-23 NOTE — Progress Notes (Addendum)
Attempted to contact the patient to reschedule her covid test on Sat 10/16, as she was scheduled one day too early. The mailbox was full. This appointment eill be moved. If anyone has any questions regarding what days and times to schedule covid test please call [phone].

## 2020-03-24 ENCOUNTER — Inpatient Hospital Stay (HOSPITAL_COMMUNITY)
Admission: RE | Admit: 2020-03-24 | Discharge: 2020-03-24 | Disposition: A | Discharge status: Home / Self Care | Payer: Medicare Other | Source: Ambulatory Visit

## 2020-03-25 ENCOUNTER — Other Ambulatory Visit (HOSPITAL_COMMUNITY)
Admission: RE | Admit: 2020-03-25 | Discharge: 2020-03-25 | Disposition: A | Discharge status: Home / Self Care | Payer: Medicare Other | Source: Ambulatory Visit | Attending: Orthopedic Surgery | Admitting: Orthopedic Surgery

## 2020-03-25 DIAGNOSIS — Z20822 Contact with and (suspected) exposure to covid-19: Secondary | ICD-10-CM | POA: Diagnosis not present

## 2020-03-25 DIAGNOSIS — Z01818 Encounter for other preprocedural examination: Secondary | ICD-10-CM | POA: Diagnosis not present

## 2020-03-25 LAB — SARS CORONAVIRUS 2 (TAT 6-24 HRS): SARS Coronavirus 2: NEGATIVE

## 2020-03-28 NOTE — H&P (Signed)
TOTAL HIP ADMISSION H&P  Patient is admitted for left total hip arthroplasty.  Subjective:  Chief Complaint: Left hip pain  HPI: Ashley Ritter, 79 y.o. female, has a history of pain and functional disability in the left hip due to arthritis and patient has failed non-surgical conservative treatments for greater than 12 weeks to include NSAID's and/or analgesics and activity modification. Onset of symptoms was gradual, starting 8 years ago with gradually worsening course since that time. The patient noted no past surgery on the left hip. Patient currently rates pain in the left hip at 10 out of 10 with activity. Patient has worsening of pain with activity and weight bearing and crepitus. Patient has evidence of severe bone-on-bone arthritis with subchondral cysts throughout the acetabulum and femoral head, as well as osteophyte formation. The joint itself is barely visible by imaging studies. This condition presents safety issues increasing the risk of falls. There is no current active infection.  Patient Active Problem List   Diagnosis Date Noted  . OA (osteoarthritis) of hip 04/23/2017  . Peripheral vascular disease (HCC) 04/09/2017  . AKI (acute kidney injury) (HCC) 11/03/2016  . H/O vertigo 11/03/2016  . Hypotension 11/03/2016  . Syncope 11/03/2016  . Atypical chest pain 12/27/2014  . Dyslipidemia 12/27/2014  . Essential hypertension 12/27/2014    Past Medical History:  Diagnosis Date  . Atypical chest pain   . Carotid bruit   . History of blood transfusion   . Hyperlipidemia   . Hypertension   . Iron deficiency anemia   . Vertigo     Past Surgical History:  Procedure Laterality Date  . ABDOMINAL HYSTERECTOMY    . GASTRIC RESTRICTION SURGERY    . SHOULDER SURGERY    . TONSILLECTOMY    . TOTAL HIP ARTHROPLASTY Right 04/23/2017   Procedure: RIGHT TOTAL HIP ARTHROPLASTY ANTERIOR APPROACH;  Surgeon: Ollen Gross, MD;  Location: [facility] ORS;  Service: Orthopedics;   Laterality: Right;  . TUBAL LIGATION      Prior to Admission medications   Medication Sig Start Date End Date Taking? Authorizing Provider  ferrous fumarate-iron polysaccharide complex (TANDEM) 162-115.2 MG CAPS capsule Take 1 capsule by mouth daily. 09/27/16  Yes [provider]  Garlic (GARLIQUE) 400 MG TBEC Take 400 mg by mouth daily.   Yes [provider]  VITAMIN D, CHOLECALCIFEROL, PO Take 1 tablet by mouth daily.   Yes [provider]  aspirin EC 81 MG tablet Take 81 mg by mouth daily. Swallow whole.    [provider]  triamterene-hydrochlorothiazide (MAXZIDE-25) 37.5-25 MG tablet Take 1 tablet by mouth daily. 03/15/20   Abigail Miyamoto, MD    Allergies  Allergen Reactions  . Penicillins Itching    Tolerates ertapenem  20 years ago  . Oxycodone Itching    Social History   Socioeconomic History  . Marital status: Married    Spouse name: Not on file  . Number of children: Not on file  . Years of education: Not on file  . Highest education level: Not on file  Occupational History  . Not on file  Tobacco Use  . Smoking status: Former Smoker    Quit date: 1983    Years since quitting: 38.8  . Smokeless tobacco: Never Used  Vaping Use  . Vaping Use: Never used  Substance and Sexual Activity  . Alcohol use: No  . Drug use: No  . Sexual activity: Not Currently  Other Topics Concern  . Not on file  Social History Narrative  . Not on file   Social Determinants of Health   Financial Resource Strain:   . Difficulty of Paying Living Expenses: Not on file  Food Insecurity:   . Worried About Programme researcher, broadcasting/film/video in the Last Year: Not on file  . Ran Out of Food in the Last Year: Not on file  Transportation Needs:   . Lack of Transportation (Medical): Not on file  . Lack of Transportation (Non-Medical): Not on file  Physical Activity:   . Days of Exercise per Week: Not on file  . Minutes of Exercise per Session: Not on file    Stress:   . Feeling of Stress : Not on file  Social Connections:   . Frequency of Communication with Friends and Family: Not on file  . Frequency of Social Gatherings with Friends and Family: Not on file  . Attends Religious Services: Not on file  . Active Member of Clubs or Organizations: Not on file  . Attends Banker Meetings: Not on file  . Marital Status: Not on file  Intimate Partner Violence:   . Fear of Current or Ex-Partner: Not on file  . Emotionally Abused: Not on file  . Physically Abused: Not on file  . Sexually Abused: Not on file      Tobacco Use: Medium Risk  . Smoking Tobacco Use: Former Smoker  . Smokeless Tobacco Use: Never Used   Social History   Substance and Sexual Activity  Alcohol Use No    Family History  Problem Relation Age of Onset  . Diabetes Mother   . Heart attack Father   . Heart disease Father   . Hyperlipidemia Brother   . Hypertension Brother     Review of Systems  Constitutional: Negative for chills and fever.  HENT: Negative for congestion, sore throat and tinnitus.   Eyes: Negative for double vision, photophobia and pain.  Respiratory: Negative for cough, shortness of breath and wheezing.   Cardiovascular: Negative for chest pain, palpitations and orthopnea.  Gastrointestinal: Negative for heartburn, nausea and vomiting.  Genitourinary: Negative for dysuria, frequency and urgency.  Musculoskeletal: Positive for joint pain.  Neurological: Negative for dizziness, weakness and headaches.     Objective:  Physical Exam: Well nourished and well developed.  General: Alert and oriented x3, cooperative and pleasant, no acute distress.  Head: normocephalic, atraumatic, neck supple.  Eyes: EOMI.  Respiratory: breath sounds clear in all fields, no wheezing, rales, or rhonchi. Cardiovascular: Regular rate and rhythm, no murmurs, gallops or rubs.  Abdomen: non-tender to palpation and soft, normoactive bowel  sounds. Musculoskeletal:  Left Hip Exam:  The range of motion: Flexion to 90 degrees, Internal Rotation to 0 degrees, External Rotation to 10 or 20 degrees, and abduction to 20 degrees without discomfort.  There is no tenderness over the greater trochanter bursa.   Calves soft and nontender. Motor function intact in LE. Strength 5/5 LE bilaterally. Neuro: Distal pulses 2+. Sensation to light touch intact in LE.  Imaging Review Plain radiographs demonstrate severe degenerative joint disease of the left hip. The bone quality appears to be adequate for age and reported activity level.  Assessment/Plan:  End stage arthritis, left hip  The patient history, physical examination, clinical judgement of the provider and imaging studies are consistent with end stage degenerative joint disease of the left hip and total hip arthroplasty is deemed medically necessary. The treatment options including medical management, injection therapy, arthroscopy and arthroplasty were discussed at  length. The risks and benefits of total hip arthroplasty were presented and reviewed. The risks due to aseptic loosening, infection, stiffness, dislocation/subluxation, thromboembolic complications and other imponderables were discussed. The patient acknowledged the explanation, agreed to proceed with the plan and consent was signed. Patient is being admitted for inpatient treatment for surgery, pain control, PT, OT, prophylactic antibiotics, VTE prophylaxis, progressive ambulation and ADLs and discharge planning.The patient is planning to be discharged home.   Patient's anticipated LOS is less than 2 midnights, meeting these requirements: - Younger than 22 - Lives within 1 hour of care - Has a competent adult at home to recover with post-op recover - NO history of  - Chronic pain requiring opiods  - Diabetes  - Coronary Artery Disease  - Heart failure  - Heart attack  - Stroke  - DVT/VTE  - Cardiac arrhythmia  -  Respiratory Failure/COPD  - Renal failure  - Anemia  - Advanced Liver disease  Therapy Plans: HEP Disposition: Home with daughter Planned DVT Prophylaxis: Xarelto 10 mg QD DME Needed: None PCP: Brent Bulla, MD (appt next week) Cardiologist: Gypsy Balsam, MD  TXA: Topical (hx DVT) Allergies: PCN (anaphylaxis), oxycodone (unsure) Anesthesia Concerns: None BMI: 38.5 Last HgbA1c: Not diabetic.  Pharmacy: [facility] on [address]  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 - Follow-up visit in 2 weeks with Dr. Lequita Halt - Begin physical therapy following surgery - Pre-operative lab work as pre-surgical testing - Prescriptions will be provided in hospital at time of discharge  Arther Abbott, PA-C Orthopedic Surgery [facility] [place]

## 2020-03-29 ENCOUNTER — Ambulatory Visit (HOSPITAL_COMMUNITY): Payer: Medicare Other | Admitting: Anesthesiology

## 2020-03-29 ENCOUNTER — Observation Stay (HOSPITAL_COMMUNITY): Payer: Medicare Other

## 2020-03-29 ENCOUNTER — Encounter (HOSPITAL_COMMUNITY): Payer: Self-pay | Admitting: Orthopedic Surgery

## 2020-03-29 ENCOUNTER — Other Ambulatory Visit: Payer: Self-pay

## 2020-03-29 ENCOUNTER — Ambulatory Visit (HOSPITAL_COMMUNITY): Payer: Medicare Other

## 2020-03-29 ENCOUNTER — Telehealth (HOSPITAL_COMMUNITY): Payer: Self-pay | Admitting: *Deleted

## 2020-03-29 ENCOUNTER — Encounter (HOSPITAL_COMMUNITY)
Admission: RE | Disposition: A | Discharge status: Home Health | Payer: Self-pay | Source: Home / Self Care | Attending: Orthopedic Surgery

## 2020-03-29 ENCOUNTER — Inpatient Hospital Stay (HOSPITAL_COMMUNITY)
Admission: RE | Admit: 2020-03-29 | Discharge: 2020-04-03 | DRG: 470 | Disposition: A | Discharge status: Home Health | Payer: Medicare Other | Attending: Orthopedic Surgery | Admitting: Orthopedic Surgery

## 2020-03-29 ENCOUNTER — Ambulatory Visit (HOSPITAL_COMMUNITY): Payer: Medicare Other | Admitting: Physician Assistant

## 2020-03-29 DIAGNOSIS — I10 Essential (primary) hypertension: Secondary | ICD-10-CM | POA: Diagnosis not present

## 2020-03-29 DIAGNOSIS — Z885 Allergy status to narcotic agent status: Secondary | ICD-10-CM | POA: Diagnosis not present

## 2020-03-29 DIAGNOSIS — Z7982 Long term (current) use of aspirin: Secondary | ICD-10-CM | POA: Diagnosis not present

## 2020-03-29 DIAGNOSIS — Z833 Family history of diabetes mellitus: Secondary | ICD-10-CM

## 2020-03-29 DIAGNOSIS — Z79899 Other long term (current) drug therapy: Secondary | ICD-10-CM | POA: Diagnosis not present

## 2020-03-29 DIAGNOSIS — Z880 Allergy status to penicillin: Secondary | ICD-10-CM

## 2020-03-29 DIAGNOSIS — Z87891 Personal history of nicotine dependence: Secondary | ICD-10-CM | POA: Diagnosis not present

## 2020-03-29 DIAGNOSIS — Z90710 Acquired absence of both cervix and uterus: Secondary | ICD-10-CM

## 2020-03-29 DIAGNOSIS — M1612 Unilateral primary osteoarthritis, left hip: Principal | ICD-10-CM | POA: Diagnosis present

## 2020-03-29 DIAGNOSIS — E785 Hyperlipidemia, unspecified: Secondary | ICD-10-CM | POA: Diagnosis present

## 2020-03-29 DIAGNOSIS — D509 Iron deficiency anemia, unspecified: Secondary | ICD-10-CM | POA: Diagnosis not present

## 2020-03-29 DIAGNOSIS — Z8249 Family history of ischemic heart disease and other diseases of the circulatory system: Secondary | ICD-10-CM | POA: Diagnosis not present

## 2020-03-29 DIAGNOSIS — Z83438 Family history of other disorder of lipoprotein metabolism and other lipidemia: Secondary | ICD-10-CM

## 2020-03-29 DIAGNOSIS — Z419 Encounter for procedure for purposes other than remedying health state, unspecified: Secondary | ICD-10-CM

## 2020-03-29 DIAGNOSIS — Z96641 Presence of right artificial hip joint: Secondary | ICD-10-CM | POA: Diagnosis not present

## 2020-03-29 DIAGNOSIS — Z471 Aftercare following joint replacement surgery: Secondary | ICD-10-CM | POA: Diagnosis not present

## 2020-03-29 DIAGNOSIS — N179 Acute kidney failure, unspecified: Secondary | ICD-10-CM | POA: Diagnosis not present

## 2020-03-29 DIAGNOSIS — Z96649 Presence of unspecified artificial hip joint: Secondary | ICD-10-CM

## 2020-03-29 DIAGNOSIS — Z96642 Presence of left artificial hip joint: Secondary | ICD-10-CM | POA: Diagnosis not present

## 2020-03-29 HISTORY — PX: TOTAL HIP ARTHROPLASTY: SHX124

## 2020-03-29 HISTORY — DX: Unilateral primary osteoarthritis, left hip: M16.12

## 2020-03-29 LAB — TYPE AND SCREEN
ABO/RH(D): B POS
Antibody Screen: NEGATIVE

## 2020-03-29 SURGERY — ARTHROPLASTY, HIP, TOTAL, ANTERIOR APPROACH
Anesthesia: Spinal | Site: Hip | Laterality: Left

## 2020-03-29 MED ORDER — ONDANSETRON HCL 4 MG/2ML IJ SOLN: 4.0000 mg | Freq: Four times a day (QID) | INTRAMUSCULAR | Status: DC | PRN

## 2020-03-29 MED ORDER — HYDROMORPHONE HCL 1 MG/ML IJ SOLN
INTRAMUSCULAR | Status: AC
  Filled 2020-03-29: qty 1

## 2020-03-29 MED ORDER — TRANEXAMIC ACID 1000 MG/10ML IV SOLN
INTRAVENOUS | Status: DC | PRN
  Administered 2020-03-29: 2000 mg via TOPICAL

## 2020-03-29 MED ORDER — ONDANSETRON HCL 4 MG/2ML IJ SOLN
INTRAMUSCULAR | Status: DC | PRN
  Administered 2020-03-29: 4 mg via INTRAVENOUS

## 2020-03-29 MED ORDER — DEXAMETHASONE SODIUM PHOSPHATE 10 MG/ML IJ SOLN: 8.0000 mg | Freq: Once | INTRAMUSCULAR | Status: DC

## 2020-03-29 MED ORDER — POVIDONE-IODINE 10 % EX SWAB
2.0000 "application " | Freq: Once | CUTANEOUS | Status: AC
  Administered 2020-03-29: 2 via TOPICAL

## 2020-03-29 MED ORDER — MORPHINE SULFATE (PF) 2 MG/ML IV SOLN: 0.5000 mg | INTRAVENOUS | Status: DC | PRN

## 2020-03-29 MED ORDER — METHOCARBAMOL 500 MG IVPB - SIMPLE MED
500.0000 mg | Freq: Four times a day (QID) | INTRAVENOUS | Status: DC | PRN
  Administered 2020-03-29: 500 mg via INTRAVENOUS
  Filled 2020-03-29: qty 50

## 2020-03-29 MED ORDER — SODIUM CHLORIDE 0.9 % IV SOLN: INTRAVENOUS | Status: DC

## 2020-03-29 MED ORDER — ONDANSETRON HCL 4 MG PO TABS: 4.0000 mg | ORAL_TABLET | Freq: Four times a day (QID) | ORAL | Status: DC | PRN

## 2020-03-29 MED ORDER — LACTATED RINGERS IV SOLN: INTRAVENOUS | Status: DC

## 2020-03-29 MED ORDER — 0.9 % SODIUM CHLORIDE (POUR BTL) OPTIME
TOPICAL | Status: DC | PRN
  Administered 2020-03-29: 1000 mL

## 2020-03-29 MED ORDER — VANCOMYCIN HCL IN DEXTROSE 1-5 GM/200ML-% IV SOLN: 1000.0000 mg | Freq: Two times a day (BID) | INTRAVENOUS | Status: DC

## 2020-03-29 MED ORDER — PROPOFOL 500 MG/50ML IV EMUL
INTRAVENOUS | Status: DC | PRN
  Administered 2020-03-29: 50 ug/kg/min via INTRAVENOUS

## 2020-03-29 MED ORDER — DOCUSATE SODIUM 100 MG PO CAPS
100.0000 mg | ORAL_CAPSULE | Freq: Two times a day (BID) | ORAL | Status: DC
  Administered 2020-03-29 – 2020-04-03 (×10): 100 mg via ORAL
  Filled 2020-03-29 (×10): qty 1

## 2020-03-29 MED ORDER — FENTANYL CITRATE (PF) 100 MCG/2ML IJ SOLN
INTRAMUSCULAR | Status: AC
  Filled 2020-03-29: qty 2

## 2020-03-29 MED ORDER — CHLORHEXIDINE GLUCONATE 0.12 % MT SOLN
15.0000 mL | Freq: Once | OROMUCOSAL | Status: AC
  Administered 2020-03-29: 15 mL via OROMUCOSAL

## 2020-03-29 MED ORDER — ORAL CARE MOUTH RINSE: 15.0000 mL | Freq: Once | OROMUCOSAL | Status: AC

## 2020-03-29 MED ORDER — BUPIVACAINE HCL 0.25 % IJ SOLN
INTRAMUSCULAR | Status: DC | PRN
  Administered 2020-03-29: 30 mL

## 2020-03-29 MED ORDER — RIVAROXABAN 10 MG PO TABS
10.0000 mg | ORAL_TABLET | Freq: Every day | ORAL | Status: DC
  Administered 2020-03-30 – 2020-04-03 (×5): 10 mg via ORAL
  Filled 2020-03-29 (×5): qty 1

## 2020-03-29 MED ORDER — EPHEDRINE SULFATE-NACL 50-0.9 MG/10ML-% IV SOSY
PREFILLED_SYRINGE | INTRAVENOUS | Status: DC | PRN
  Administered 2020-03-29 (×2): 7.5 mg via INTRAVENOUS

## 2020-03-29 MED ORDER — POLYETHYLENE GLYCOL 3350 17 G PO PACK: 17.0000 g | PACK | Freq: Every day | ORAL | Status: DC | PRN

## 2020-03-29 MED ORDER — PHENOL 1.4 % MT LIQD: 1.0000 | OROMUCOSAL | Status: DC | PRN

## 2020-03-29 MED ORDER — TRANEXAMIC ACID 1000 MG/10ML IV SOLN
2000.0000 mg | Freq: Once | INTRAVENOUS | Status: AC
  Filled 2020-03-29: qty 20

## 2020-03-29 MED ORDER — BUPIVACAINE IN DEXTROSE 0.75-8.25 % IT SOLN
INTRATHECAL | Status: DC | PRN
  Administered 2020-03-29: 1.8 mL via INTRATHECAL

## 2020-03-29 MED ORDER — FERROUS FUM-IRON POLYSACCH 162-115.2 MG PO CAPS: 1.0000 | ORAL_CAPSULE | Freq: Every day | ORAL | Status: DC

## 2020-03-29 MED ORDER — ACETAMINOPHEN 10 MG/ML IV SOLN
1000.0000 mg | Freq: Four times a day (QID) | INTRAVENOUS | Status: DC
  Administered 2020-03-29: 1000 mg via INTRAVENOUS

## 2020-03-29 MED ORDER — PHENYLEPHRINE HCL-NACL 10-0.9 MG/250ML-% IV SOLN
INTRAVENOUS | Status: DC | PRN
  Administered 2020-03-29: 25 ug/min via INTRAVENOUS

## 2020-03-29 MED ORDER — BISACODYL 10 MG RE SUPP: 10.0000 mg | Freq: Every day | RECTAL | Status: DC | PRN

## 2020-03-29 MED ORDER — DEXMEDETOMIDINE (PRECEDEX) IN NS 20 MCG/5ML (4 MCG/ML) IV SYRINGE
PREFILLED_SYRINGE | INTRAVENOUS | Status: AC
  Filled 2020-03-29: qty 5

## 2020-03-29 MED ORDER — GLYCOPYRROLATE PF 0.2 MG/ML IJ SOSY
PREFILLED_SYRINGE | INTRAMUSCULAR | Status: AC
  Filled 2020-03-29: qty 1

## 2020-03-29 MED ORDER — CEFAZOLIN SODIUM-DEXTROSE 2-4 GM/100ML-% IV SOLN
2.0000 g | Freq: Four times a day (QID) | INTRAVENOUS | Status: AC
  Administered 2020-03-29 – 2020-03-30 (×2): 2 g via INTRAVENOUS
  Filled 2020-03-29 (×2): qty 100

## 2020-03-29 MED ORDER — PROPOFOL 10 MG/ML IV BOLUS
INTRAVENOUS | Status: DC | PRN
  Administered 2020-03-29: 20 mg via INTRAVENOUS

## 2020-03-29 MED ORDER — TRIAMTERENE-HCTZ 37.5-25 MG PO TABS
1.0000 | ORAL_TABLET | Freq: Every day | ORAL | Status: DC
  Administered 2020-03-30 – 2020-04-03 (×5): 1 via ORAL
  Filled 2020-03-29 (×5): qty 1

## 2020-03-29 MED ORDER — METHOCARBAMOL 500 MG IVPB - SIMPLE MED
INTRAVENOUS | Status: AC
  Filled 2020-03-29: qty 50

## 2020-03-29 MED ORDER — DEXAMETHASONE SODIUM PHOSPHATE 10 MG/ML IJ SOLN
10.0000 mg | Freq: Once | INTRAMUSCULAR | Status: AC
  Administered 2020-03-30: 10 mg via INTRAVENOUS
  Filled 2020-03-29: qty 1

## 2020-03-29 MED ORDER — METHOCARBAMOL 500 MG PO TABS
500.0000 mg | ORAL_TABLET | Freq: Four times a day (QID) | ORAL | Status: DC | PRN
  Administered 2020-03-29 – 2020-04-02 (×10): 500 mg via ORAL
  Filled 2020-03-29 (×10): qty 1

## 2020-03-29 MED ORDER — CEFAZOLIN SODIUM-DEXTROSE 2-4 GM/100ML-% IV SOLN
2.0000 g | INTRAVENOUS | Status: AC
  Administered 2020-03-29: 2 g via INTRAVENOUS

## 2020-03-29 MED ORDER — ACETAMINOPHEN 10 MG/ML IV SOLN
INTRAVENOUS | Status: AC
  Filled 2020-03-29: qty 100

## 2020-03-29 MED ORDER — MEPERIDINE HCL 50 MG/ML IJ SOLN: 6.2500 mg | INTRAMUSCULAR | Status: DC | PRN

## 2020-03-29 MED ORDER — HYDROCODONE-ACETAMINOPHEN 5-325 MG PO TABS
1.0000 | ORAL_TABLET | ORAL | Status: DC | PRN
  Administered 2020-03-30 – 2020-03-31 (×3): 2 via ORAL
  Filled 2020-03-29 (×5): qty 2

## 2020-03-29 MED ORDER — LIDOCAINE 2% (20 MG/ML) 5 ML SYRINGE
INTRAMUSCULAR | Status: DC | PRN
  Administered 2020-03-29: 60 mg via INTRAVENOUS

## 2020-03-29 MED ORDER — MAGNESIUM CITRATE PO SOLN: 1.0000 | Freq: Once | ORAL | Status: DC | PRN

## 2020-03-29 MED ORDER — METOCLOPRAMIDE HCL 5 MG/ML IJ SOLN: 5.0000 mg | Freq: Three times a day (TID) | INTRAMUSCULAR | Status: DC | PRN

## 2020-03-29 MED ORDER — HYDROMORPHONE HCL 1 MG/ML IJ SOLN
0.2500 mg | INTRAMUSCULAR | Status: DC | PRN
  Administered 2020-03-29 (×2): 0.5 mg via INTRAVENOUS

## 2020-03-29 MED ORDER — ACETAMINOPHEN 325 MG PO TABS
325.0000 mg | ORAL_TABLET | Freq: Four times a day (QID) | ORAL | Status: DC | PRN
  Administered 2020-03-30: 650 mg via ORAL
  Filled 2020-03-29: qty 2

## 2020-03-29 MED ORDER — DEXMEDETOMIDINE (PRECEDEX) IN NS 20 MCG/5ML (4 MCG/ML) IV SYRINGE
PREFILLED_SYRINGE | INTRAVENOUS | Status: DC | PRN
  Administered 2020-03-29: 4 ug via INTRAVENOUS

## 2020-03-29 MED ORDER — BUPIVACAINE HCL 0.25 % IJ SOLN
INTRAMUSCULAR | Status: AC
  Filled 2020-03-29: qty 1

## 2020-03-29 MED ORDER — DEXAMETHASONE SODIUM PHOSPHATE 10 MG/ML IJ SOLN
INTRAMUSCULAR | Status: DC | PRN
  Administered 2020-03-29: 8 mg via INTRAVENOUS

## 2020-03-29 MED ORDER — ONDANSETRON HCL 4 MG/2ML IJ SOLN: 4.0000 mg | Freq: Once | INTRAMUSCULAR | Status: DC | PRN

## 2020-03-29 MED ORDER — FENTANYL CITRATE (PF) 100 MCG/2ML IJ SOLN
INTRAMUSCULAR | Status: DC | PRN
  Administered 2020-03-29: 100 ug via INTRAVENOUS

## 2020-03-29 MED ORDER — WATER FOR IRRIGATION, STERILE IR SOLN
Status: DC | PRN
  Administered 2020-03-29: 2000 mL

## 2020-03-29 MED ORDER — EPHEDRINE 5 MG/ML INJ
INTRAVENOUS | Status: AC
  Filled 2020-03-29: qty 20

## 2020-03-29 MED ORDER — GLYCOPYRROLATE 0.2 MG/ML IJ SOLN
INTRAMUSCULAR | Status: DC | PRN
  Administered 2020-03-29: .2 mg via INTRAVENOUS

## 2020-03-29 MED ORDER — METOCLOPRAMIDE HCL 5 MG PO TABS: 5.0000 mg | ORAL_TABLET | Freq: Three times a day (TID) | ORAL | Status: DC | PRN

## 2020-03-29 MED ORDER — TRAMADOL HCL 50 MG PO TABS
50.0000 mg | ORAL_TABLET | Freq: Four times a day (QID) | ORAL | Status: DC | PRN
  Administered 2020-03-29: 50 mg via ORAL
  Administered 2020-03-30 – 2020-04-01 (×3): 100 mg via ORAL
  Administered 2020-04-01: 50 mg via ORAL
  Administered 2020-04-02: 100 mg via ORAL
  Administered 2020-04-02: 50 mg via ORAL
  Administered 2020-04-02: 100 mg via ORAL
  Administered 2020-04-03: 50 mg via ORAL
  Filled 2020-03-29: qty 1
  Filled 2020-03-29: qty 2
  Filled 2020-03-29: qty 1
  Filled 2020-03-29 (×2): qty 2
  Filled 2020-03-29: qty 1
  Filled 2020-03-29 (×3): qty 2
  Filled 2020-03-29: qty 1

## 2020-03-29 MED ORDER — CEFAZOLIN SODIUM-DEXTROSE 2-4 GM/100ML-% IV SOLN
INTRAVENOUS | Status: AC
  Filled 2020-03-29: qty 100

## 2020-03-29 MED ORDER — POLYSACCHARIDE IRON COMPLEX 150 MG PO CAPS
150.0000 mg | ORAL_CAPSULE | Freq: Every day | ORAL | Status: DC
  Administered 2020-03-30 – 2020-04-03 (×5): 150 mg via ORAL
  Filled 2020-03-29 (×5): qty 1

## 2020-03-29 MED ORDER — MENTHOL 3 MG MT LOZG: 1.0000 | LOZENGE | OROMUCOSAL | Status: DC | PRN

## 2020-03-29 SURGICAL SUPPLY — 47 items
BAG DECANTER FOR FLEXI CONT (MISCELLANEOUS) IMPLANT
BAG SPEC THK2 15X12 ZIP CLS (MISCELLANEOUS)
BAG ZIPLOCK 12X15 (MISCELLANEOUS) IMPLANT
BLADE SAG 18X100X1.27 (BLADE) ×3 IMPLANT
CLOSURE WOUND 1/2 X4 (GAUZE/BANDAGES/DRESSINGS) ×1
COVER PERINEAL POST (MISCELLANEOUS) ×3 IMPLANT
COVER SURGICAL LIGHT HANDLE (MISCELLANEOUS) ×3 IMPLANT
COVER WAND RF STERILE (DRAPES) IMPLANT
CUP ACETBLR 48 OD SECTOR II (Hips) ×2 IMPLANT
DECANTER SPIKE VIAL GLASS SM (MISCELLANEOUS) ×3 IMPLANT
DRAPE STERI IOBAN 125X83 (DRAPES) ×3 IMPLANT
DRAPE U-SHAPE 47X51 STRL (DRAPES) ×6 IMPLANT
DRESSING AQUACEL AG SP 3.5X10 (GAUZE/BANDAGES/DRESSINGS) IMPLANT
DRSG ADAPTIC 3X8 NADH LF (GAUZE/BANDAGES/DRESSINGS) ×3 IMPLANT
DRSG AQUACEL AG ADV 3.5X10 (GAUZE/BANDAGES/DRESSINGS) ×3 IMPLANT
DRSG AQUACEL AG SP 3.5X10 (GAUZE/BANDAGES/DRESSINGS) ×3
DURAPREP 26ML APPLICATOR (WOUND CARE) ×3 IMPLANT
ELECT REM PT RETURN 15FT ADLT (MISCELLANEOUS) ×3 IMPLANT
EVACUATOR 1/8 PVC DRAIN (DRAIN) IMPLANT
GLOVE BIO SURGEON STRL SZ 6 (GLOVE) IMPLANT
GLOVE BIO SURGEON STRL SZ7 (GLOVE) IMPLANT
GLOVE BIO SURGEON STRL SZ8 (GLOVE) ×3 IMPLANT
GLOVE BIOGEL PI IND STRL 6.5 (GLOVE) IMPLANT
GLOVE BIOGEL PI IND STRL 7.0 (GLOVE) IMPLANT
GLOVE BIOGEL PI IND STRL 8 (GLOVE) ×1 IMPLANT
GLOVE BIOGEL PI INDICATOR 6.5 (GLOVE) ×2
GLOVE BIOGEL PI INDICATOR 7.0 (GLOVE)
GLOVE BIOGEL PI INDICATOR 8 (GLOVE) ×2
GOWN STRL REUS W/TWL LRG LVL3 (GOWN DISPOSABLE) ×3 IMPLANT
GOWN STRL REUS W/TWL XL LVL3 (GOWN DISPOSABLE) IMPLANT
HEAD FEM STD 28X+1.5 STRL (Hips) ×2 IMPLANT
HOLDER FOLEY CATH W/STRAP (MISCELLANEOUS) ×3 IMPLANT
KIT TURNOVER KIT A (KITS) ×2 IMPLANT
LINER MARATHON 28 48 (Hips) ×2 IMPLANT
MANIFOLD NEPTUNE II (INSTRUMENTS) ×3 IMPLANT
PACK ANTERIOR HIP CUSTOM (KITS) ×3 IMPLANT
PENCIL SMOKE EVACUATOR COATED (MISCELLANEOUS) ×3 IMPLANT
STEM FEM ACTIS STD SZ2 (Stem) ×2 IMPLANT
STRIP CLOSURE SKIN 1/2X4 (GAUZE/BANDAGES/DRESSINGS) ×2 IMPLANT
SUT ETHIBOND NAB CT1 #1 30IN (SUTURE) ×3 IMPLANT
SUT MNCRL AB 4-0 PS2 18 (SUTURE) ×3 IMPLANT
SUT STRATAFIX 0 PDS 27 VIOLET (SUTURE) ×3
SUT VIC AB 2-0 CT1 27 (SUTURE) ×6
SUT VIC AB 2-0 CT1 TAPERPNT 27 (SUTURE) ×2 IMPLANT
SUTURE STRATFX 0 PDS 27 VIOLET (SUTURE) ×1 IMPLANT
SYR 50ML LL SCALE MARK (SYRINGE) IMPLANT
TRAY FOLEY MTR SLVR 14FR STAT (SET/KITS/TRAYS/PACK) ×2 IMPLANT

## 2020-03-29 NOTE — Discharge Instructions (Addendum)
°Frank Aluisio, MD °Total Joint Specialist °[facility] °[address] °[place], [place] [address] °[phone] ° °ANTERIOR APPROACH TOTAL HIP REPLACEMENT POSTOPERATIVE DIRECTIONS ° ° ° ° °Hip Rehabilitation, Guidelines Following Surgery  °The results of a hip operation are greatly improved after range of motion and muscle strengthening exercises. Follow all safety measures which are given to protect your hip. If any of these exercises cause increased pain or swelling in your joint, decrease the amount until you are comfortable again. Then slowly increase the exercises. Call your caregiver if you have problems or questions.  ° °BLOOD CLOT PREVENTION °• Take a 10 mg Xarelto once a day for three weeks following surgery. Then resume one 81 mg aspirin once a day. °• You may resume your vitamins/supplements once you have discontinued the Xarelto. °• Do not take any NSAIDs (Advil, Aleve, Ibuprofen, Meloxicam, etc.) until you have discontinued the Xarelto.  ° °HOME CARE INSTRUCTIONS  °• Remove items at home which could result in a fall. This includes throw rugs or furniture in walking pathways.  °· ICE to the affected hip as frequently as 20-30 minutes an hour and then as needed for pain and swelling. Continue to use ice on the hip for pain and swelling from surgery. You may notice swelling that will progress down to the foot and ankle. This is normal after surgery. Elevate the leg when you are not up walking on it.   °· Continue to use the breathing machine which will help keep your temperature down.  It is common for your temperature to cycle up and down following surgery, especially at night when you are not up moving around and exerting yourself.  The breathing machine keeps your lungs expanded and your temperature down. ° °DIET °You may resume your previous home diet once your are discharged from the hospital. ° °DRESSING / WOUND CARE / SHOWERING °• You have an adhesive waterproof bandage  over the incision. Leave this in place until your first follow-up appointment. Once you remove this you will not need to place another bandage.  °• You may begin showering 3 days following surgery, but do not submerge the incision under water. ° °ACTIVITY °• For the first 3-5 days, it is important to rest and keep the operative leg elevated. You should, as a general rule, rest for 50 minutes and walk/stretch for 10 minutes per hour. After 5 days, you may slowly increase activity as tolerated.  °• Perform the exercises you were provided twice a day for about 15-20 minutes each session. Begin these 2 days following surgery. °• Walk with your walker as instructed. Use the walker until you are comfortable transitioning to a cane. Walk with the cane in the opposite hand of the operative leg. You may discontinue the cane once you are comfortable and walking steadily. °• Avoid periods of inactivity such as sitting longer than an hour when not asleep. This helps prevent blood clots.  °• Do not drive a car for 6 weeks or until released by your surgeon.  °• Do not drive while taking narcotics. ° °TED HOSE STOCKINGS °Wear the elastic stockings on both legs for three weeks following surgery during the day. You may remove them at night while sleeping. ° °WEIGHT BEARING °Weight bearing as tolerated with assist device (walker, cane, etc) as directed, use it as long as suggested by your surgeon or therapist, typically at least 4-6 weeks. ° °POSTOPERATIVE CONSTIPATION PROTOCOL °Constipation - defined medically as fewer than three   three stools per week and severe constipation as less than one stool per week.  One of the most common issues patients have following surgery is constipation.  Even if you have a regular bowel pattern at home, your normal regimen is likely to be disrupted due to multiple reasons following surgery.  Combination of anesthesia, postoperative narcotics, change in appetite and fluid intake all can affect  your bowels.  In order to avoid complications following surgery, here are some recommendations in order to help you during your recovery period.  . Colace (docusate) - Pick up an over-the-counter form of Colace or another stool softener and take twice a day as long as you are requiring postoperative pain medications.  Take with a full glass of water daily.  If you experience loose stools or diarrhea, hold the colace until you stool forms back up.  If your symptoms do not get better within 1 week or if they get worse, check with your doctor. . Dulcolax (bisacodyl) - Pick up over-the-counter and take as directed by the product packaging as needed to assist with the movement of your bowels.  Take with a full glass of water.  Use this product as needed if not relieved by Colace only.  . MiraLax (polyethylene glycol) - Pick up over-the-counter to have on hand.  MiraLax is a solution that will increase the amount of water in your bowels to assist with bowel movements.  Take as directed and can mix with a glass of water, juice, soda, coffee, or tea.  Take if you go more than two days without a movement.Do not use MiraLax more than once per day. Call your doctor if you are still constipated or irregular after using this medication for 7 days in a row.  If you continue to have problems with postoperative constipation, please contact the office for further assistance and recommendations.  If you experience "the worst abdominal pain ever" or develop nausea or vomiting, please contact the office immediatly for further recommendations for treatment.  ITCHING  If you experience itching with your medications, try taking only a single pain pill, or even half a pain pill at a time.  You can also use Benadryl over the counter for itching or also to help with sleep.   MEDICATIONS See your medication summary on the "After Visit Summary" that the nursing staff will review with you prior to discharge.  You may have some home  medications which will be placed on hold until you complete the course of blood thinner medication.  It is important for you to complete the blood thinner medication as prescribed by your surgeon.  Continue your approved medications as instructed at time of discharge.  PRECAUTIONS If you experience chest pain or shortness of breath - call 911 immediately for transfer to the hospital emergency department.  If you develop a fever greater that 101 F, purulent drainage from wound, increased redness or drainage from wound, foul odor from the wound/dressing, or calf pain - CONTACT YOUR SURGEON.                                                   FOLLOW-UP APPOINTMENTS Make sure you keep all of your appointments after your operation with your surgeon and caregivers. You should call the office at the above phone number and make an appointment for approximately  two weeks after the date of your surgery or on the date instructed by your surgeon outlined in the "After Visit Summary".  RANGE OF MOTION AND STRENGTHENING EXERCISES  These exercises are designed to help you keep full movement of your hip joint. Follow your caregiver's or physical therapist's instructions. Perform all exercises about fifteen times, three times per day or as directed. Exercise both hips, even if you have had only one joint replacement. These exercises can be done on a training (exercise) mat, on the floor, on a table or on a bed. Use whatever works the best and is most comfortable for you. Use music or television while you are exercising so that the exercises are a pleasant break in your day. This will make your life better with the exercises acting as a break in routine you can look forward to.  . Lying on your back, slowly slide your foot toward your buttocks, raising your knee up off the floor. Then slowly slide your foot back down until your leg is straight again.  . Lying on your back spread your legs as far apart as you can without  causing discomfort.  . Lying on your side, raise your upper leg and foot straight up from the floor as far as is comfortable. Slowly lower the leg and repeat.  . Lying on your back, tighten up the muscle in the front of your thigh (quadriceps muscles). You can do this by keeping your leg straight and trying to raise your heel off the floor. This helps strengthen the largest muscle supporting your knee.  . Lying on your back, tighten up the muscles of your buttocks both with the legs straight and with the knee bent at a comfortable angle while keeping your heel on the floor.   IF YOU ARE TRANSFERRED TO A SKILLED REHAB FACILITY If the patient is transferred to a skilled rehab facility following release from the hospital, a list of the current medications will be sent to the facility for the patient to continue.  When discharged from the skilled rehab facility, please have the facility set up the patient's [place] prior to being released. Also, the skilled facility will be responsible for providing the patient with their medications at time of release from the facility to include their pain medication, the muscle relaxants, and their blood thinner medication. If the patient is still at the rehab facility at time of the two week follow up appointment, the skilled rehab facility will also need to assist the patient in arranging follow up appointment in our office and any transportation needs.  MAKE SURE YOU:  . Understand these instructions.  . Get help right away if you are not doing well or get worse.    DENTAL ANTIBIOTICS:  In most cases prophylactic antibiotics for Dental procdeures after total joint surgery are not necessary.  Exceptions are as follows:  1. History of prior total joint infection  2. Severely immunocompromised (Organ Transplant, cancer chemotherapy, Rheumatoid biologic meds such as [place])  3. Poorly controlled diabetes (A1C &gt; 8.0, blood glucose over  200)  If you have one of these conditions, contact your surgeon for an antibiotic prescription, prior to your dental procedure.    Pick up stool softner and laxative for home use following surgery while on pain medications. Do not submerge incision under water. Please use good hand washing techniques while changing dressing each day. May shower starting three days after surgery. Please use a clean towel to  pat the incision dry following showers. Continue to use ice for pain and swelling after surgery. Do not use any lotions or creams on the incision until instructed by your surgeon. ____________________________________________________ Information on my medicine - XARELTO (Rivaroxaban)  This medication education was reviewed with me or my healthcare representative as part of my discharge preparation.    Why was Xarelto prescribed for you? Xarelto was prescribed for you to reduce the risk of blood clots forming after orthopedic surgery. The medical term for these abnormal blood clots is venous thromboembolism (VTE).  What do you need to know about xarelto ? Take your Xarelto ONCE DAILY at the same time every day. You may take it either with or without food.  If you have difficulty swallowing the tablet whole, you may crush it and mix in applesauce just prior to taking your dose.  Take Xarelto exactly as prescribed by your doctor and DO NOT stop taking Xarelto without talking to the doctor who prescribed the medication.  Stopping without other VTE prevention medication to take the place of Xarelto may increase your risk of developing a clot.  After discharge, you should have regular check-up appointments with your healthcare provider that is prescribing your Xarelto.    What do you do if you miss a dose? If you miss a dose, take it as soon as you remember on the same day then continue your regularly scheduled once daily regimen the next day. Do not take two doses of Xarelto on  the same day.   Important Safety Information A possible side effect of Xarelto is bleeding. You should call your healthcare provider right away if you experience any of the following: ? Bleeding from an injury or your nose that does not stop. ? Unusual colored urine (red or dark brown) or unusual colored stools (red or black). ? Unusual bruising for unknown reasons. ? A serious fall or if you hit your head (even if there is no bleeding).  Some medicines may interact with Xarelto and might increase your risk of bleeding while on Xarelto. To help avoid this, consult your healthcare provider or pharmacist prior to using any new prescription or non-prescription medications, including herbals, vitamins, non-steroidal anti-inflammatory drugs (NSAIDs) and supplements.  This website has more information on Xarelto: [email or web address].br.

## 2020-03-29 NOTE — Op Note (Signed)
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   POSTOPERATIVE DIAGNOSIS: Osteoarthritis of the Left  hip.   PROCEDURE: Left total hip arthroplasty, anterior approach.   SURGEON: Ollen Gross, MD   ASSISTANT: Dennie Bible, PA-C  ANESTHESIA:  Spinal  ESTIMATED BLOOD LOSS:-250 mL    DRAINS: Hemovac x1.   COMPLICATIONS: None   CONDITION: PACU - hemodynamically stable.   BRIEF CLINICAL NOTE: Ashley Ritter is a 79 y.o. female who has advanced end-  stage arthritis of their Left  hip with progressively worsening pain and  dysfunction.The patient has failed nonoperative management and presents for  total hip arthroplasty.   PROCEDURE IN DETAIL: After successful administration of spinal  anesthetic, the traction boots for the [facility] bed were placed on both  feet and the patient was placed onto the [facility] bed, boots placed into the leg  holders. The Left hip was then isolated from the perineum with plastic  drapes and prepped and draped in the usual sterile fashion. ASIS and  greater trochanter were marked and a oblique incision was made, starting  at about 1 cm lateral and 2 cm distal to the ASIS and coursing towards  the anterior cortex of the femur. The skin was cut with a 10 blade  through subcutaneous tissue to the level of the fascia overlying the  tensor fascia lata muscle. The fascia was then incised in line with the  incision at the junction of the anterior third and posterior 2/3rd. The  muscle was teased off the fascia and then the interval between the TFL  and the rectus was developed. The Hohmann retractor was then placed at  the top of the femoral neck over the capsule. The vessels overlying the  capsule were cauterized and the fat on top of the capsule was removed.  A Hohmann retractor was then placed anterior underneath the rectus  femoris to give exposure to the entire anterior capsule. A  T-shaped  capsulotomy was performed. The edges were tagged and the femoral head  was identified.       Osteophytes are removed off the superior acetabulum.  The femoral neck was then cut in situ with an oscillating saw. Traction  was then applied to the left lower extremity utilizing the [facility]  traction. The femoral head was then removed. Retractors were placed  around the acetabulum and then circumferential removal of the labrum was  performed. Osteophytes were also removed. Reaming starts at 45 mm to  medialize and  Increased in 2 mm increments to 47 mm. We reamed in  approximately 40 degrees of abduction, 20 degrees anteversion. A 48 mm  pinnacle acetabular shell was then impacted in anatomic position under  fluoroscopic guidance with excellent purchase. We did not need to place  any additional dome screws. A 28 mm neutral + 4 marathon liner was then  placed into the acetabular shell.       The femoral lift was then placed along the lateral aspect of the femur  just distal to the vastus ridge. The leg was  externally rotated and capsule  was stripped off the inferior aspect of the femoral neck down to the  level of the lesser trochanter, this was done with electrocautery. The femur was lifted after this was performed. The  leg was then placed in an extended and adducted position essentially delivering the femur. We also removed the capsule superiorly and the piriformis from the  piriformis fossa to gain excellent exposure of the  proximal femur. Rongeur was used to remove some cancellous bone to get  into the lateral portion of the proximal femur for placement of the  initial starter reamer. The starter broaches was placed  the starter broach  and was shown to go down the center of the canal. Broaching  with the Actis system was then performed starting at size 0  coursing  Up to size 3. A size 3 had excellent torsional and rotational  and axial stability. The trial standard offset neck was  then placed  with a 28 + 1.5 trial head. The hip was then reduced. We confirmed that  the stem was in the canal both on AP and lateral x-rays. It also has excellent sizing. The hip was reduced with outstanding stability through full extension and full external rotation.. AP pelvis was taken and the leg lengths were measured and found to be equal. Hip was then dislocated again and the femoral head and neck removed. The  femoral broach was removed. Size 3 Actis stem with a standard offset  neck was then impacted into the femur following native anteversion. Has  excellent purchase in the canal. Excellent torsional and rotational and  axial stability. It is confirmed to be in the canal on AP and lateral  fluoroscopic views. The 28 + 1.5 metal head was placed and the hip  reduced with outstanding stability. Again AP pelvis was taken and it  confirmed that the leg lengths were equal. The wound was then copiously  irrigated with saline solution and the capsule reattached and repaired  with Ethibond suture. 30 ml of .25% Bupivicaine was  injected into the capsule and into the edge of the tensor fascia lata as well as subcutaneous tissue. The fascia overlying the tensor fascia lata was then closed with a running #1 V-Loc. Subcu was closed with interrupted 2-0 Vicryl and subcuticular running 4-0 Monocryl. Incision was cleaned  and dried. Steri-Strips and a bulky sterile dressing applied. Hemovac  drain was hooked to suction and then the patient was awakened and transported to  recovery in stable condition.        Please note that a surgical assistant was a medical necessity for this procedure to perform it in a safe and expeditious manner. Assistant was necessary to provide appropriate retraction of vital neurovascular structures and to prevent femoral fracture and allow for anatomic placement of the prosthesis.  Ollen Gross, M.D.

## 2020-03-29 NOTE — Anesthesia Preprocedure Evaluation (Signed)
Anesthesia Evaluation  Patient identified by MRN, date of birth, ID band Patient awake    Reviewed: Allergy & Precautions, NPO status , Patient's Chart, lab work & pertinent test results  Airway Mallampati: I  TM Distance: >3 FB Neck ROM: Full    Dental   Pulmonary former smoker,    Pulmonary exam normal        Cardiovascular hypertension, Pt. on medications Normal cardiovascular exam     Neuro/Psych    GI/Hepatic   Endo/Other    Renal/GU      Musculoskeletal   Abdominal   Peds  Hematology   Anesthesia Other Findings   Reproductive/Obstetrics                             Anesthesia Physical Anesthesia Plan  ASA: III  Anesthesia Plan: Spinal   Post-op Pain Management:    Induction: Intravenous  PONV Risk Score and Plan: 2  Airway Management Planned: Nasal Cannula  Additional Equipment:   Intra-op Plan:   Post-operative Plan:   Informed Consent: I have reviewed the patients History and Physical, chart, labs and discussed the procedure including the risks, benefits and alternatives for the proposed anesthesia with the patient or authorized representative who has indicated his/her understanding and acceptance.       Plan Discussed with: CRNA and Surgeon  Anesthesia Plan Comments:         Anesthesia Quick Evaluation

## 2020-03-29 NOTE — Anesthesia Postprocedure Evaluation (Signed)
Anesthesia Post Note  Patient: Ashley Ritter  Procedure(s) Performed: TOTAL HIP ARTHROPLASTY ANTERIOR APPROACH (Left Hip)     Patient location during evaluation: PACU Anesthesia Type: Spinal Level of consciousness: oriented and awake and alert Pain management: pain level controlled Vital Signs Assessment: post-procedure vital signs reviewed and stable Respiratory status: spontaneous breathing, respiratory function stable and patient connected to nasal cannula oxygen Cardiovascular status: blood pressure returned to baseline and stable Postop Assessment: no headache, no backache and no apparent nausea or vomiting Anesthetic complications: no   No complications documented.  Last Vitals:  Vitals:   03/29/20 1445 03/29/20 1452  BP: (!) 153/74 (!) 146/65  Pulse: 81 (!) 54  Resp: 12 14  Temp:  (!) 36.4 C  SpO2: 100% 100%    Last Pain:  Vitals:   03/29/20 1452  TempSrc:   PainSc: Asleep    LLE Motor Response: Purposeful movement;Responds to commands (03/29/20 1452) LLE Sensation: Decreased;Pain (burning) (03/29/20 1452) RLE Motor Response: Purposeful movement;Responds to commands (03/29/20 1452) RLE Sensation: Decreased (03/29/20 1452) L Sensory Level: S3-Medial thigh (03/29/20 1452) R Sensory Level: S3-Medial thigh (03/29/20 1452)  Starkeisha Vanwinkle DAVID

## 2020-03-29 NOTE — Anesthesia Procedure Notes (Signed)
Spinal  Start time: 03/29/2020 12:08 PM End time: 03/29/2020 12:11 PM Staffing Performed: anesthesiologist  Anesthesiologist: Arta Bruce, MD Preanesthetic Checklist Completed: patient identified, IV checked, risks and benefits discussed, surgical consent, monitors and equipment checked, pre-op evaluation and timeout performed Spinal Block Patient position: sitting Prep: DuraPrep Patient monitoring: blood pressure, continuous pulse ox, cardiac monitor and heart rate Approach: right paramedian Location: L3-4 Injection technique: single-shot Needle Needle type: Pencan  Needle gauge: 24 G Needle length: 9 cm Needle insertion depth: 8 cm

## 2020-03-29 NOTE — Transfer of Care (Signed)
Immediate Anesthesia Transfer of Care Note  Patient: Ashley Ritter  Procedure(s) Performed: Procedure(s) with comments: TOTAL HIP ARTHROPLASTY ANTERIOR APPROACH (Left) -  Patient Location: PACU  Anesthesia Type:Spinal  Level of Consciousness:  sedated, patient cooperative and responds to stimulation  Airway & Oxygen Therapy:Patient Spontanous Breathing and Patient connected to face mask oxgen  Post-op Assessment:  Report given to PACU RN and Post -op Vital signs reviewed and stable  Post vital signs:  Reviewed and stable  Last Vitals:  Vitals:   03/29/20 1114 03/29/20 1354  BP: (!) 170/73   Pulse: 73 83  Resp: 17 17  Temp: 36.9 C   SpO2: 100% 100%    Complications: No apparent anesthesia complications

## 2020-03-29 NOTE — Evaluation (Signed)
Physical Therapy Evaluation Patient Details Name: Ashley Ritter MRN: [id number] DOB: Sep 29, 1940 Today's Date: 03/29/2020   History of Present Illness  Pt is 79 yo female who is s/p L anterior THA on 03/29/20.  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  Clinical Impression  Pt is s/p anterior L THA resulting in the deficits listed below (see PT Problem List). Pt was evaluated on DOS and was limited due to pain and nausea.  Pt initially reports minimal pain "2/10, I know its there;" however, in weight bearing pain increased and pt with limited ability to weight bear and step.  Encouraged pt to take medicines as prescribed/needed to allow for movement. Pt required min -mod A for transfers and was not able to take any true steps.  Also, needed frequent cueing to not pivot on surgical leg.  Pt does report that she has DME and that her daughter will stay with her at discharge.  However, pt will need to make significant improvements prior to this as she needs to be able to ambulate and has 5 steps in her home.  If does not Improve may need SNF.  Pt will benefit from skilled PT to increase their independence and safety with mobility to allow discharge to the venue listed below.      Follow Up Recommendations Follow surgeon's recommendation for DC plan and follow-up therapies;Supervision/Assistance - 24 hour;Home health PT (hopeful for progression but if not may need SNF)    Equipment Recommendations  None recommended by PT    Recommendations for Other Services       Precautions / Restrictions Precautions Precautions: Fall;Anterior Hip Precaution Comments: anterior hip no precautions Restrictions Weight Bearing Restrictions: Yes LLE Weight Bearing: Weight bearing as tolerated      Mobility  Bed Mobility Overal bed mobility: Needs Assistance Bed Mobility: Supine to Sit;Sit to Supine     Supine to sit: Min assist;HOB elevated Sit to supine: Mod assist;+2 for  physical assistance   General bed mobility comments: To move to sitting required cues for sequencing and assist for L LE and used therapist hand to lift trunk; for return to supine provided mod A of 2 for pain control    Transfers Overall transfer level: Needs assistance Equipment used: Rolling walker (2 wheeled) Transfers: Sit to/from Stand Sit to Stand: Min assist;+2 safety/equipment         General transfer comment: cues for safe hand placement and to come to complete standing; required increased time  Ambulation/Gait Ambulation/Gait assistance: Min assist;+2 safety/equipment Gait Distance (Feet): 1 Feet Assistive device: Rolling walker (2 wheeled) Gait Pattern/deviations: Shuffle Gait velocity: decreased   General Gait Details: Pt only able to take a few side steps at EOB limited due to nausea and pain.  Pt was able to slide L LE but had to pivot on R LE due to pain with weight bearing.  Also, required multiple cues not to pivot on L LE.  Stairs            Wheelchair Mobility    Modified Rankin (Stroke Patients Only)       Balance Overall balance assessment: Needs assistance Sitting-balance support: No upper extremity supported Sitting balance-Leahy Scale: Good     Standing balance support: Bilateral upper extremity supported Standing balance-Leahy Scale: Poor Standing balance comment: required RW and min guard  Pertinent Vitals/Pain Pain Assessment: Faces Pain Score: 2  (2/10 rest but increased with activity did not rate - request meds) Faces Pain Scale: Hurts even more (with activity) Pain Location: L hip Pain Descriptors / Indicators: Discomfort Pain Intervention(s): Limited activity within patient's tolerance;Monitored during session;Repositioned;Relaxation;Patient requesting pain meds-RN notified    Home Living Family/patient expects to be discharged to:: Private residence Living Arrangements: Alone (alone but  daughter to stay with her) Available Help at Discharge: Family;Available 24 hours/day Type of Home: House Home Access: Level entry     Home Layout: Multi-level (split) Home Equipment: Cane - single point;Grab bars - tub/shower;Shower seat;Bedside commode      Prior Function Level of Independence: Independent with assistive device(s)         Comments: Uses cane; able to to IADLs and ADLs and community ambulation, drives     Hand Dominance        Extremity/Trunk Assessment   Upper Extremity Assessment Upper Extremity Assessment: Overall WFL for tasks assessed    Lower Extremity Assessment Lower Extremity Assessment: LLE deficits/detail;RLE deficits/detail RLE Deficits / Details: WFL LLE Deficits / Details: ROM WFL; MMT ankle 5/5, knee 3/5, hip 1/5 LLE Sensation: WNL    Cervical / Trunk Assessment Cervical / Trunk Assessment: Normal  Communication   Communication: No difficulties  Cognition Arousal/Alertness: Awake/alert Behavior During Therapy: WFL for tasks assessed/performed Overall Cognitive Status: Within Functional Limits for tasks assessed                                        General Comments General comments (skin integrity, edema, etc.): VSS; Pt with c/o nausea - given ginger ale and crackers that seemed to help; however, still had nausea and pain in standing that limited.  Educated on ankle pumps and quad sets for tonight and that therpay would f/u tomorrow.    Exercises Total Joint Exercises Ankle Circles/Pumps: AROM;10 reps;Supine;Both Quad Sets: AROM;Both;5 reps;Supine   Assessment/Plan    PT Assessment Patient needs continued PT services  PT Problem List Decreased strength;Decreased mobility;Decreased safety awareness;Decreased range of motion;Decreased activity tolerance;Decreased knowledge of precautions;Decreased balance;Decreased knowledge of use of DME;Pain       PT Treatment Interventions DME instruction;Therapeutic  activities;Modalities;Gait training;Therapeutic exercise;Patient/family education;Stair training;Balance training;Functional mobility training    PT Goals (Current goals can be found in the Care Plan section)  Acute Rehab PT Goals Patient Stated Goal: return to walking PT Goal Formulation: With patient Time For Goal Achievement: 04/12/20 Potential to Achieve Goals: Good    Frequency 7X/week   Barriers to discharge Inaccessible home environment      Co-evaluation               AM-PAC PT "6 Clicks" Mobility  Outcome Measure Help needed turning from your back to your side while in a flat bed without using bedrails?: A Lot Help needed moving from lying on your back to sitting on the side of a flat bed without using bedrails?: A Lot Help needed moving to and from a bed to a chair (including a wheelchair)?: A Lot Help needed standing up from a chair using your arms (e.g., wheelchair or bedside chair)?: A Lot Help needed to walk in hospital room?: A Lot Help needed climbing 3-5 steps with a railing? : Total 6 Click Score: 11    End of Session Equipment Utilized During Treatment: Gait belt Activity Tolerance: Patient limited by pain;Other (  comment) (limited by pain and nausea) Patient left: in bed;with call bell/phone within reach;with bed alarm set;with SCD's reapplied Nurse Communication: Mobility status;Patient requests pain meds PT Visit Diagnosis: Unsteadiness on feet (R26.81);Other abnormalities of gait and mobility (R26.89);Muscle weakness (generalized) (M62.81)    Time: 0109-3235 PT Time Calculation (min) (ACUTE ONLY): 34 min   Charges:   PT Evaluation $PT Eval Moderate Complexity: 1 Mod PT Treatments $Therapeutic Activity: 8-22 mins        Ashley Ritter, PT Acute Rehab Services Pager [phone] [facility] [phone]    Rayetta Humphrey 03/29/2020, 5:58 PM

## 2020-03-29 NOTE — Interval H&P Note (Signed)
History and Physical Interval Note:  03/29/2020 11:10 AM  Ashley Ritter  has presented today for surgery, with the diagnosis of left hip osteoarthritis.  The various methods of treatment have been discussed with the patient and family. After consideration of risks, benefits and other options for treatment, the patient has consented to  Procedure(s) with comments: TOTAL HIP ARTHROPLASTY ANTERIOR APPROACH (Left) - as a surgical intervention.  The patient's history has been reviewed, patient examined, no change in status, stable for surgery.  I have reviewed the patient's chart and labs.  Questions were answered to the patient's satisfaction.     Homero Fellers Deaja Rizo

## 2020-03-30 ENCOUNTER — Encounter (HOSPITAL_COMMUNITY): Payer: Self-pay | Admitting: Orthopedic Surgery

## 2020-03-30 DIAGNOSIS — Z87891 Personal history of nicotine dependence: Secondary | ICD-10-CM | POA: Diagnosis not present

## 2020-03-30 DIAGNOSIS — Z8249 Family history of ischemic heart disease and other diseases of the circulatory system: Secondary | ICD-10-CM | POA: Diagnosis not present

## 2020-03-30 DIAGNOSIS — Z83438 Family history of other disorder of lipoprotein metabolism and other lipidemia: Secondary | ICD-10-CM | POA: Diagnosis not present

## 2020-03-30 DIAGNOSIS — E785 Hyperlipidemia, unspecified: Secondary | ICD-10-CM | POA: Diagnosis present

## 2020-03-30 DIAGNOSIS — Z7982 Long term (current) use of aspirin: Secondary | ICD-10-CM | POA: Diagnosis not present

## 2020-03-30 DIAGNOSIS — Z79899 Other long term (current) drug therapy: Secondary | ICD-10-CM | POA: Diagnosis not present

## 2020-03-30 DIAGNOSIS — Z833 Family history of diabetes mellitus: Secondary | ICD-10-CM | POA: Diagnosis not present

## 2020-03-30 DIAGNOSIS — M1612 Unilateral primary osteoarthritis, left hip: Secondary | ICD-10-CM | POA: Diagnosis present

## 2020-03-30 DIAGNOSIS — Z96641 Presence of right artificial hip joint: Secondary | ICD-10-CM | POA: Diagnosis present

## 2020-03-30 DIAGNOSIS — I10 Essential (primary) hypertension: Secondary | ICD-10-CM | POA: Diagnosis present

## 2020-03-30 DIAGNOSIS — Z880 Allergy status to penicillin: Secondary | ICD-10-CM | POA: Diagnosis not present

## 2020-03-30 DIAGNOSIS — D509 Iron deficiency anemia, unspecified: Secondary | ICD-10-CM | POA: Diagnosis present

## 2020-03-30 DIAGNOSIS — Z90710 Acquired absence of both cervix and uterus: Secondary | ICD-10-CM | POA: Diagnosis not present

## 2020-03-30 DIAGNOSIS — Z885 Allergy status to narcotic agent status: Secondary | ICD-10-CM | POA: Diagnosis not present

## 2020-03-30 LAB — CBC
HCT: 31.1 % — ABNORMAL LOW (ref 36.0–46.0)
Hemoglobin: 9.7 g/dL — ABNORMAL LOW (ref 12.0–15.0)
MCH: 28.2 pg (ref 26.0–34.0)
MCHC: 31.2 g/dL (ref 30.0–36.0)
MCV: 90.4 fL (ref 80.0–100.0)
Platelets: 243 10*3/uL (ref 150–400)
RBC: 3.44 MIL/uL — ABNORMAL LOW (ref 3.87–5.11)
RDW: 12.8 % (ref 11.5–15.5)
WBC: 16.5 10*3/uL — ABNORMAL HIGH (ref 4.0–10.5)
nRBC: 0 % (ref 0.0–0.2)

## 2020-03-30 LAB — BASIC METABOLIC PANEL
Anion gap: 7 (ref 5–15)
BUN: 14 mg/dL (ref 8–23)
CO2: 28 mmol/L (ref 22–32)
Calcium: 8.8 mg/dL — ABNORMAL LOW (ref 8.9–10.3)
Chloride: 102 mmol/L (ref 98–111)
Creatinine, Ser: 0.96 mg/dL (ref 0.44–1.00)
GFR, Estimated: 56 mL/min — ABNORMAL LOW (ref >60)
Glucose, Bld: 152 mg/dL — ABNORMAL HIGH (ref 70–99)
Potassium: 4.8 mmol/L (ref 3.5–5.1)
Sodium: 137 mmol/L (ref 135–145)

## 2020-03-30 NOTE — Care Plan (Signed)
Ortho Bundle Case Management Note  Patient Details  Name: Ashley Ritter MRN: [id number] Date of Birth: 09-Aug-1940   L THA on 03-29-20 DCP:  Home with dtr.  2 story home with 5 ste. DME:  No needs. Has a RW and 3-in-1 PT:  HEP                     DME Arranged:  N/A DME Agency:  NA  HH Arranged:  NA HH Agency:  NA  Additional Comments: Please contact me with any questions of if this plan should need to change.  Ennis Forts, RN,CCM [facility]  [phone] 03/30/2020, 8:57 AM

## 2020-03-30 NOTE — Progress Notes (Signed)
Physical Therapy Treatment Patient Details Name: Ashley Ritter MRN: [id number] DOB: November 17, 1940 Today's Date: 03/30/2020    History of Present Illness Pt is 79 yo female who is s/p L anterior THA on 03/29/20.  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    PT Comments    Pt continues to be limited by high pain with mobility. Pt very motivated to ambulate and transfer, but requires significantly increased time and rest breaks to complete. Pt tolerates therapeutic exercise with initial reps being painful and decreased ROM, both improving with reps. Pt tolerates standing for longer duration this session and able to clear L foot from floor, but continues to slide RLE along floor due to decreased LLE WB. Cued pt on using BUE on RW to assist and foot placement to reduce L hip pain, but continues to be limited by high pain. Assisted pt back to supine with mod A and min A to recenter in bed. Pt becomes tearful regarding d/c home due to 5 steps to navigate and frustration with limited progress due to high pain when mobilizing. RN notified of pain and d/c planning due to slow progress with therapy. Pt will benefit from continued physical therapy in hospital and recommendations below to increase strength, balance, endurance for safe ADLs and gait.   Follow Up Recommendations  Follow surgeon's recommendation for DC plan and follow-up therapies;Supervision/Assistance - 24 hour;Home health PT (hopeful for progression but if not may need SNF)     Equipment Recommendations  None recommended by PT    Recommendations for Other Services       Precautions / Restrictions Precautions Precautions: Fall;Anterior Hip Precaution Comments: anterior hip no precautions Restrictions Weight Bearing Restrictions: No LLE Weight Bearing: Weight bearing as tolerated    Mobility  Bed Mobility Overal bed mobility: Needs Assistance Bed Mobility: Sit to Supine Sit to supine: Mod assistGeneral  bed mobility comments: mod A for BLE lifting back into bed, cues for hand placement and centering body in bed with min A  Transfers Overall transfer level: Needs assistance Equipment used: Rolling walker (2 wheeled) Transfers: Sit to/from Stand Sit to Stand: Mod assist  General transfer comment: mod A from bedside chair, BUE assisting to power up, slow to rise with cues for glute and quad activation for upright posture  Ambulation/Gait Ambulation/Gait assistance: Min assist Gait Distance (Feet): 3 Feet Assistive device: Rolling walker (2 wheeled) Gait Pattern/deviations: Shuffle Gait velocity: decreased   General Gait Details: significantly increased time with multiple standing rest breaks with limited slow, shuffling steps from chair back to bed, cues for foot placemetn and BUE use on RW to reduce pain   Stairs             Wheelchair Mobility    Modified Rankin (Stroke Patients Only)       Balance Overall balance assessment: Needs assistance Sitting-balance support: Feet supported;No upper extremity supported Sitting balance-Leahy Scale: Good Sitting balance - Comments: seated EOB   Standing balance support: During functional activity;Bilateral upper extremity supported Standing balance-Leahy Scale: Poor Standing balance comment: reliant on UE support       Cognition Arousal/Alertness: Awake/alert Behavior During Therapy: WFL for tasks assessed/performed Overall Cognitive Status: Within Functional Limits for tasks assessed       Exercises Total Joint Exercises Ankle Circles/Pumps: AROM;Both;10 reps Quad Sets: AROM;Strengthening;Left;10 reps Gluteal Sets: AROM;Strengthening;Both;10 reps Heel Slides: AROM;Strengthening;Left;10 reps Hip ABduction/ADduction: AROM;Strengthening;Left;10 reps Long Arc Quad: Seated;AAROM;Strengthening;Left;5 reps (AAROM progressing to AROM for final 2  reps) General Exercises - Lower Extremity Toe Raises:  Seated;AROM;Strengthening;Both;15 reps Heel Raises: Seated;AROM;Strengthening;Both;15 reps    General Comments General comments (skin integrity, edema, etc.): c/o nausea with mobility, given water and cold washcloth with rest break that resolved issues      Pertinent Vitals/Pain Pain Assessment: Faces Pain Score: 3  Faces Pain Scale: Hurts even more Pain Location: L hip with activity Pain Descriptors / Indicators: Throbbing;Burning Pain Intervention(s): Limited activity within patient's tolerance;Monitored during session;Premedicated before session;Repositioned;Ice applied    Home Living                      Prior Function            PT Goals (current goals can now be found in the care plan section) Acute Rehab PT Goals Patient Stated Goal: return to walking PT Goal Formulation: With patient Time For Goal Achievement: 04/12/20 Potential to Achieve Goals: Good Progress towards PT goals: Progressing toward goals (very slowly, limited by pain)    Frequency    7X/week      PT Plan Current plan remains appropriate    Co-evaluation              AM-PAC PT "6 Clicks" Mobility   Outcome Measure  Help needed turning from your back to your side while in a flat bed without using bedrails?: A Lot Help needed moving from lying on your back to sitting on the side of a flat bed without using bedrails?: A Lot Help needed moving to and from a bed to a chair (including a wheelchair)?: A Lot Help needed standing up from a chair using your arms (e.g., wheelchair or bedside chair)?: A Lot Help needed to walk in hospital room?: A Lot Help needed climbing 3-5 steps with a railing? : Total 6 Click Score: 11    End of Session Equipment Utilized During Treatment: Gait belt Activity Tolerance: Patient limited by pain Patient left: in bed;with call bell/phone within reach Nurse Communication: Mobility status;Other (comment) (pain, slow progress) PT Visit Diagnosis:  Unsteadiness on feet (R26.81);Other abnormalities of gait and mobility (R26.89);Muscle weakness (generalized) (M62.81)     Time: 4627-0350 PT Time Calculation (min) (ACUTE ONLY): 42 min  Charges:  $Gait Training: 8-22 mins $Therapeutic Exercise: 8-22 mins $Therapeutic Activity: 8-22 mins                      Tori Alana Dayton PT, DPT 03/30/20, 1:00 PM

## 2020-03-30 NOTE — Progress Notes (Signed)
Physical Therapy Treatment Patient Details Name: Ashley Ritter MRN: [id number] DOB: 1941/03/30 Today's Date: 03/30/2020    History of Present Illness Pt is 79 yo female who is s/p L anterior THA on 03/29/20.  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    PT Comments    Pt continues to require min-modx2 assist with mobility due to high pain. Pt very motivated to mobilize. Pt limited to a few shuffling steps at bedside, cued on foot placement and use of BUE on RW to reduce pain and maintain safety. Pt able to clear L foot 1-2 inches in stance and unable to clear R foot from floor. Pt tolerates therapeutic exercises with cues for form and educated on performing additional set of quad sets and ankle pumps after breakfast if tolerable; pt verbalized understanding. Pt will continue to benefit from acute PT services due to weakness and high pain limiting tranfers and ambulation tolerance with hopes to d/c home with family support.   Follow Up Recommendations  Follow surgeon's recommendation for DC plan and follow-up therapies;Supervision/Assistance - 24 hour;Home health PT (hopeful for progression but if not may need SNF)     Equipment Recommendations  None recommended by PT    Recommendations for Other Services       Precautions / Restrictions Precautions Precautions: Fall;Anterior Hip Precaution Comments: anterior hip no precautions Restrictions Weight Bearing Restrictions: No LLE Weight Bearing: Weight bearing as tolerated    Mobility  Bed Mobility Overal bed mobility: Needs Assistance Bed Mobility: Supine to Sit  Supine to sit: Mod assist;+2 for physical assistance;HOB elevated  General bed mobility comments: heavy cues for sequencing to come to sit EOB, mod A for LLE with additional help to upright trunk, high pain so required increased time  Transfers Overall transfer level: Needs assistance Equipment used: Rolling walker (2 wheeled) Transfers:  Sit to/from Stand Sit to Stand: Min assist;+2 safety/equipment;From elevated surface  General transfer comment: cues for safe hand placement, increased time with rising, cues for glute activation and upright posture  Ambulation/Gait Ambulation/Gait assistance: Min assist;+2 safety/equipment Gait Distance (Feet): 3 Feet Assistive device: Rolling walker (2 wheeled) Gait Pattern/deviations: Shuffle Gait velocity: decreased   General Gait Details: limited to slow, shuffling sidesteps at bedside, able to clear L foot 1-2 inches in static standing, cues for foot placement and BUE involvement on RW to reduce pain   Stairs             Wheelchair Mobility    Modified Rankin (Stroke Patients Only)       Balance Overall balance assessment: Needs assistance Sitting-balance support: Feet supported;No upper extremity supported Sitting balance-Leahy Scale: Good Sitting balance - Comments: seated EOB   Standing balance support: During functional activity;Bilateral upper extremity supported Standing balance-Leahy Scale: Poor Standing balance comment: reliant on UE support       Cognition Arousal/Alertness: Awake/alert Behavior During Therapy: WFL for tasks assessed/performed Overall Cognitive Status: Within Functional Limits for tasks assessed       Exercises Total Joint Exercises Ankle Circles/Pumps: Seated;AROM;Right;20 reps Quad Sets: Seated;AROM;Strengthening;Left;5 reps Long Arc Quad: Seated;AAROM;Strengthening;Left;5 reps (AAROM progressing to AROM for final 2 reps) General Exercises - Lower Extremity Toe Raises: Seated;AROM;Strengthening;Both;15 reps Heel Raises: Seated;AROM;Strengthening;Both;15 reps    General Comments General comments (skin integrity, edema, etc.): c/o nausea with mobility, given water and cold washcloth with rest break that resolved issues      Pertinent Vitals/Pain Pain Assessment: Faces Pain Score: 3  Faces Pain Scale: Hurts  even more Pain  Location: L hip Pain Descriptors / Indicators: Throbbing;Burning Pain Intervention(s): Limited activity within patient's tolerance;Monitored during session;Premedicated before session;Repositioned;Ice applied    Home Living                      Prior Function            PT Goals (current goals can now be found in the care plan section) Acute Rehab PT Goals Patient Stated Goal: return to walking PT Goal Formulation: With patient Time For Goal Achievement: 04/12/20 Potential to Achieve Goals: Good Progress towards PT goals: Progressing toward goals    Frequency    7X/week      PT Plan Current plan remains appropriate    Co-evaluation              AM-PAC PT "6 Clicks" Mobility   Outcome Measure  Help needed turning from your back to your side while in a flat bed without using bedrails?: A Lot Help needed moving from lying on your back to sitting on the side of a flat bed without using bedrails?: A Lot Help needed moving to and from a bed to a chair (including a wheelchair)?: A Lot Help needed standing up from a chair using your arms (e.g., wheelchair or bedside chair)?: A Lot Help needed to walk in hospital room?: A Lot Help needed climbing 3-5 steps with a railing? : Total 6 Click Score: 11    End of Session Equipment Utilized During Treatment: Gait belt Activity Tolerance: Patient limited by pain;Patient tolerated treatment well Patient left: in chair;with call bell/phone within reach;with chair alarm set Nurse Communication: Mobility status PT Visit Diagnosis: Unsteadiness on feet (R26.81);Other abnormalities of gait and mobility (R26.89);Muscle weakness (generalized) (M62.81)     Time: 4944-9675 PT Time Calculation (min) (ACUTE ONLY): 34 min  Charges:  $Therapeutic Exercise: 8-22 mins $Therapeutic Activity: 8-22 mins                      Tori Petrita Blunck PT, DPT 03/30/20, 11:16 AM

## 2020-03-30 NOTE — Progress Notes (Signed)
° °  Subjective: 1 Day Post-Op Procedure(s) (LRB): TOTAL HIP ARTHROPLASTY ANTERIOR APPROACH (Left) Patient reports pain as mild.   Patient seen in rounds with Dr. Lequita Halt. Patient is well, and has had no acute complaints or problems other than soreness in the left hip. Denies chest pain or SOB. No issues overnight. Foley catheter removed this AM. We will continue therapy today.   Objective: Vital signs in last 24 hours: Temp:  [97.5 F (36.4 C)-98.5 F (36.9 C)] 98.2 F (36.8 C) (10/21 5643) Pulse Rate:  [46-83] 53 (10/21 0613) Resp:  [12-18] 17 (10/21 0613) BP: (125-170)/(56-80) 129/57 (10/21 0613) SpO2:  [92 %-100 %] 99 % (10/21 3295) Weight:  [90.8 kg] 90.8 kg (10/20 1132)  Intake/Output from previous day:  Intake/Output Summary (Last 24 hours) at 03/30/2020 0745 Last data filed at 03/30/2020 0612 Gross per 24 hour  Intake 2594.87 ml  Output 2100 ml  Net 494.87 ml     Intake/Output this shift: No intake/output data recorded.  Labs: Recent Labs    03/30/20 0318  HGB 9.7*   Recent Labs    03/30/20 0318  WBC 16.5*  RBC 3.44*  HCT 31.1*  PLT 243   Recent Labs    03/30/20 0318  NA 137  K 4.8  CL 102  CO2 28  BUN 14  CREATININE 0.96  GLUCOSE 152*  CALCIUM 8.8*   No results for input(s): LABPT, INR in the last 72 hours.  Exam: General - Patient is Alert and Oriented Extremity - Neurologically intact Neurovascular intact Sensation intact distally Dorsiflexion/Plantar flexion intact Dressing - dressing C/D/I Motor Function - intact, moving foot and toes well on exam.   Past Medical History:  Diagnosis Date   Atypical chest pain    Carotid bruit    History of blood transfusion    Hyperlipidemia    Hypertension    Iron deficiency anemia    Vertigo     Assessment/Plan: 1 Day Post-Op Procedure(s) (LRB): TOTAL HIP ARTHROPLASTY ANTERIOR APPROACH (Left) Active Problems:   Primary osteoarthritis of left hip  Estimated body mass index is  36.6 kg/m as calculated from the following:   Height as of this encounter: 5\' 2"  (1.575 m).   Weight as of this encounter: 90.8 kg. Advance diet Up with therapy D/C IV fluids  DVT Prophylaxis - Xarelto Weight bearing as tolerated. Continue therapy.  Plan is to go Home after hospital stay. Plan for discharge tomorrow. Her caregiver at home has multiple health issues, will require another day to continue working on mobility and stability.  , PA-C Orthopedic Surgery [phone] 03/30/2020, 7:45 AM

## 2020-03-31 LAB — BASIC METABOLIC PANEL
Anion gap: 7 (ref 5–15)
BUN: 21 mg/dL (ref 8–23)
CO2: 29 mmol/L (ref 22–32)
Calcium: 8.8 mg/dL — ABNORMAL LOW (ref 8.9–10.3)
Chloride: 101 mmol/L (ref 98–111)
Creatinine, Ser: 1.32 mg/dL — ABNORMAL HIGH (ref 0.44–1.00)
GFR, Estimated: 41 mL/min — ABNORMAL LOW (ref >60)
Glucose, Bld: 152 mg/dL — ABNORMAL HIGH (ref 70–99)
Potassium: 4.9 mmol/L (ref 3.5–5.1)
Sodium: 137 mmol/L (ref 135–145)

## 2020-03-31 LAB — CBC
HCT: 30.6 % — ABNORMAL LOW (ref 36.0–46.0)
Hemoglobin: 9.2 g/dL — ABNORMAL LOW (ref 12.0–15.0)
MCH: 27.9 pg (ref 26.0–34.0)
MCHC: 30.1 g/dL (ref 30.0–36.0)
MCV: 92.7 fL (ref 80.0–100.0)
Platelets: 241 10*3/uL (ref 150–400)
RBC: 3.3 MIL/uL — ABNORMAL LOW (ref 3.87–5.11)
RDW: 12.8 % (ref 11.5–15.5)
WBC: 15.1 10*3/uL — ABNORMAL HIGH (ref 4.0–10.5)
nRBC: 0 % (ref 0.0–0.2)

## 2020-03-31 MED ORDER — RIVAROXABAN 10 MG PO TABS: 10.0000 mg | ORAL_TABLET | Freq: Every day | ORAL | 0 refills | Status: DC

## 2020-03-31 MED ORDER — TRAMADOL HCL 50 MG PO TABS
50.0000 mg | ORAL_TABLET | Freq: Four times a day (QID) | ORAL | 0 refills | Status: DC | PRN
Start: 2020-03-31 — End: 2020-06-16

## 2020-03-31 MED ORDER — METHOCARBAMOL 500 MG PO TABS: 500.0000 mg | ORAL_TABLET | Freq: Four times a day (QID) | ORAL | 0 refills | Status: DC | PRN

## 2020-03-31 MED ORDER — HYDROCODONE-ACETAMINOPHEN 5-325 MG PO TABS: 1.0000 | ORAL_TABLET | Freq: Four times a day (QID) | ORAL | 0 refills | Status: DC | PRN

## 2020-03-31 NOTE — Progress Notes (Signed)
Physical Therapy Treatment Patient Details Name: Ashley Ritter MRN: [id number] DOB: 06/09/1941 Today's Date: 03/31/2020    History of Present Illness Pt is 79 yo female who is s/p L anterior THA on 03/29/20.  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    PT Comments    Pt with significant improvement in mobility despite pain this session. Pt able to ambulate 24" with RW in ~10 minutes with consistent foot clearance and L step progression and no physical assistance. Pt tolerates therapeutic exercises using gait belt to assist with hip flexion when needed with exercises, performing in tolerable ROM. Pt continues to be highly motivated with therapy. Pt has 5 steps to bedroom, unable to attempt this session. Pt will benefit from continued physical therapy in hospital and recommendations below to increase strength, balance, endurance for safe ADLs and gait.     Follow Up Recommendations  Follow surgeon's recommendation for DC plan and follow-up therapies;Supervision/Assistance - 24 hour;Home health PT     Equipment Recommendations  None recommended by PT    Recommendations for Other Services       Precautions / Restrictions Precautions Precautions: Fall;Anterior Hip Precaution Comments: anterior hip no precautions Restrictions Weight Bearing Restrictions: No LLE Weight Bearing: Weight bearing as tolerated    Mobility  Bed Mobility   General bed mobility comments: in chair upon arrival  Transfers Overall transfer level: Needs assistance Equipment used: Rolling walker (2 wheeled) Transfers: Sit to/from Stand Sit to Stand: Min assist    General transfer comment: min A to power up from seated in chair, able to upright trunk quicker this session without cues  Ambulation/Gait Ambulation/Gait assistance: Min guard Gait Distance (Feet): 24 Feet Assistive device: Rolling walker (2 wheeled) Gait Pattern/deviations: Step-to pattern;Decreased stride  length Gait velocity: decreased   General Gait Details: able to clear bil feet and progress LLE forward consistently and independently with decreased pain, only requiring 2 standing rest breaks this session   Stairs             Wheelchair Mobility    Modified Rankin (Stroke Patients Only)       Balance Overall balance assessment: Needs assistance Sitting-balance support: Feet supported;No upper extremity supported Sitting balance-Leahy Scale: Good Sitting balance - Comments: seated EOB   Standing balance support: During functional activity;Bilateral upper extremity supported Standing balance-Leahy Scale: Poor Standing balance comment: static fair without UE support, dynamic reliant on UE support       Cognition Arousal/Alertness: Awake/alert Behavior During Therapy: WFL for tasks assessed/performed Overall Cognitive Status: Within Functional Limits for tasks assessed         Exercises Total Joint Exercises Ankle Circles/Pumps: AROM;Strengthening;Both;20 reps;Seated Quad Sets: AROM;Strengthening;Left;20 reps;Seated Short Arc Quad: AROM;Strengthening;Left;Seated;15 reps Heel Slides: AROM;Strengthening;Left;Seated;10 reps (BUE using gait belt to assist) Hip ABduction/ADduction: AROM;Strengthening;Left;Seated;10 reps Long Arc Quad: AROM;Strengthening;Left;Seated;15 reps (pt using gait belt to support thigh)    General Comments        Pertinent Vitals/Pain Pain Assessment: 0-10 Pain Score: 6  Faces Pain Scale: Hurts whole lot Pain Location: L hip with activity Pain Descriptors / Indicators: Burning Pain Intervention(s): Limited activity within patient's tolerance;Monitored during session;Premedicated before session;Ice applied    Home Living                      Prior Function            PT Goals (current goals can now be found in the care plan section) Acute  Rehab PT Goals Patient Stated Goal: return to walking PT Goal Formulation: With  patient Time For Goal Achievement: 04/12/20 Potential to Achieve Goals: Good Progress towards PT goals: Progressing toward goals    Frequency    7X/week      PT Plan Current plan remains appropriate    Co-evaluation              AM-PAC PT "6 Clicks" Mobility   Outcome Measure  Help needed turning from your back to your side while in a flat bed without using bedrails?: A Lot Help needed moving from lying on your back to sitting on the side of a flat bed without using bedrails?: A Lot Help needed moving to and from a bed to a chair (including a wheelchair)?: A Little Help needed standing up from a chair using your arms (e.g., wheelchair or bedside chair)?: A Little Help needed to walk in hospital room?: A Little Help needed climbing 3-5 steps with a railing? : Total 6 Click Score: 14    End of Session Equipment Utilized During Treatment: Gait belt Activity Tolerance: Patient tolerated treatment well;Patient limited by pain Patient left: in chair;with call bell/phone within reach;with chair alarm set Nurse Communication: Mobility status PT Visit Diagnosis: Unsteadiness on feet (R26.81);Other abnormalities of gait and mobility (R26.89);Muscle weakness (generalized) (M62.81)     Time: 3875-6433 PT Time Calculation (min) (ACUTE ONLY): 34 min  Charges:  $Gait Training: 8-22 mins $Therapeutic Exercise: 8-22 mins                       Tori Deem Marmol PT, DPT 03/31/20, 3:46 PM

## 2020-03-31 NOTE — Progress Notes (Signed)
Physical Therapy Treatment Patient Details Name: Ashley Ritter MRN: [id number] DOB: Nov 27, 1940 Today's Date: 03/31/2020    History of Present Illness Pt is 79 yo female who is s/p L anterior THA on 03/29/20.  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    PT Comments    Educated pt on use of gait belt to assist LLE to EOB with supine to sit transfer with good carryover, requiring min A to upright trunk. Pt with improved pain tolerance and able to ambulate from seated EOB to door, though took ~25 minutes to ambulate 20 ft with RW. Cues for controlled breathing to avoid breath holding, sequencing step progression, RW placement and use of BUE on RW with steps throughout gait cycle. Pt motivated to mobilize, facial wincing noted and occasional moaning but pt continues to make progress forward with ambulation. Pt requests BSC during ambulation to void bladder, cued for extending LLE to avoid increased pain with STS transfer, able to complete pericare with SUPV. Still unsafe to attempt steps at this time, will attempt when appropriate. Pt tolerates remaining up in chair at EOS with ice, call bell and chair alarm on. Pt will benefit from continued physical therapy in hospital and recommendations below to increase strength, balance, endurance for safe ADLs and gait.    Follow Up Recommendations  Follow surgeon's recommendation for DC plan and follow-up therapies;Supervision/Assistance - 24 hour;Home health PT     Equipment Recommendations  None recommended by PT    Recommendations for Other Services       Precautions / Restrictions Precautions Precautions: Fall;Anterior Hip Precaution Comments: anterior hip no precautions Restrictions Weight Bearing Restrictions: No LLE Weight Bearing: Weight bearing as tolerated    Mobility  Bed Mobility Overal bed mobility: Needs Assistance Bed Mobility: Supine to Sit  Supine to sit: Min assist  General bed mobility  comments: educated pt on use of gait belt to assist LLE to EOB, pt pulls on therapist's hand to upright trunk, increased time due to pain  Transfers Overall transfer level: Needs assistance Equipment used: Rolling walker (2 wheeled) Transfers: Sit to/from Stand Sit to Stand: Min assist;+2 safety/equipment;From elevated surface  General transfer comment: min A to power up from seated EOB and BSC with BUE assisting, cues for glute/quad activation and upright posture, increasead time due to pain  Ambulation/Gait Ambulation/Gait assistance: Min guard;+2 safety/equipment Gait Distance (Feet): 20 Feet Assistive device: Rolling walker (2 wheeled) Gait Pattern/deviations: Step-to pattern;Decreased stride length Gait velocity: decreased   General Gait Details: significantly increased time with multiple standing rest breaks, pt able to ambluate to doorway with intermittent min A to progress LLE forward due to pain, constant encouragement and cues for BUE use on RW and glute/quad activation throughout gait cycle   Stairs             Wheelchair Mobility    Modified Rankin (Stroke Patients Only)       Balance Overall balance assessment: Needs assistance Sitting-balance support: Feet supported;No upper extremity supported Sitting balance-Leahy Scale: Good Sitting balance - Comments: seated EOB   Standing balance support: During functional activity;Bilateral upper extremity supported Standing balance-Leahy Scale: Poor Standing balance comment: reliant on UE support         Cognition Arousal/Alertness: Awake/alert Behavior During Therapy: WFL for tasks assessed/performed Overall Cognitive Status: Within Functional Limits for tasks assessed       Exercises      General Comments        Pertinent  Vitals/Pain Pain Assessment: Faces Faces Pain Scale: Hurts whole lot Pain Location: L hip with activity Pain Descriptors / Indicators: Burning;Shooting;Grimacing;Guarding Pain  Intervention(s): Limited activity within patient's tolerance;Monitored during session;Premedicated before session;Repositioned;Ice applied    Home Living                      Prior Function            PT Goals (current goals can now be found in the care plan section) Acute Rehab PT Goals Patient Stated Goal: return to walking PT Goal Formulation: With patient Time For Goal Achievement: 04/12/20 Potential to Achieve Goals: Good Progress towards PT goals: Progressing toward goals    Frequency    7X/week      PT Plan Current plan remains appropriate    Co-evaluation              AM-PAC PT "6 Clicks" Mobility   Outcome Measure  Help needed turning from your back to your side while in a flat bed without using bedrails?: A Lot Help needed moving from lying on your back to sitting on the side of a flat bed without using bedrails?: A Lot Help needed moving to and from a bed to a chair (including a wheelchair)?: A Little Help needed standing up from a chair using your arms (e.g., wheelchair or bedside chair)?: A Little Help needed to walk in hospital room?: A Little Help needed climbing 3-5 steps with a railing? : Total 6 Click Score: 14    End of Session Equipment Utilized During Treatment: Gait belt Activity Tolerance: Patient tolerated treatment well;Patient limited by pain Patient left: in chair;with call bell/phone within reach;with chair alarm set Nurse Communication: Mobility status;Other (comment) (purewick) PT Visit Diagnosis: Unsteadiness on feet (R26.81);Other abnormalities of gait and mobility (R26.89);Muscle weakness (generalized) (M62.81)     Time: 7169-6789 PT Time Calculation (min) (ACUTE ONLY): 42 min  Charges:  $Gait Training: 23-37 mins $Therapeutic Activity: 8-22 mins                      Tori Quindell Shere PT, DPT 03/31/20, 12:18 PM

## 2020-03-31 NOTE — Progress Notes (Signed)
   Subjective: 2 Days Post-Op Procedure(s) (LRB): TOTAL HIP ARTHROPLASTY ANTERIOR APPROACH (Left) Patient reports pain as mild.   Patient seen in rounds by Dr. Lequita Halt. Patient has had slower progression with physical therapy. Denies chest pain or SOB. No issues overnight. Plan is to go Home after hospital stay.  Objective: Vital signs in last 24 hours: Temp:  [98 F (36.7 C)-98.7 F (37.1 C)] 98.1 F (36.7 C) (10/22 0641) Pulse Rate:  [51-65] 65 (10/22 0641) Resp:  [14-17] 17 (10/22 0641) BP: (114-147)/(52-79) 147/79 (10/22 0641) SpO2:  [93 %-100 %] 100 % (10/22 0641)  Intake/Output from previous day:  Intake/Output Summary (Last 24 hours) at 03/31/2020 0700 Last data filed at 03/31/2020 0600 Gross per 24 hour  Intake 960 ml  Output 150 ml  Net 810 ml    Intake/Output this shift: Total I/O In: 480 [P.O.:480] Out: -   Labs: Recent Labs    03/30/20 0318 03/31/20 0303  HGB 9.7* 9.2*   Recent Labs    03/30/20 0318 03/31/20 0303  WBC 16.5* 15.1*  RBC 3.44* 3.30*  HCT 31.1* 30.6*  PLT 243 241   Recent Labs    03/30/20 0318 03/31/20 0303  NA 137 137  K 4.8 4.9  CL 102 101  CO2 28 29  BUN 14 21  CREATININE 0.96 1.32*  GLUCOSE 152* 152*  CALCIUM 8.8* 8.8*   No results for input(s): LABPT, INR in the last 72 hours.  Exam: General - Patient is Alert and Oriented Extremity - Neurologically intact Neurovascular intact Sensation intact distally Dorsiflexion/Plantar flexion intact Dressing/Incision - clean, dry, no drainage Motor Function - intact, moving foot and toes well on exam.   Past Medical History:  Diagnosis Date  . Atypical chest pain   . Carotid bruit   . History of blood transfusion   . Hyperlipidemia   . Hypertension   . Iron deficiency anemia   . Vertigo     Assessment/Plan: 2 Days Post-Op Procedure(s) (LRB): TOTAL HIP ARTHROPLASTY ANTERIOR APPROACH (Left) Active Problems:   Primary osteoarthritis of left hip  Estimated body  mass index is 36.6 kg/m as calculated from the following:   Height as of this encounter: 5\' 2"  (1.575 m).   Weight as of this encounter: 90.8 kg. Up with therapy  DVT Prophylaxis - Xarelto Weight-bearing as tolerated  Plan for discharge to home when meeting goals with physical therapy. Will order HHPT if warranted.   , PA-C Orthopedic Surgery [phone] 03/31/2020, 7:00 AM

## 2020-04-01 LAB — CBC
HCT: 31 % — ABNORMAL LOW (ref 36.0–46.0)
Hemoglobin: 9.5 g/dL — ABNORMAL LOW (ref 12.0–15.0)
MCH: 28.1 pg (ref 26.0–34.0)
MCHC: 30.6 g/dL (ref 30.0–36.0)
MCV: 91.7 fL (ref 80.0–100.0)
Platelets: 243 10*3/uL (ref 150–400)
RBC: 3.38 MIL/uL — ABNORMAL LOW (ref 3.87–5.11)
RDW: 12.8 % (ref 11.5–15.5)
WBC: 10.3 10*3/uL (ref 4.0–10.5)
nRBC: 0 % (ref 0.0–0.2)

## 2020-04-01 NOTE — Progress Notes (Signed)
Physical Therapy Treatment Patient Details Name: Ashley Ritter MRN: [id number] DOB: [birth year] Today's Date: 04/01/2020    History of Present Illness Pt is 79 yo female who is s/p L anterior THA on 03/29/20.  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    PT Comments    Progressing slowly. Pt is still only able to walk ~20 feet at a time before needing a seated rest break. She is still requiring Mod assist for bed mobility. Will continue to progress activity as tolerated. Hopefully will be able to attempt stair negotiation on tomorrow.    Follow Up Recommendations  Follow surgeon's recommendation for DC plan and follow-up therapies;Home health PT (strongly recommend 24/7 supervision/assist)     Equipment Recommendations  None recommended by PT    Recommendations for Other Services       Precautions / Restrictions Precautions Precautions: Fall Restrictions Weight Bearing Restrictions: No LLE Weight Bearing: Weight bearing as tolerated    Mobility  Bed Mobility Overal bed mobility: Needs Assistance Bed Mobility: Sit to Supine     Supine to sit: Mod assist;HOB elevated Sit to supine: Mod assist;HOB elevated   General bed mobility comments: Assist for bil LEs. Increased time.  Transfers Overall transfer level: Needs assistance Equipment used: Rolling walker (2 wheeled) Transfers: Sit to/from Stand Sit to Stand: Min assist         General transfer comment: Assist to rise, steady, control descent. Cues for safety, technique, hand placement. Increased time.  Ambulation/Gait Ambulation/Gait assistance: Min guard Gait Distance (Feet): 20 Feet (x2) Assistive device: Rolling walker (2 wheeled) Gait Pattern/deviations: Step-to pattern;Decreased stride length;Trunk flexed     General Gait Details: Slow gait speed. Intermittent difficulty advancing L LE. 1 seated rest break needed/taken. Pt fatigues easily.   Stairs              Wheelchair Mobility    Modified Rankin (Stroke Patients Only)       Balance Overall balance assessment: Needs assistance         Standing balance support: Bilateral upper extremity supported Standing balance-Leahy Scale: Poor                              Cognition Arousal/Alertness: Awake/alert Behavior During Therapy: WFL for tasks assessed/performed Overall Cognitive Status: Within Functional Limits for tasks assessed                                        Exercises Total Joint Exercises Ankle Circles/Pumps: AROM;Both;10 reps Quad Sets: AROM;Both;10 reps Heel Slides: AAROM;Left;10 reps Hip ABduction/ADduction: AAROM;Left;10 reps    General Comments        Pertinent Vitals/Pain Pain Assessment: 0-10 Pain Score: 5  Pain Location: L hip with activity Pain Descriptors / Indicators: Burning;Discomfort;Sore;Tightness Pain Intervention(s): Limited activity within patient's tolerance;Monitored during session;Repositioned    Home Living                      Prior Function            PT Goals (current goals can now be found in the care plan section) Progress towards PT goals: Progressing toward goals    Frequency    7X/week      PT Plan Current plan remains appropriate    Co-evaluation  AM-PAC PT "6 Clicks" Mobility   Outcome Measure  Help needed turning from your back to your side while in a flat bed without using bedrails?: A Lot Help needed moving from lying on your back to sitting on the side of a flat bed without using bedrails?: A Lot Help needed moving to and from a bed to a chair (including a wheelchair)?: A Little Help needed standing up from a chair using your arms (e.g., wheelchair or bedside chair)?: A Little Help needed to walk in hospital room?: A Little Help needed climbing 3-5 steps with a railing? : A Lot 6 Click Score: 15    End of Session Equipment Utilized During  Treatment: Gait belt Activity Tolerance: Patient limited by fatigue;Patient limited by pain Patient left: in bed;with call bell/phone within reach;with bed alarm set   PT Visit Diagnosis: Other abnormalities of gait and mobility (R26.89);Pain;Muscle weakness (generalized) (M62.81) Pain - Right/Left: Left Pain - part of body: Hip     Time: 1421-1450 PT Time Calculation (min) (ACUTE ONLY): 29 min  Charges:  $Gait Training: 23-37 mins $Therapeutic Exercise: 8-22 mins                        Faye Ramsay, PT Acute Rehabilitation  Office: [phone] Pager: [phone]

## 2020-04-01 NOTE — Plan of Care (Signed)
  Problem: Education: Goal: Knowledge of General Education information will improve Description: Including pain rating scale, medication(s)/side effects and non-pharmacologic comfort measures Outcome: Progressing   Problem: Pain Managment: Goal: General experience of comfort will improve Outcome: Progressing   

## 2020-04-01 NOTE — Plan of Care (Signed)
°  Problem: Education: Goal: Knowledge of General Education information will improve Description: Including pain rating scale, medication(s)/side effects and non-pharmacologic comfort measures 04/01/2020 0513 by Eliezer Bottom, RN Outcome: Progressing 04/01/2020 0443 by Eliezer Bottom, RN Outcome: Progressing   Problem: Pain Managment: Goal: General experience of comfort will improve 04/01/2020 0513 by Eliezer Bottom, RN Outcome: Progressing 04/01/2020 0443 by Eliezer Bottom, RN Outcome: Progressing

## 2020-04-01 NOTE — Progress Notes (Signed)
Subjective: 3 Days Post-Op Procedure(s) (LRB): TOTAL HIP ARTHROPLASTY ANTERIOR APPROACH (Left) Patient reports pain as mild.   +void Tolerating PO without nausea or vomiting.  +ambulation with PT Denies calf pain, CP, SOB  Objective: Vital signs in last 24 hours: Temp:  [97.9 F (36.6 C)-98.8 F (37.1 C)] 98.6 F (37 C) (10/23 0522) Pulse Rate:  [55-59] 59 (10/23 0522) Resp:  [15-18] 15 (10/23 0522) BP: (122-142)/(52-64) 142/64 (10/23 0522) SpO2:  [99 %-100 %] 99 % (10/23 0522)  Intake/Output from previous day: 10/22 0701 - 10/23 0700 In: 1200 [P.O.:1200] Out: 2850 [Urine:2850] Intake/Output this shift: No intake/output data recorded.  Recent Labs    03/30/20 0318 03/31/20 0303 04/01/20 0334  HGB 9.7* 9.2* 9.5*   Recent Labs    03/31/20 0303 04/01/20 0334  WBC 15.1* 10.3  RBC 3.30* 3.38*  HCT 30.6* 31.0*  PLT 241 243   Recent Labs    03/30/20 0318 03/31/20 0303  NA 137 137  K 4.8 4.9  CL 102 101  CO2 28 29  BUN 14 21  CREATININE 0.96 1.32*  GLUCOSE 152* 152*  CALCIUM 8.8* 8.8*   No results for input(s): LABPT, INR in the last 72 hours.  Neurologically intact ABD soft Neurovascular intact Sensation intact distally Intact pulses distally Dorsiflexion/Plantar flexion intact Incision: dressing C/D/I and no drainage No cellulitis present Compartment soft No calf tenderness, negative homan's bilaterally.   Assessment/Plan: 3 Days Post-Op Procedure(s) (LRB): TOTAL HIP ARTHROPLASTY ANTERIOR APPROACH (Left) Advance diet Up with therapy DVT ppx: Teds, SCDs. Ambulation, Xarelto Encourage IS WBAT  Plan D/C when meeting goals with PT will order [facility] if warranted.    Ashley Ritter Ashley Ritter 04/01/2020, 9:06 AM

## 2020-04-01 NOTE — Progress Notes (Signed)
Physical Therapy Treatment Patient Details Name: Ashley Ritter MRN: [id number] DOB: 10/26/1940 Today's Date: 04/01/2020    History of Present Illness Pt is 79 yo female who is s/p L anterior THA on 03/29/20.  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    PT Comments    Progressing very slowly. Pt continues to fatigue easily with activity. Pt c/o "no energy" on today. Moderate pain with activity. Unsure if pt will have assistance in home but I strongly recommend 24 hour supervision/assist. Will continue to follow and progress activity as tolerated.     Follow Up Recommendations  Follow surgeon's recommendation for DC plan and follow-up therapies;Home health PT (strongly recommend 24 hour supervision/assist)     Equipment Recommendations  None recommended by PT    Recommendations for Other Services       Precautions / Restrictions Precautions Precautions: Fall Restrictions Weight Bearing Restrictions: No LLE Weight Bearing: Weight bearing as tolerated    Mobility  Bed Mobility Overal bed mobility: Needs Assistance Bed Mobility: Supine to Sit     Supine to sit: Mod assist;HOB elevated     General bed mobility comments: Assist for trunk, legs, and to scoot to EOB. Increased time. Cues for safety, technique, hand placement.  Transfers Overall transfer level: Needs assistance Equipment used: Rolling walker (2 wheeled) Transfers: Sit to/from Stand Sit to Stand: Min assist         General transfer comment: Assist to rise, steady, control descent. Cues for safety, technique, hand placement. Increased time.  Ambulation/Gait Ambulation/Gait assistance: Min guard Gait Distance (Feet): 20 Feet (20'x1, 15'x 2) Assistive device: Rolling walker (2 wheeled) Gait Pattern/deviations: Step-to pattern;Decreased stride length;Trunk flexed     General Gait Details: Slow gait speed. Difficulty advancing L LE consistently. 2 seated rest breaks  needed/taken. Pt fatigues easily.   Stairs             Wheelchair Mobility    Modified Rankin (Stroke Patients Only)       Balance Overall balance assessment: Needs assistance         Standing balance support: Bilateral upper extremity supported Standing balance-Leahy Scale: Poor                              Cognition Arousal/Alertness: Awake/alert Behavior During Therapy: WFL for tasks assessed/performed Overall Cognitive Status: Within Functional Limits for tasks assessed                                        Exercises Total Joint Exercises Ankle Circles/Pumps: AROM;Both;10 reps Quad Sets: AROM;Both;10 reps Heel Slides: AAROM;Left;10 reps Hip ABduction/ADduction: AAROM;Left;10 reps    General Comments        Pertinent Vitals/Pain Pain Assessment: 0-10 Pain Score: 5  Pain Location: L hip with activity Pain Descriptors / Indicators: Discomfort;Sore Pain Intervention(s): Limited activity within patient's tolerance;Monitored during session;Ice applied    Home Living                      Prior Function            PT Goals (current goals can now be found in the care plan section) Progress towards PT goals: Progressing toward goals    Frequency    7X/week      PT Plan Current plan remains appropriate  Co-evaluation              AM-PAC PT "6 Clicks" Mobility   Outcome Measure  Help needed turning from your back to your side while in a flat bed without using bedrails?: A Lot Help needed moving from lying on your back to sitting on the side of a flat bed without using bedrails?: A Lot Help needed moving to and from a bed to a chair (including a wheelchair)?: A Little Help needed standing up from a chair using your arms (e.g., wheelchair or bedside chair)?: A Little Help needed to walk in hospital room?: A Little Help needed climbing 3-5 steps with a railing? : Total 6 Click Score: 14    End of  Session Equipment Utilized During Treatment: Gait belt Activity Tolerance: Patient limited by fatigue;Patient limited by pain Patient left: in chair;with call bell/phone within reach;with chair alarm set   PT Visit Diagnosis: Other abnormalities of gait and mobility (R26.89);Pain;Muscle weakness (generalized) (M62.81) Pain - Right/Left: Left Pain - part of body: Hip     Time: 6834-1962 PT Time Calculation (min) (ACUTE ONLY): 47 min  Charges:  $Gait Training: 23-37 mins $Therapeutic Exercise: 8-22 mins              Faye Ramsay, PT Acute Rehabilitation  Office: [phone] Pager: [phone]

## 2020-04-01 NOTE — Plan of Care (Signed)
  Problem: Clinical Measurements: Goal: Respiratory complications will improve Outcome: Progressing   Problem: Clinical Measurements: Goal: Cardiovascular complication will be avoided Outcome: Progressing   Problem: Elimination: Goal: Will not experience complications related to bowel motility Outcome: Progressing   Problem: Skin Integrity: Goal: Risk for impaired skin integrity will decrease Outcome: Progressing   Problem: Safety: Goal: Ability to remain free from injury will improve Outcome: Progressing   

## 2020-04-02 NOTE — Progress Notes (Signed)
   Subjective: 4 Days Post-Op Procedure(s) (LRB): TOTAL HIP ARTHROPLASTY ANTERIOR APPROACH (Left) Patient reports pain as mild.   Patient seen in rounds for Dr. Lequita Halt. Patient is well, and has had no acute complaints or problems other than discomfort in the left hip. No acute events overnight. Ambulated 20 feet with PT yesterday. Denies CP, SHOB, N/V. Voiding without difficulty, positive BM.  We will continue therapy today.   Objective: Vital signs in last 24 hours: Temp:  [98.2 F (36.8 C)-98.9 F (37.2 C)] 98.9 F (37.2 C) (10/24 0457) Pulse Rate:  [62-76] 62 (10/24 0457) Resp:  [16-18] 18 (10/24 0457) BP: (109-137)/(51-85) 125/51 (10/24 0457) SpO2:  [96 %-100 %] 96 % (10/24 0457)  Intake/Output from previous day:  Intake/Output Summary (Last 24 hours) at 04/02/2020 0817 Last data filed at 04/02/2020 0600 Gross per 24 hour  Intake 1260 ml  Output 1850 ml  Net -590 ml     Intake/Output this shift: No intake/output data recorded.  Labs: Recent Labs    03/31/20 0303 04/01/20 0334  HGB 9.2* 9.5*   Recent Labs    03/31/20 0303 04/01/20 0334  WBC 15.1* 10.3  RBC 3.30* 3.38*  HCT 30.6* 31.0*  PLT 241 243   Recent Labs    03/31/20 0303  NA 137  K 4.9  CL 101  CO2 29  BUN 21  CREATININE 1.32*  GLUCOSE 152*  CALCIUM 8.8*   No results for input(s): LABPT, INR in the last 72 hours.  Exam: General - Patient is Alert and Oriented Extremity - Neurologically intact Sensation intact distally Intact pulses distally Dorsiflexion/Plantar flexion intact Dressing - dressing C/D/I Motor Function - intact, moving foot and toes well on exam.   Past Medical History:  Diagnosis Date  . Atypical chest pain   . Carotid bruit   . History of blood transfusion   . Hyperlipidemia   . Hypertension   . Iron deficiency anemia   . Vertigo     Assessment/Plan: 4 Days Post-Op Procedure(s) (LRB): TOTAL HIP ARTHROPLASTY ANTERIOR APPROACH (Left) Active Problems:    Primary osteoarthritis of left hip  Estimated body mass index is 36.6 kg/m as calculated from the following:   Height as of this encounter: 5\' 2"  (1.575 m).   Weight as of this encounter: 90.8 kg. Advance diet Up with therapy  DVT Prophylaxis - Xarelto Weight bearing as tolerated.  Continues to progress slowly in terms of mobility. Up with therapy today. Plan for discharge to home when meeting goals with physical therapy. Will order HHPT if warranted.   , PA-C Orthopedic Surgery [phone] 04/02/2020, 8:17 AM

## 2020-04-02 NOTE — Plan of Care (Signed)
  Problem: Education: Goal: Knowledge of General Education information will improve Description: Including pain rating scale, medication(s)/side effects and non-pharmacologic comfort measures Outcome: Progressing   Problem: Health Behavior/Discharge Planning: Goal: Ability to manage health-related needs will improve Outcome: Progressing   Problem: Pain Managment: Goal: General experience of comfort will improve Outcome: Progressing   

## 2020-04-02 NOTE — Progress Notes (Addendum)
Physical Therapy Treatment Patient Details Name: Ashley Ritter MRN: [id number] DOB: [birth year] Today's Date: 04/02/2020    History of Present Illness Pt is 79 yo female who is s/p L anterior THA on 03/29/20.  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    PT Comments    Progressing with mobility. Pt was able to walk a short distance and practice stair negotiation this afternoon. Spoke with daughter who stated she cannot come into hospital for education/to practice with pt.   Recommend 2 more sessions on tomorrow in preparation for possible d/c home if ok with MD/PA.    Follow Up Recommendations  Follow surgeons recommendation for DC plan and follow-up therapies;Home health PT;Supervision/Assistance - 24 hour     Equipment Recommendations  None recommended by PT    Recommendations for Other Services       Precautions / Restrictions Precautions Precautions: Fall Restrictions Weight Bearing Restrictions: No LLE Weight Bearing: Weight bearing as tolerated    Mobility  Bed Mobility Overal bed mobility: Needs Assistance Bed Mobility: Supine to Sit;Sit to Supine     Supine to sit: Min assist;HOB elevated Sit to supine: Min assist;HOB elevated   General bed mobility comments: Pt utilized gait belt to help with L LE. Small amount of assist for L LE onto bed. Increased time and effort for pt. Cues for safety, technique.  Transfers Overall transfer level: Needs assistance Equipment used: Rolling walker (2 wheeled) Transfers: Sit to/from Stand Sit to Stand: Min guard         General transfer comment: Min guard for safety. Increased time. Cues for safety, technique, hand placement.  Ambulation/Gait Ambulation/Gait assistance: Min guard Gait Distance (Feet): 30 Feet Assistive device: Rolling walker (2 wheeled) Gait Pattern/deviations: Step-through pattern;Decreased stride length     General Gait Details: Slow gait speed. Walked to and from  from stairs.   Stairs Stairs: Yes Min Assist Stair Management: One rail Left;With cane Number of Stairs: 2 General stair comments: up and over portable stairs. vcs safety, technique, sequence. Small amount of assist to steady. Used cane + R handrail.   Wheelchair Mobility    Modified Rankin (Stroke Patients Only)       Balance Overall balance assessment: Needs assistance         Standing balance support: Bilateral upper extremity supported Standing balance-Leahy Scale: Fair                              Cognition Arousal/Alertness: Awake/alert Behavior During Therapy: WFL for tasks assessed/performed Overall Cognitive Status: Within Functional Limits for tasks assessed                                        Exercises Total Joint Exercises Ankle Circles/Pumps: AROM;Both;10 reps Quad Sets: AROM;Both;10 reps Heel Slides: AAROM;Left;10 reps Hip ABduction/ADduction: AAROM;Left;10 reps    General Comments        Pertinent Vitals/Pain Pain Assessment: 0-10 Pain Score: 5  Pain Location: L hip with activity Pain Descriptors / Indicators: Burning;Discomfort;Sore;Tightness Pain Intervention(s): Monitored during session;RN gave pain meds during session    Home Living                      Prior Function            PT Goals (current goals can  now be found in the care plan section) Progress towards PT goals: Progressing toward goals    Frequency    7X/week      PT Plan Current plan remains appropriate    Co-evaluation              AM-PAC PT "6 Clicks" Mobility   Outcome Measure  Help needed turning from your back to your side while in a flat bed without using bedrails?: A Little Help needed moving from lying on your back to sitting on the side of a flat bed without using bedrails?: A Little Help needed moving to and from a bed to a chair (including a wheelchair)?: A Little Help needed standing up from a chair  using your arms (e.g., wheelchair or bedside chair)?: A Little Help needed to walk in hospital room?: A Little Help needed climbing 3-5 steps with a railing? : A Little 6 Click Score: 18    End of Session Equipment Utilized During Treatment: Gait belt Activity Tolerance: Patient tolerated treatment well Patient left: in bed;with call bell/phone within reach;with bed alarm set   PT Visit Diagnosis: Other abnormalities of gait and mobility (R26.89);Pain;Muscle weakness (generalized) (M62.81) Pain - Right/Left: Left Pain - part of body: Hip     Time: 1025-8527 PT Time Calculation (min) (ACUTE ONLY): 38 min  Charges:  $Gait Training: 38-52 mins $Therapeutic Exercise: 8-22 mins                         Faye Ramsay, PT Acute Rehabilitation  Office: [phone] Pager: [phone]

## 2020-04-02 NOTE — Progress Notes (Signed)
Physical Therapy Treatment Patient Details Name: Ashley Ritter MRN: [id number] DOB: Mar 06, 1941 Today's Date: 04/02/2020    History of Present Illness Pt is 79 yo female who is s/p L anterior THA on 03/29/20.  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    PT Comments    Progressing slowly.    Follow Up Recommendations  Follow surgeon's recommendation for DC plan and follow-up therapies;Home health PT (strongly recommend 24 hour supervision/assist)     Equipment Recommendations  None recommended by PT    Recommendations for Other Services       Precautions / Restrictions Precautions Precautions: Fall Restrictions Weight Bearing Restrictions: No LLE Weight Bearing: Weight bearing as tolerated    Mobility  Bed Mobility Overal bed mobility: Needs Assistance Bed Mobility: Supine to Sit     Supine to sit: Min assist;HOB elevated     General bed mobility comments: Assist for trunk. Increased time and effort for pt. Cues for safety, technique.  Transfers Overall transfer level: Needs assistance Equipment used: Rolling walker (2 wheeled) Transfers: Sit to/from Stand Sit to Stand: Min guard         General transfer comment: Min guard for safety. Increased time. Cues for safety, technique, hand placement.  Ambulation/Gait Ambulation/Gait assistance: Min guard Gait Distance (Feet): 45 Feet (x2) Assistive device: Rolling walker (2 wheeled) Gait Pattern/deviations: Step-through pattern;Step-to pattern;Decreased stride length;Trunk flexed     General Gait Details: Slow gait speed. 1 seated rest break needed/taken. Pt fatigues easily-requires chair follow.   Stairs             Wheelchair Mobility    Modified Rankin (Stroke Patients Only)       Balance Overall balance assessment: Needs assistance         Standing balance support: Bilateral upper extremity supported Standing balance-Leahy Scale: Poor                               Cognition Arousal/Alertness: Awake/alert Behavior During Therapy: WFL for tasks assessed/performed Overall Cognitive Status: Within Functional Limits for tasks assessed                                        Exercises Total Joint Exercises Ankle Circles/Pumps: AROM;Both;10 reps Quad Sets: AROM;Both;10 reps Heel Slides: AAROM;Left;10 reps Hip ABduction/ADduction: AAROM;Left;10 reps    General Comments        Pertinent Vitals/Pain Pain Assessment: 0-10 Pain Score: 5  Pain Location: L hip with activity Pain Descriptors / Indicators: Burning;Discomfort;Sore;Tightness Pain Intervention(s): Limited activity within patient's tolerance;Monitored during session;Repositioned    Home Living                      Prior Function            PT Goals (current goals can now be found in the care plan section) Progress towards PT goals: Progressing toward goals    Frequency    7X/week      PT Plan Current plan remains appropriate    Co-evaluation              AM-PAC PT "6 Clicks" Mobility   Outcome Measure  Help needed turning from your back to your side while in a flat bed without using bedrails?: A Little Help needed moving from lying on your back  to sitting on the side of a flat bed without using bedrails?: A Little Help needed moving to and from a bed to a chair (including a wheelchair)?: A Little Help needed standing up from a chair using your arms (e.g., wheelchair or bedside chair)?: A Little Help needed to walk in hospital room?: A Little Help needed climbing 3-5 steps with a railing? : A Lot 6 Click Score: 17    End of Session Equipment Utilized During Treatment: Gait belt Activity Tolerance: Patient limited by fatigue;Patient limited by pain Patient left: in chair;with call bell/phone within reach;with chair alarm set   PT Visit Diagnosis: Other abnormalities of gait and mobility (R26.89);Pain;Muscle weakness  (generalized) (M62.81) Pain - Right/Left: Left Pain - part of body: Hip     Time: 2423-5361 PT Time Calculation (min) (ACUTE ONLY): 48 min  Charges:  $Gait Training: 23-37 mins $Therapeutic Exercise: 8-22 mins                        Faye Ramsay, PT Acute Rehabilitation  Office: [phone] Pager: [phone]

## 2020-04-03 NOTE — Progress Notes (Signed)
   Subjective: 5 Days Post-Op Procedure(s) (LRB): TOTAL HIP ARTHROPLASTY ANTERIOR APPROACH (Left) Patient reports pain as mild.   Patient seen in rounds for Dr. Lequita Halt. Patient is well, and has had no acute complaints or problems. Denies chest pain or SOB. No issues over the weekend. Plan is to go Home after hospital stay.  Objective: Vital signs in last 24 hours: Temp:  [98.2 F (36.8 C)-98.8 F (37.1 C)] 98.2 F (36.8 C) (10/25 0547) Pulse Rate:  [67-76] 67 (10/25 0547) Resp:  [16-17] 16 (10/25 0547) BP: (133-143)/(50-90) 133/50 (10/25 0547) SpO2:  [95 %-98 %] 95 % (10/25 0547)  Intake/Output from previous day:  Intake/Output Summary (Last 24 hours) at 04/03/2020 0806 Last data filed at 04/03/2020 0736 Gross per 24 hour  Intake 1800 ml  Output 1450 ml  Net 350 ml    Intake/Output this shift: Total I/O In: -  Out: 150 [Urine:150]  Labs: Recent Labs    04/01/20 0334  HGB 9.5*   Recent Labs    04/01/20 0334  WBC 10.3  RBC 3.38*  HCT 31.0*  PLT 243   No results for input(s): NA, K, CL, CO2, BUN, CREATININE, GLUCOSE, CALCIUM in the last 72 hours. No results for input(s): LABPT, INR in the last 72 hours.  Exam: General - Patient is Alert and Oriented Extremity - Neurologically intact Neurovascular intact Sensation intact distally Dorsiflexion/Plantar flexion intact Dressing/Incision - clean, dry, no drainage Motor Function - intact, moving foot and toes well on exam.   Past Medical History:  Diagnosis Date  . Atypical chest pain   . Carotid bruit   . History of blood transfusion   . Hyperlipidemia   . Hypertension   . Iron deficiency anemia   . Vertigo     Assessment/Plan: 5 Days Post-Op Procedure(s) (LRB): TOTAL HIP ARTHROPLASTY ANTERIOR APPROACH (Left) Active Problems:   Primary osteoarthritis of left hip  Estimated body mass index is 36.6 kg/m as calculated from the following:   Height as of this encounter: 5\' 2"  (1.575 m).   Weight as  of this encounter: 90.8 kg. Up with therapy  DVT Prophylaxis - Xarelto Weight-bearing as tolerated  Discharge home with HHPT. Arranging for family member to assist her once they get off work. Follow-up in the office in 2 weeks.   , PA-C Orthopedic Surgery [phone] 04/03/2020, 8:06 AM

## 2020-04-03 NOTE — TOC Transition Note (Signed)
Transition of Care [facility]) - CM/SW Discharge Note   Patient Details  Name: Ashley Ritter MRN: [id number] Date of Birth: December 11, 1940  Transition of Care [facility] CM/SW Contact:  Amada Jupiter, LCSW Phone Number: 04/03/2020, 11:09 AM   Clinical Narrative:    Pt cleared for dc today.  Has all needed DME.  HHPT orders placed and pt requests [facility] - referral made.  No further TOC needs.   Final next level of care: Home w Home Health Services Barriers to Discharge: Barriers Resolved   Patient Goals and CMS Choice Patient states their goals for this hospitalization and ongoing recovery are:: return home CMS Medicare.gov Compare Post Acute Care list provided to:: Patient    Discharge Placement                       Discharge Plan and Services                DME Arranged: N/A DME Agency: NA       HH Arranged: PT HH Agency: [facility] Date HH Agency Contacted: 04/03/20 Time HH Agency Contacted: 1109 Representative spoke with at [facility] Agency: [place]  Social Determinants of Health (SDOH) Interventions     Readmission Risk Interventions No flowsheet data found.

## 2020-04-03 NOTE — Care Management Important Message (Signed)
Important Message  Patient Details  IM Letter given to the patient Name: Melania Kirks MRN: [id number] Date of Birth: 05/20/1941   Medicare Important Message Given:  Yes     Caren Macadam 04/03/2020, 1:33 PM

## 2020-04-03 NOTE — Progress Notes (Signed)
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    PT Comments    POD # 5  Assisted OOB to amb to stairs.  General transfer comment: Min guard for safety. Increased time. Cues for safety, technique, hand placement.  Also assisted with BSC transfer.  Practiced stairs. Pt will need another PT session   Follow Up Recommendations  Follow surgeon's recommendation for DC plan and follow-up therapies;Home health PT;Supervision/Assistance - 24 hour     Equipment Recommendations  None recommended by PT    Recommendations for Other Services       Precautions / Restrictions Precautions Precaution Comments: anterior hip no precautions Restrictions Weight Bearing Restrictions: No LLE Weight Bearing: Weight bearing as tolerated    Mobility  Bed Mobility Overal bed mobility: Needs Assistance Bed Mobility: Supine to Sit     Supine to sit: Supervision;Min guard     General bed mobility comments: increased time and self able using gait belt as leg strap  Transfers Overall transfer level: Needs assistance Equipment used: Rolling walker (2 wheeled) Transfers: Sit to/from [employer] Sit to Stand: Supervision Stand pivot transfers: Supervision;Min guard       General transfer comment: Min guard for safety. Increased time. Cues for safety, technique, hand placement.  Also assisted with BSC transfer.  Ambulation/Gait Ambulation/Gait assistance: Supervision;Min guard Gait Distance (Feet): 28 Feet Assistive device: Rolling walker (2 wheeled) Gait Pattern/deviations: Step-through pattern;Decreased stride length Gait velocity: decreased   General Gait Details: Slow gait speed. Walked to and from from stairs.   Stairs       Number  of Stairs: 2 General stair comments: up and over portable stairs. vcs safety, technique, sequence. Small amount of assist to steady. Used cane + R handrail.   Wheelchair Mobility    Modified Rankin (Stroke Patients Only)       Balance                                            Cognition Arousal/Alertness: Awake/alert Behavior During Therapy: WFL for tasks assessed/performed Overall Cognitive Status: Within Functional Limits for tasks assessed                                 General Comments: AxO x 3 pleasant      Exercises      General Comments        Pertinent Vitals/Pain Pain Assessment: 0-10 Pain Score: 3  Pain Location: L hip with activity Pain Descriptors / Indicators: Burning;Discomfort;Sore;Tightness Pain Intervention(s): Monitored during session;Premedicated before session;Repositioned;Ice applied    Home Living                      Prior Function            PT Goals (current goals can now be found in the care plan section) Progress towards PT goals: Progressing toward goals    Frequency    7X/week      PT Plan Current plan remains appropriate    Co-evaluation  AM-PAC PT "6 Clicks" Mobility   Outcome Measure  Help needed turning from your back to your side while in a flat bed without using bedrails?: A Little Help needed moving from lying on your back to sitting on the side of a flat bed without using bedrails?: A Little Help needed moving to and from a bed to a chair (including a wheelchair)?: A Little Help needed standing up from a chair using your arms (e.g., wheelchair or bedside chair)?: A Little Help needed to walk in hospital room?: A Little Help needed climbing 3-5 steps with a railing? : A Little 6 Click Score: 18    End of Session Equipment Utilized During Treatment: Gait belt Activity Tolerance: Patient tolerated treatment well Patient left: in chair;with bed alarm  set Nurse Communication: Mobility status PT Visit Diagnosis: Other abnormalities of gait and mobility (R26.89);Pain;Muscle weakness (generalized) (M62.81) Pain - Right/Left: Left Pain - part of body: Hip     Time: 4696-2952 PT Time Calculation (min) (ACUTE ONLY): 35 min  Charges:  $Gait Training: 8-22 mins $Therapeutic Activity: 8-22 mins                     Felecia Shelling  PTA Acute  Rehabilitation Services Pager      [phone] Office      [phone]

## 2020-04-03 NOTE — Progress Notes (Signed)
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    PT Comments    POD # 5 pm session Assisted with amb a greater distance and repeat stair training. General stair comments: up and over portable stairs. vcs safety, technique, sequence. Small amount of assist to steady. Used cane + R handrail. Addressed all mobility questions, discussed appropriate activity, educated on use of ICE.  Pt ready for D/C to home.    Follow Up Recommendations  Follow surgeon's recommendation for DC plan and follow-up therapies;Home health PT;Supervision/Assistance - 24 hour     Equipment Recommendations  None recommended by PT    Recommendations for Other Services       Precautions / Restrictions Precautions Precautions: Fall Precaution Comments: anterior hip no precautions Restrictions Weight Bearing Restrictions: No LLE Weight Bearing: Weight bearing as tolerated    Mobility  Bed Mobility Overal bed mobility: Needs Assistance Bed Mobility: Sit to Supine     Supine to sit: Supervision;Min guard Sit to supine: Mod assist   General bed mobility comments: assisted back to bed with increased support L LE  Transfers Overall transfer level: Needs assistance Equipment used: Rolling walker (2 wheeled) Transfers: Sit to/from [employer] Sit to Stand: Supervision Stand pivot transfers: Supervision       General transfer comment: no VC's needed this session with good hand placement  Ambulation/Gait Ambulation/Gait assistance: Supervision Gait Distance (Feet): 60 Feet Assistive device: Rolling walker (2 wheeled) Gait Pattern/deviations: Step-through pattern;Decreased stride length Gait velocity: decreased   General Gait Details: slow but steady  and tolerated an increased distance   Stairs       Number of Stairs: 2 General stair comments: up and over portable stairs. vcs safety, technique, sequence. Small amount of assist to steady. Used cane + R handrail.   Wheelchair Mobility    Modified Rankin (Stroke Patients Only)       Balance             Standing balance-Leahy Scale: Fair                              Cognition Arousal/Alertness: Awake/alert Behavior During Therapy: WFL for tasks assessed/performed Overall Cognitive Status: Within Functional Limits for tasks assessed                                 General Comments: AxO x 3 pleasant      Exercises      General Comments        Pertinent Vitals/Pain Pain Assessment: 0-10 Pain Score: 4  Pain Location: L hip with activity Pain Descriptors / Indicators: Burning;Discomfort;Sore;Tightness Pain Intervention(s): Monitored during session;Repositioned    Home Living                      Prior Function            PT Goals (current goals can now be found in the care plan section) Progress towards PT goals: Progressing toward goals    Frequency    7X/week      PT Plan Current plan remains appropriate    Co-evaluation  AM-PAC PT "6 Clicks" Mobility   Outcome Measure  Help needed turning from your back to your side while in a flat bed without using bedrails?: A Little Help needed moving from lying on your back to sitting on the side of a flat bed without using bedrails?: A Little Help needed moving to and from a bed to a chair (including a wheelchair)?: A Little Help needed standing up from a chair using your arms (e.g., wheelchair or bedside chair)?: A Little Help needed to walk in hospital room?: A Little Help needed climbing 3-5 steps with a railing? : A Lot 6 Click Score: 17    End of Session Equipment Utilized During Treatment: Gait belt Activity Tolerance: Patient tolerated  treatment well Patient left: in bed;with call bell/phone within reach;with bed alarm set Nurse Communication: Mobility status;Other (comment) (pt ready for D/C to home) PT Visit Diagnosis: Other abnormalities of gait and mobility (R26.89);Pain;Muscle weakness (generalized) (M62.81) Pain - Right/Left: Left Pain - part of body: Hip     Time: 1400-1430 PT Time Calculation (min) (ACUTE ONLY): 30 min  Charges:  $Gait Training: 23-37 mins                      Felecia Shelling  PTA Acute  Rehabilitation Services Pager      [phone] Office      [phone]

## 2020-04-05 DIAGNOSIS — Z96643 Presence of artificial hip joint, bilateral: Secondary | ICD-10-CM | POA: Diagnosis not present

## 2020-04-05 DIAGNOSIS — Z9181 History of falling: Secondary | ICD-10-CM | POA: Diagnosis not present

## 2020-04-05 DIAGNOSIS — Z4789 Encounter for other orthopedic aftercare: Secondary | ICD-10-CM | POA: Diagnosis not present

## 2020-04-05 DIAGNOSIS — Z471 Aftercare following joint replacement surgery: Secondary | ICD-10-CM | POA: Diagnosis not present

## 2020-04-05 DIAGNOSIS — I739 Peripheral vascular disease, unspecified: Secondary | ICD-10-CM | POA: Diagnosis not present

## 2020-04-05 DIAGNOSIS — D509 Iron deficiency anemia, unspecified: Secondary | ICD-10-CM | POA: Diagnosis not present

## 2020-04-05 DIAGNOSIS — I10 Essential (primary) hypertension: Secondary | ICD-10-CM | POA: Diagnosis not present

## 2020-04-05 DIAGNOSIS — Z7982 Long term (current) use of aspirin: Secondary | ICD-10-CM | POA: Diagnosis not present

## 2020-04-05 DIAGNOSIS — Z87891 Personal history of nicotine dependence: Secondary | ICD-10-CM | POA: Diagnosis not present

## 2020-04-05 DIAGNOSIS — E785 Hyperlipidemia, unspecified: Secondary | ICD-10-CM | POA: Diagnosis not present

## 2020-04-05 NOTE — Discharge Summary (Signed)
. Physician Discharge Summary   Patient ID: Ashley Ritter MRN: [id number]  DOB/AGE: 1941-03-28 79 y.o.  Admit date: 03/29/2020 Discharge date: 04/03/2020  Primary Diagnosis: Osteoarthritis, left hip   Admission Diagnoses:  Past Medical History:  Diagnosis Date  . Atypical chest pain   . Carotid bruit   . History of blood transfusion   . Hyperlipidemia   . Hypertension   . Iron deficiency anemia   . Vertigo    Discharge Diagnoses:   Active Problems:   Primary osteoarthritis of left hip  Estimated body mass index is 36.6 kg/m as calculated from the following:   Height as of this encounter:  (1.575 m).   Weight as of this encounter: 90.8 kg.  Procedure:  Procedure(s) (LRB): TOTAL HIP ARTHROPLASTY ANTERIOR APPROACH (Left)   Consults: None  HPI: Ashley Ritter is a 79 y.o. female who has advanced end-  stage arthritis of their Left  hip with progressively worsening pain and dysfunction.The patient has failed nonoperative management and presents for total hip arthroplasty.   Laboratory Data: Admission on 03/29/2020, Discharged on 04/03/2020  Component Date Value Ref Range Status  . WBC 03/30/2020 16.5* 4.0 - 10.5 K/uL Final  . RBC 03/30/2020 3.44* 3.87 - 5.11 MIL/uL Final  . Hemoglobin 03/30/2020 9.7* 12.0 - 15.0 g/dL Final  . HCT 56/21/3086 31.1* 36 - 46 % Final  . MCV 03/30/2020 90.4  80.0 - 100.0 fL Final  . MCH 03/30/2020 28.2  26.0 - 34.0 pg Final  . MCHC 03/30/2020 31.2  30.0 - 36.0 g/dL Final  . RDW 57/84/6962 12.8  11.5 - 15.5 % Final  . Platelets 03/30/2020 243  150 - 400 K/uL Final  . nRBC 03/30/2020 0.0  0.0 - 0.2 % Final   Performed at [facility], [address], [place], [place] [address]  . Sodium 03/30/2020 137  135 - 145 mmol/L Final  . Potassium 03/30/2020 4.8  3.5 - 5.1 mmol/L Final  . Chloride 03/30/2020 102  98 - 111 mmol/L Final  . CO2 03/30/2020 28  22 - 32 mmol/L Final  . Glucose, Bld  03/30/2020 152* 70 - 99 mg/dL Final   Glucose reference range applies only to samples taken after fasting for at least 8 hours.  . BUN 03/30/2020 14  8 - 23 mg/dL Final  . Creatinine, Ser 03/30/2020 0.96  0.44 - 1.00 mg/dL Final  . Calcium 13/24/4010 8.8* 8.9 - 10.3 mg/dL Final  . GFR, Estimated 03/30/2020 56* >60 mL/min Final  . Anion gap 03/30/2020 7  5 - 15 Final   Performed at [facility], [address], [place], [place] [address]  . WBC 03/31/2020 15.1* 4.0 - 10.5 K/uL Final  . RBC 03/31/2020 3.30* 3.87 - 5.11 MIL/uL Final  . Hemoglobin 03/31/2020 9.2* 12.0 - 15.0 g/dL Final  . HCT 66/44/0347 30.6* 36 - 46 % Final  . MCV 03/31/2020 92.7  80.0 - 100.0 fL Final  . MCH 03/31/2020 27.9  26.0 - 34.0 pg Final  . MCHC 03/31/2020 30.1  30.0 - 36.0 g/dL Final  . RDW 42/59/5638 12.8  11.5 - 15.5 % Final  . Platelets 03/31/2020 241  150 - 400 K/uL Final  . nRBC 03/31/2020 0.0  0.0 - 0.2 % Final   Performed at [facility], [address], [place], [place] [address]  . Sodium 03/31/2020 137  135 - 145 mmol/L Final  . Potassium 03/31/2020 4.9  3.5 -  5.1 mmol/L Final  . Chloride 03/31/2020 101  98 - 111 mmol/L Final  . CO2 03/31/2020 29  22 - 32 mmol/L Final  . Glucose, Bld 03/31/2020 152* 70 - 99 mg/dL Final   Glucose reference range applies only to samples taken after fasting for at least 8 hours.  . BUN 03/31/2020 21  8 - 23 mg/dL Final  . Creatinine, Ser 03/31/2020 1.32* 0.44 - 1.00 mg/dL Final  . Calcium 50/38/8828 8.8* 8.9 - 10.3 mg/dL Final  . GFR, Estimated 03/31/2020 41* >60 mL/min Final   Comment: (NOTE) Calculated using the CKD-EPI Creatinine Equation (2021)   . Anion gap 03/31/2020 7  5 - 15 Final   Performed at [facility], [address] [place], [place] [address]  . WBC 04/01/2020 10.3  4.0 - 10.5 K/uL Final  . RBC 04/01/2020 3.38* 3.87 - 5.11 MIL/uL Final  . Hemoglobin 04/01/2020 9.5* 12.0 - 15.0 g/dL Final  .  HCT 17/91/5056 31.0* 36 - 46 % Final  . MCV 04/01/2020 91.7  80.0 - 100.0 fL Final  . MCH 04/01/2020 28.1  26.0 - 34.0 pg Final  . MCHC 04/01/2020 30.6  30.0 - 36.0 g/dL Final  . RDW 97/94/8016 12.8  11.5 - 15.5 % Final  . Platelets 04/01/2020 243  150 - 400 K/uL Final  . nRBC 04/01/2020 0.0  0.0 - 0.2 % Final   Performed at [facility], [address], [place], [place] [address]  Hospital Outpatient Visit on 03/25/2020  Component Date Value Ref Range Status  . SARS Coronavirus 2 03/25/2020 NEGATIVE  NEGATIVE Final   Comment: (NOTE) SARS-CoV-2 target nucleic acids are NOT DETECTED.  The SARS-CoV-2 RNA is generally detectable in upper and lower respiratory specimens during the acute phase of infection. Negative results do not preclude SARS-CoV-2 infection, do not rule out co-infections with other pathogens, and should not be used as the sole basis for treatment or other patient management decisions. Negative results must be combined with clinical observations, patient history, and epidemiological information. The expected result is Negative.  Fact Sheet for Patients: HairSlick.no  Fact Sheet for Healthcare Providers: quierodirigir.com  This test is not yet approved or cleared by the [place] FDA and  has been authorized for detection and/or diagnosis of SARS-CoV-2 by FDA under an Emergency Use Authorization (EUA). This EUA will remain  in effect (meaning this test can be used) for the duration of the COVID-19 declaration under Se                          ction 564(b)(1) of the Act, 21 U.S.C. section 360bbb-3(b)(1), unless the authorization is terminated or revoked sooner.  Performed at [facility], [address], [place], [place] [address]   Hospital Outpatient Visit on 03/21/2020  Component Date Value Ref Range Status  . MRSA, PCR 03/21/2020 NEGATIVE  NEGATIVE Final  . Staphylococcus  aureus 03/21/2020 NEGATIVE  NEGATIVE Final   Comment: (NOTE) The Xpert SA Assay (FDA approved for NASAL specimens in patients 64 years of age and older), is one component of a comprehensive surveillance program. It is not intended to diagnose infection nor to guide or monitor treatment. Performed at [facility] [address], [place], [place] [address]   . aPTT 03/21/2020 32  24 - 36 seconds Final   Performed at [facility], [address] [place], [place] [address]  . WBC 03/21/2020  7.8  4.0 - 10.5 K/uL Final  . RBC 03/21/2020 4.02  3.87 - 5.11 MIL/uL Final  . Hemoglobin 03/21/2020 11.3* 12.0 - 15.0 g/dL Final  . HCT 40/34/7425 36.1  36 - 46 % Final  . MCV 03/21/2020 89.8  80.0 - 100.0 fL Final  . MCH 03/21/2020 28.1  26.0 - 34.0 pg Final  . MCHC 03/21/2020 31.3  30.0 - 36.0 g/dL Final  . RDW 95/63/8756 13.2  11.5 - 15.5 % Final  . Platelets 03/21/2020 310  150 - 400 K/uL Final  . nRBC 03/21/2020 0.0  0.0 - 0.2 % Final   Performed at [facility], [address] [place], [place] [address]  . Sodium 03/21/2020 140  135 - 145 mmol/L Final  . Potassium 03/21/2020 3.5  3.5 - 5.1 mmol/L Final  . Chloride 03/21/2020 104  98 - 111 mmol/L Final  . CO2 03/21/2020 28  22 - 32 mmol/L Final  . Glucose, Bld 03/21/2020 109* 70 - 99 mg/dL Final   Glucose reference range applies only to samples taken after fasting for at least 8 hours.  . BUN 03/21/2020 15  8 - 23 mg/dL Final  . Creatinine, Ser 03/21/2020 0.95  0.44 - 1.00 mg/dL Final  . Calcium 51/88/4166 9.3  8.9 - 10.3 mg/dL Final  . Total Protein 03/21/2020 7.6  6.5 - 8.1 g/dL Final  . Albumin 12/08/1599 4.2  3.5 - 5.0 g/dL Final  . AST 09/32/3557 15  15 - 41 U/L Final  . ALT 03/21/2020 10  0 - 44 U/L Final  . Alkaline Phosphatase 03/21/2020 61  38 - 126 U/L Final  . Total Bilirubin 03/21/2020 0.6  0.3 - 1.2 mg/dL Final  . GFR, Estimated 03/21/2020 57* >60 mL/min Final  . Anion  gap 03/21/2020 8  5 - 15 Final   Performed at [facility], [address] [place], [place] [address]  . Prothrombin Time 03/21/2020 12.4  11.4 - 15.2 seconds Final  . INR 03/21/2020 1.0  0.8 - 1.2 Final   Comment: (NOTE) INR goal varies based on device and disease states. Performed at [facility], [address], [place], [place] [address]   . ABO/RH(D) 03/21/2020 B POS   Final  . Antibody Screen 03/21/2020 NEG   Final  . Sample Expiration 03/21/2020 04/01/2020,2359   Final  . Extend sample reason 03/21/2020    Final                   Value:NO TRANSFUSIONS OR PREGNANCY IN THE PAST 3 MONTHS Performed at [facility], [address] [place], [place] [address]   Appointment on 03/16/2020  Component Date Value Ref Range Status  . Rest HR 03/16/2020 48  bpm Final  . Rest BP 03/16/2020 179/83  mmHg Final  . Peak HR 03/16/2020 90  bpm Final  . Peak BP 03/16/2020 164/80  mmHg Final  . SSS 03/16/2020 6   Final  . SRS 03/16/2020 4   Final  . SDS 03/16/2020 2   Final  . TID 03/16/2020 0.99   Final  . LV sys vol 03/16/2020 45  mL Final  . LV dias vol 03/16/2020 106  46 - 106 mL Final     X-Rays:DG Pelvis Portable  Result Date: 03/29/2020 CLINICAL DATA:  Status post left hip replacement EXAM: PORTABLE PELVIS 1-2 VIEWS COMPARISON:  Intraoperative films from earlier in the same day. FINDINGS: Left hip prosthesis is now  seen in satisfactory position. Previous right hip prosthesis is noted. No acute fracture is seen. No soft tissue abnormality is noted. IMPRESSION: Status post left hip replacement. Status post left hip replacement. Electronically Signed   By: Alcide Clever M.D.   On: 03/29/2020 15:13   DG C-Arm 1-60 Min-No Report  Result Date: 03/29/2020 CLINICAL DATA:  Left hip replacement. EXAM: OPERATIVE left HIP (WITH PELVIS IF PERFORMED) 8 VIEWS TECHNIQUE: Fluoroscopic spot image(s) were submitted for interpretation  post-operatively. Radiation exposure index: 1.0078 mGy. COMPARISON:  None. FINDINGS: Eight intraoperative fluoroscopic images were obtained of the left hip. These demonstrate the left femoral and acetabular components to be well situated. IMPRESSION: Status post left total hip arthroplasty. Electronically Signed   By: Lupita Raider M.D.   On: 03/29/2020 13:54   MYOCARDIAL PERFUSION IMAGING  Result Date: 03/16/2020  The left ventricular ejection fraction is normal (55-65%).  Nuclear stress EF: 57%.  There was no ST segment deviation noted during stress.  The study is normal.  This is a low risk study.    DG HIP OPERATIVE UNILAT W OR W/O PELVIS LEFT  Result Date: 03/29/2020 CLINICAL DATA:  Left hip replacement. EXAM: OPERATIVE left HIP (WITH PELVIS IF PERFORMED) 8 VIEWS TECHNIQUE: Fluoroscopic spot image(s) were submitted for interpretation post-operatively. Radiation exposure index: 1.0078 mGy. COMPARISON:  None. FINDINGS: Eight intraoperative fluoroscopic images were obtained of the left hip. These demonstrate the left femoral and acetabular components to be well situated. IMPRESSION: Status post left total hip arthroplasty. Electronically Signed   By: Lupita Raider M.D.   On: 03/29/2020 13:54    EKG: Orders placed or performed in visit on 07/13/19  . EKG 12-Lead     Hospital Course: Sukhdeep Cuadras Dorris Riehle is a 79 y.o. who was admitted to [facility]. They were brought to the operating room on 03/29/2020 and underwent Procedure(s): TOTAL HIP ARTHROPLASTY ANTERIOR APPROACH.  Patient tolerated the procedure well and was later transferred to the recovery room and then to the orthopaedic floor for postoperative care. They were given PO and IV analgesics for pain control following their surgery. They were given 24 hours of postoperative antibiotics of  Anti-infectives (From admission, onward)   Start     Dose/Rate Route Frequency Ordered Stop   03/29/20 1800  ceFAZolin (ANCEF)  IVPB 2g/100 mL premix        2 g 200 mL/hr over 30 Minutes Intravenous Every 6 hours 03/29/20 1535 03/30/20 0910   03/29/20 1630  vancomycin (VANCOCIN) IVPB 1000 mg/200 mL premix  Status:  Discontinued        1,000 mg 200 mL/hr over 60 Minutes Intravenous Every 12 hours 03/29/20 1530 03/29/20 1535   03/29/20 1115  ceFAZolin (ANCEF) IVPB 2g/100 mL premix        2 g 200 mL/hr over 30 Minutes Intravenous On call to O.R. 03/29/20 1105 03/29/20 1212   03/29/20 1107  ceFAZolin (ANCEF) 2-4 GM/100ML-% IVPB       Note to Pharmacy: Vevelyn Royals  : cabinet override      03/29/20 1107 03/29/20 1213     and started on DVT prophylaxis in the form of Xarelto.   PT and OT were ordered for total joint protocol. Discharge planning consulted to help with postop disposition and equipment needs. Patient had a good night on the evening of surgery. They started to get up OOB with therapy on POD #1. Continued to work with therapy into POD #2. She had slower progression  with therapy, prompting a longer stay in the hospital. Pt was seen during rounds on day five and was ready to go home pending progress with therapy. . Pt worked with therapy for 2 additional sessions and was meeting their goals. She was discharged to home later that day in stable condition.  Diet: Regular diet Activity: WBAT Follow-up: in 2 weeks Disposition: Home with HHPT Discharged Condition: stable   Discharge Instructions    Call MD / Call 911   Complete by: As directed    If you experience chest pain or shortness of breath, CALL 911 and be transported to the hospital emergency room.  If you develope a fever above 101 F, pus (white drainage) or increased drainage or redness at the wound, or calf pain, call your surgeon's office.   Change dressing   Complete by: As directed    You have an adhesive waterproof bandage over the incision. Leave this in place until your first follow-up appointment. Once you remove this you will not need to  place another bandage.   Constipation Prevention   Complete by: As directed    Drink plenty of fluids.  Prune juice may be helpful.  You may use a stool softener, such as Colace (over the counter) 100 mg twice a day.  Use MiraLax (over the counter) for constipation as needed.   Diet - low sodium heart healthy   Complete by: As directed    Do not sit on low chairs, stoools or toilet seats, as it may be difficult to get up from low surfaces   Complete by: As directed    Driving restrictions   Complete by: As directed    No driving for two weeks   TED hose   Complete by: As directed    Use stockings (TED hose) for three weeks on both leg(s).  You may remove them at night for sleeping.   Weight bearing as tolerated   Complete by: As directed      Allergies as of 04/03/2020      Reactions   Penicillins Itching   Tolerates ertapenem  20 years ago Tolerated Cephalosporin Date: 03/30/20.   Oxycodone Itching      Medication List    STOP taking these medications   aspirin EC 81 MG tablet   Garlique 400 MG Tbec Generic drug: Garlic   VITAMIN D (CHOLECALCIFEROL) PO     TAKE these medications   HYDROcodone-acetaminophen 5-325 MG tablet Commonly known as: NORCO/VICODIN Take 1-2 tablets by mouth every 6 (six) hours as needed for severe pain.   methocarbamol 500 MG tablet Commonly known as: ROBAXIN Take 1 tablet (500 mg total) by mouth every 6 (six) hours as needed for muscle spasms.   rivaroxaban 10 MG Tabs tablet Commonly known as: XARELTO Take 1 tablet (10 mg total) by mouth daily with breakfast for 19 days. Then resume one 81 mg aspirin once a day.   Tandem 162-115.2 MG Caps capsule Generic drug: ferrous fumarate-iron polysaccharide complex Take 1 capsule by mouth daily.   traMADol 50 MG tablet Commonly known as: ULTRAM Take 1-2 tablets (50-100 mg total) by mouth every 6 (six) hours as needed for moderate pain.   triamterene-hydrochlorothiazide 37.5-25 MG  tablet Commonly known as: MAXZIDE-25 Take 1 tablet by mouth daily.            Discharge Care Instructions  (From admission, onward)         Start     Ordered   03/31/20 0000  Weight bearing as tolerated        03/31/20 0738   03/31/20 0000  Change dressing       Comments: You have an adhesive waterproof bandage over the incision. Leave this in place until your first follow-up appointment. Once you remove this you will not need to place another bandage.   03/31/20 1610          Follow-up Information    Tommie Ard, PA-C. Go on 04/12/2020.   Specialty: Orthopedic Surgery Why: You are scheduled for a post-operative appointment on 04-12-20 at 2:00 pm.  Contact information: [address] [place] [address] [phone]        [facility] Follow up.   Specialty: Home Health Services Why: to provide home health physical therapy Contact information: [address] [place] [address] [phone]               Signed: Arther Abbott, PA-C Orthopedic Surgery 04/05/2020, 4:18 PM

## 2020-04-11 DIAGNOSIS — D509 Iron deficiency anemia, unspecified: Secondary | ICD-10-CM | POA: Diagnosis not present

## 2020-04-11 DIAGNOSIS — Z471 Aftercare following joint replacement surgery: Secondary | ICD-10-CM | POA: Diagnosis not present

## 2020-04-11 DIAGNOSIS — I10 Essential (primary) hypertension: Secondary | ICD-10-CM | POA: Diagnosis not present

## 2020-04-11 DIAGNOSIS — E785 Hyperlipidemia, unspecified: Secondary | ICD-10-CM | POA: Diagnosis not present

## 2020-04-11 DIAGNOSIS — Z96643 Presence of artificial hip joint, bilateral: Secondary | ICD-10-CM | POA: Diagnosis not present

## 2020-04-11 DIAGNOSIS — I739 Peripheral vascular disease, unspecified: Secondary | ICD-10-CM | POA: Diagnosis not present

## 2020-04-12 DIAGNOSIS — Z96642 Presence of left artificial hip joint: Secondary | ICD-10-CM | POA: Insufficient documentation

## 2020-04-12 HISTORY — DX: Presence of left artificial hip joint: Z96.642

## 2020-04-13 DIAGNOSIS — I739 Peripheral vascular disease, unspecified: Secondary | ICD-10-CM | POA: Diagnosis not present

## 2020-04-13 DIAGNOSIS — D509 Iron deficiency anemia, unspecified: Secondary | ICD-10-CM | POA: Diagnosis not present

## 2020-04-13 DIAGNOSIS — E785 Hyperlipidemia, unspecified: Secondary | ICD-10-CM | POA: Diagnosis not present

## 2020-04-13 DIAGNOSIS — Z471 Aftercare following joint replacement surgery: Secondary | ICD-10-CM | POA: Diagnosis not present

## 2020-04-13 DIAGNOSIS — Z96643 Presence of artificial hip joint, bilateral: Secondary | ICD-10-CM | POA: Diagnosis not present

## 2020-04-13 DIAGNOSIS — I10 Essential (primary) hypertension: Secondary | ICD-10-CM | POA: Diagnosis not present

## 2020-04-17 DIAGNOSIS — I739 Peripheral vascular disease, unspecified: Secondary | ICD-10-CM | POA: Diagnosis not present

## 2020-04-17 DIAGNOSIS — I10 Essential (primary) hypertension: Secondary | ICD-10-CM | POA: Diagnosis not present

## 2020-04-17 DIAGNOSIS — Z471 Aftercare following joint replacement surgery: Secondary | ICD-10-CM | POA: Diagnosis not present

## 2020-04-17 DIAGNOSIS — E785 Hyperlipidemia, unspecified: Secondary | ICD-10-CM | POA: Diagnosis not present

## 2020-04-17 DIAGNOSIS — D509 Iron deficiency anemia, unspecified: Secondary | ICD-10-CM | POA: Diagnosis not present

## 2020-04-17 DIAGNOSIS — Z96643 Presence of artificial hip joint, bilateral: Secondary | ICD-10-CM | POA: Diagnosis not present

## 2020-04-19 DIAGNOSIS — E785 Hyperlipidemia, unspecified: Secondary | ICD-10-CM | POA: Diagnosis not present

## 2020-04-19 DIAGNOSIS — D509 Iron deficiency anemia, unspecified: Secondary | ICD-10-CM | POA: Diagnosis not present

## 2020-04-19 DIAGNOSIS — Z471 Aftercare following joint replacement surgery: Secondary | ICD-10-CM | POA: Diagnosis not present

## 2020-04-19 DIAGNOSIS — I10 Essential (primary) hypertension: Secondary | ICD-10-CM | POA: Diagnosis not present

## 2020-04-19 DIAGNOSIS — Z96643 Presence of artificial hip joint, bilateral: Secondary | ICD-10-CM | POA: Diagnosis not present

## 2020-04-19 DIAGNOSIS — I739 Peripheral vascular disease, unspecified: Secondary | ICD-10-CM | POA: Diagnosis not present

## 2020-04-24 DIAGNOSIS — I10 Essential (primary) hypertension: Secondary | ICD-10-CM | POA: Diagnosis not present

## 2020-04-24 DIAGNOSIS — E785 Hyperlipidemia, unspecified: Secondary | ICD-10-CM | POA: Diagnosis not present

## 2020-04-24 DIAGNOSIS — I739 Peripheral vascular disease, unspecified: Secondary | ICD-10-CM | POA: Diagnosis not present

## 2020-04-24 DIAGNOSIS — D509 Iron deficiency anemia, unspecified: Secondary | ICD-10-CM | POA: Diagnosis not present

## 2020-04-24 DIAGNOSIS — Z471 Aftercare following joint replacement surgery: Secondary | ICD-10-CM | POA: Diagnosis not present

## 2020-04-24 DIAGNOSIS — Z96643 Presence of artificial hip joint, bilateral: Secondary | ICD-10-CM | POA: Diagnosis not present

## 2020-04-26 DIAGNOSIS — D509 Iron deficiency anemia, unspecified: Secondary | ICD-10-CM | POA: Diagnosis not present

## 2020-04-26 DIAGNOSIS — I10 Essential (primary) hypertension: Secondary | ICD-10-CM | POA: Diagnosis not present

## 2020-04-26 DIAGNOSIS — Z96643 Presence of artificial hip joint, bilateral: Secondary | ICD-10-CM | POA: Diagnosis not present

## 2020-04-26 DIAGNOSIS — I739 Peripheral vascular disease, unspecified: Secondary | ICD-10-CM | POA: Diagnosis not present

## 2020-04-26 DIAGNOSIS — Z471 Aftercare following joint replacement surgery: Secondary | ICD-10-CM | POA: Diagnosis not present

## 2020-04-26 DIAGNOSIS — E785 Hyperlipidemia, unspecified: Secondary | ICD-10-CM | POA: Diagnosis not present

## 2020-04-30 DIAGNOSIS — E785 Hyperlipidemia, unspecified: Secondary | ICD-10-CM | POA: Diagnosis not present

## 2020-04-30 DIAGNOSIS — Z96643 Presence of artificial hip joint, bilateral: Secondary | ICD-10-CM | POA: Diagnosis not present

## 2020-04-30 DIAGNOSIS — Z471 Aftercare following joint replacement surgery: Secondary | ICD-10-CM | POA: Diagnosis not present

## 2020-04-30 DIAGNOSIS — I10 Essential (primary) hypertension: Secondary | ICD-10-CM | POA: Diagnosis not present

## 2020-04-30 DIAGNOSIS — I739 Peripheral vascular disease, unspecified: Secondary | ICD-10-CM | POA: Diagnosis not present

## 2020-04-30 DIAGNOSIS — D509 Iron deficiency anemia, unspecified: Secondary | ICD-10-CM | POA: Diagnosis not present

## 2020-05-01 ENCOUNTER — Other Ambulatory Visit: Payer: Self-pay | Admitting: Oncology

## 2020-05-01 DIAGNOSIS — D500 Iron deficiency anemia secondary to blood loss (chronic): Secondary | ICD-10-CM

## 2020-05-01 NOTE — Progress Notes (Signed)
[facility]  [address] [place],  [place]  [address] [phone]  Clinic Day:  05/02/2020  Referring physician: Abigail Miyamoto,*   HISTORY OF PRESENT ILLNESS:  The patient is a 79 y.o. 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  In the past, IV Feraheme was effective in replenishing her iron stores and improving her hemoglobin.  She comes in today to reassess her anemia.  Since her last visit, the patient has been doing fine.  She denies having any overt forms of blood loss since her last visit which concern her for recurrent iron deficiency anemia.     PHYSICAL EXAM:  Blood pressure (!) 208/83, pulse 63, temperature 97.7 F (36.5 C), resp. rate 16, height 5\' 2"  (1.575 m), weight 207 lb 14.4 oz (94.3 kg), SpO2 100 %. Wt Readings from Last 3 Encounters:  05/02/20 207 lb 14.4 oz (94.3 kg)  03/29/20 200 lb 2 oz (90.8 kg)  03/21/20 200 lb 2 oz (90.8 kg)   Body mass index is 38.03 kg/m. Performance status (ECOG): 1 - Symptomatic but completely ambulatory Physical Exam Constitutional:      Appearance: Normal appearance. She is not ill-appearing.  HENT:     Mouth/Throat:     Mouth: Mucous membranes are moist.     Pharynx: Oropharynx is clear. No oropharyngeal exudate or posterior oropharyngeal erythema.  Cardiovascular:     Rate and Rhythm: Normal rate and regular rhythm.     Heart sounds: No murmur heard.  No friction rub. No gallop.   Pulmonary:     Effort: Pulmonary effort is normal. No respiratory distress.     Breath sounds: Normal breath sounds. No wheezing, rhonchi or rales.  Abdominal:     General: Bowel sounds are normal. There is no distension.     Palpations: Abdomen is soft. There is no mass.     Tenderness: There is no abdominal tenderness.  Musculoskeletal:        General: No swelling.     Right lower leg: No edema.     Left lower leg: No  edema.  Lymphadenopathy:     Cervical: No cervical adenopathy.     Upper Body:     Right upper body: No supraclavicular or axillary adenopathy.     Left upper body: No supraclavicular or axillary adenopathy.     Lower Body: No right inguinal adenopathy. No left inguinal adenopathy.  Skin:    General: Skin is warm.     Coloration: Skin is not jaundiced.     Findings: No lesion or rash.  Neurological:     General: No focal deficit present.     Mental Status: She is alert and oriented to person, place, and time. Mental status is at baseline.     Cranial Nerves: Cranial nerves are intact.  Psychiatric:        Mood and Affect: Mood normal.        Behavior: Behavior normal.        Thought Content: Thought content normal.     LABS:    Ref. Range 05/02/2020 00:00 05/02/2020 12:56  Sodium Latest Ref Range: 137 - 147  140   Potassium Latest Ref Range: 3.4 - 5.3  3.9   Chloride Latest Ref Range: 99 - 108  104   CO2 Latest Ref Range: 13 - 22  32 (A)   Glucose Unknown 269   BUN  Latest Ref Range: 4 - 21  13   Creatinine Latest Ref Range: 0.5 - 1.1  0.9   Calcium Latest Ref Range: 8.7 - 10.7  9.8   Alkaline Phosphatase Latest Ref Range: 25 - 125  88   Albumin Latest Ref Range: 3.5 - 5.0  4.2   AST Latest Ref Range: 13 - 35  24   ALT Latest Ref Range: 7 - 35  11   Bilirubin, Total Unknown 0.4   Iron Latest Ref Range: 28 - 170 ug/dL  96  UIBC Latest Units: ug/dL  329  TIBC Latest Ref Range: 250 - 450 ug/dL  924  Saturation Ratios Latest Ref Range: 10.4 - 31.8 %  29  Ferritin Latest Ref Range: 11 - 307 ng/mL  175  WBC Unknown 5.2   RBC Latest Ref Range: 3.87 - 5.11  3.63 (A)   Hemoglobin Latest Ref Range: 12.0 - 16.0  10.3 (A)   HCT Latest Ref Range: 36 - 46  31 (A)     Ref. Range 05/02/2020 00:00  Platelets Latest Ref Range: 150 - 399  287     ASSESSMENT & PLAN:   Assessment/Plan:  A 79 y.o. female with iron deficiency anemia.  When evaluating her labs, her iron parameters  remain ideal.  Although not ideal, her hemoglobin remains above 10.  Clinically, she appears to be doing okay.  As she is stable from a hematologic standpoint, I will see her back in 4 months for repeat clinical assessment.  The patient understands all the plans discussed today and is in agreement with them.      Wallis Vancott Kirby Funk, MD

## 2020-05-02 ENCOUNTER — Other Ambulatory Visit: Payer: Self-pay | Admitting: Oncology

## 2020-05-02 ENCOUNTER — Telehealth: Payer: Self-pay | Admitting: Oncology

## 2020-05-02 ENCOUNTER — Inpatient Hospital Stay (INDEPENDENT_AMBULATORY_CARE_PROVIDER_SITE_OTHER): Payer: Medicare Other | Admitting: Oncology

## 2020-05-02 ENCOUNTER — Encounter: Payer: Self-pay | Admitting: Oncology

## 2020-05-02 ENCOUNTER — Inpatient Hospital Stay: Payer: Medicare Other | Attending: Oncology

## 2020-05-02 ENCOUNTER — Other Ambulatory Visit: Payer: Self-pay

## 2020-05-02 ENCOUNTER — Other Ambulatory Visit: Payer: Self-pay | Admitting: Hematology and Oncology

## 2020-05-02 VITALS — BP 175/72 | HR 63 | Temp 97.7°F | Resp 16 | Ht 62.0 in | Wt 207.9 lb

## 2020-05-02 DIAGNOSIS — D500 Iron deficiency anemia secondary to blood loss (chronic): Secondary | ICD-10-CM

## 2020-05-02 DIAGNOSIS — Z0001 Encounter for general adult medical examination with abnormal findings: Secondary | ICD-10-CM | POA: Diagnosis not present

## 2020-05-02 DIAGNOSIS — D509 Iron deficiency anemia, unspecified: Secondary | ICD-10-CM | POA: Diagnosis not present

## 2020-05-02 DIAGNOSIS — Z96642 Presence of left artificial hip joint: Secondary | ICD-10-CM | POA: Diagnosis not present

## 2020-05-02 DIAGNOSIS — Z471 Aftercare following joint replacement surgery: Secondary | ICD-10-CM | POA: Diagnosis not present

## 2020-05-02 DIAGNOSIS — D649 Anemia, unspecified: Secondary | ICD-10-CM | POA: Diagnosis not present

## 2020-05-02 LAB — BASIC METABOLIC PANEL
BUN: 13 (ref 4–21)
CO2: 32 — AB (ref 13–22)
Chloride: 104 (ref 99–108)
Creatinine: 0.9 (ref 0.5–1.1)
Glucose: 269
Potassium: 3.9 (ref 3.4–5.3)
Sodium: 140 (ref 137–147)

## 2020-05-02 LAB — FERRITIN: Ferritin: 175 ng/mL (ref 11–307)

## 2020-05-02 LAB — IRON AND TIBC
Iron: 96 ug/dL (ref 28–170)
Saturation Ratios: 29 % (ref 10.4–31.8)
TIBC: 333 ug/dL (ref 250–450)
UIBC: 237 ug/dL

## 2020-05-02 LAB — CBC AND DIFFERENTIAL
HCT: 31 — AB (ref 36–46)
Hemoglobin: 10.3 — AB (ref 12.0–16.0)
Neutrophils Absolute: 2.96
Platelets: 287 (ref 150–399)
WBC: 5.2

## 2020-05-02 LAB — CBC: RBC: 3.63 — AB (ref 3.87–5.11)

## 2020-05-02 LAB — COMPREHENSIVE METABOLIC PANEL
Albumin: 4.2 (ref 3.5–5.0)
Calcium: 9.8 (ref 8.7–10.7)

## 2020-05-02 LAB — HEPATIC FUNCTION PANEL
ALT: 11 (ref 7–35)
AST: 24 (ref 13–35)
Alkaline Phosphatase: 88 (ref 25–125)
Bilirubin, Total: 0.4

## 2020-05-02 NOTE — Telephone Encounter (Signed)
Per 11/23 los next appt given to patient 

## 2020-05-05 DIAGNOSIS — I10 Essential (primary) hypertension: Secondary | ICD-10-CM | POA: Diagnosis not present

## 2020-05-05 DIAGNOSIS — Z9181 History of falling: Secondary | ICD-10-CM | POA: Diagnosis not present

## 2020-05-05 DIAGNOSIS — Z4789 Encounter for other orthopedic aftercare: Secondary | ICD-10-CM | POA: Diagnosis not present

## 2020-05-05 DIAGNOSIS — Z96643 Presence of artificial hip joint, bilateral: Secondary | ICD-10-CM | POA: Diagnosis not present

## 2020-05-05 DIAGNOSIS — Z7982 Long term (current) use of aspirin: Secondary | ICD-10-CM | POA: Diagnosis not present

## 2020-05-05 DIAGNOSIS — Z471 Aftercare following joint replacement surgery: Secondary | ICD-10-CM | POA: Diagnosis not present

## 2020-05-05 DIAGNOSIS — D509 Iron deficiency anemia, unspecified: Secondary | ICD-10-CM | POA: Diagnosis not present

## 2020-05-05 DIAGNOSIS — Z87891 Personal history of nicotine dependence: Secondary | ICD-10-CM | POA: Diagnosis not present

## 2020-05-05 DIAGNOSIS — E785 Hyperlipidemia, unspecified: Secondary | ICD-10-CM | POA: Diagnosis not present

## 2020-05-05 DIAGNOSIS — I739 Peripheral vascular disease, unspecified: Secondary | ICD-10-CM | POA: Diagnosis not present

## 2020-05-10 DIAGNOSIS — M170 Bilateral primary osteoarthritis of knee: Secondary | ICD-10-CM | POA: Diagnosis not present

## 2020-05-10 DIAGNOSIS — M21162 Varus deformity, not elsewhere classified, left knee: Secondary | ICD-10-CM | POA: Diagnosis not present

## 2020-05-10 DIAGNOSIS — M25361 Other instability, right knee: Secondary | ICD-10-CM | POA: Diagnosis not present

## 2020-05-10 DIAGNOSIS — M25562 Pain in left knee: Secondary | ICD-10-CM | POA: Diagnosis not present

## 2020-05-10 DIAGNOSIS — M21161 Varus deformity, not elsewhere classified, right knee: Secondary | ICD-10-CM | POA: Diagnosis not present

## 2020-05-10 DIAGNOSIS — M1712 Unilateral primary osteoarthritis, left knee: Secondary | ICD-10-CM | POA: Diagnosis not present

## 2020-05-10 DIAGNOSIS — M25561 Pain in right knee: Secondary | ICD-10-CM | POA: Diagnosis not present

## 2020-05-11 DIAGNOSIS — M1711 Unilateral primary osteoarthritis, right knee: Secondary | ICD-10-CM | POA: Diagnosis not present

## 2020-05-11 DIAGNOSIS — M25561 Pain in right knee: Secondary | ICD-10-CM | POA: Diagnosis not present

## 2020-05-19 DIAGNOSIS — M25562 Pain in left knee: Secondary | ICD-10-CM | POA: Diagnosis not present

## 2020-05-19 DIAGNOSIS — M1712 Unilateral primary osteoarthritis, left knee: Secondary | ICD-10-CM | POA: Diagnosis not present

## 2020-05-24 DIAGNOSIS — M1711 Unilateral primary osteoarthritis, right knee: Secondary | ICD-10-CM | POA: Diagnosis not present

## 2020-05-24 DIAGNOSIS — M1712 Unilateral primary osteoarthritis, left knee: Secondary | ICD-10-CM | POA: Diagnosis not present

## 2020-05-24 DIAGNOSIS — M25562 Pain in left knee: Secondary | ICD-10-CM | POA: Diagnosis not present

## 2020-05-24 DIAGNOSIS — M25561 Pain in right knee: Secondary | ICD-10-CM | POA: Diagnosis not present

## 2020-06-01 DIAGNOSIS — M25561 Pain in right knee: Secondary | ICD-10-CM | POA: Diagnosis not present

## 2020-06-01 DIAGNOSIS — M1711 Unilateral primary osteoarthritis, right knee: Secondary | ICD-10-CM | POA: Diagnosis not present

## 2020-06-16 ENCOUNTER — Other Ambulatory Visit: Payer: Self-pay | Admitting: Legal Medicine

## 2020-06-16 ENCOUNTER — Other Ambulatory Visit: Payer: Self-pay

## 2020-06-16 ENCOUNTER — Encounter: Payer: Self-pay | Admitting: Legal Medicine

## 2020-06-16 ENCOUNTER — Ambulatory Visit (INDEPENDENT_AMBULATORY_CARE_PROVIDER_SITE_OTHER): Payer: Medicare Other | Admitting: Legal Medicine

## 2020-06-16 VITALS — BP 100/60 | HR 56 | Temp 97.2°F | Resp 16 | Ht 60.0 in | Wt 199.0 lb

## 2020-06-16 DIAGNOSIS — E785 Hyperlipidemia, unspecified: Secondary | ICD-10-CM | POA: Diagnosis not present

## 2020-06-16 DIAGNOSIS — I10 Essential (primary) hypertension: Secondary | ICD-10-CM

## 2020-06-16 DIAGNOSIS — M1612 Unilateral primary osteoarthritis, left hip: Secondary | ICD-10-CM

## 2020-06-16 DIAGNOSIS — N3001 Acute cystitis with hematuria: Secondary | ICD-10-CM | POA: Diagnosis not present

## 2020-06-16 DIAGNOSIS — I739 Peripheral vascular disease, unspecified: Secondary | ICD-10-CM

## 2020-06-16 MED ORDER — METHOCARBAMOL 500 MG PO TABS: 500.0000 mg | ORAL_TABLET | Freq: Four times a day (QID) | ORAL | 3 refills | Status: DC | PRN

## 2020-06-16 NOTE — Progress Notes (Signed)
Subjective:  Patient ID: Ashley Ritter, female    DOB: 01/22/1941  Age: 80 y.o. MRN: [id number]  Chief Complaint  Patient presents with  . Hypertension  . Hyperlipidemia    HPI: chronic visit  Patient presents for follow up of hypertension.  Patient tolerating maxide well with side effects.  Patient was diagnosed with hypertension 2010 so has been treated for hypertension for 10 years.Patient is working on maintaining diet and exercise regimen and follows up as directed. Complication include none.  Patient presents with hyperlipidemia.  Compliance with treatment has been good; patient takes medicines as directed, maintains low cholesterol diet, follows up as directed, and maintains exercise regimen.  Patient is using diet without problems.  Current Outpatient Medications on File Prior to Visit  Medication Sig Dispense Refill  . ferrous fumarate-iron polysaccharide complex (TANDEM) 162-115.2 MG CAPS capsule Take 1 capsule by mouth daily.    Marland Kitchen triamterene-hydrochlorothiazide (MAXZIDE-25) 37.5-25 MG tablet Take 1 tablet by mouth daily. 90 tablet 3   Current Facility-Administered Medications on File Prior to Visit  Medication Dose Route Frequency Provider Last Rate Last Admin  . vancomycin (VANCOCIN) 1,000 mg in sodium chloride 0.9 % 500 mL IVPB  1,000 mg Intravenous Once Julien Girt, Alexzandrew L, PA-C       Past Medical History:  Diagnosis Date  . Atypical chest pain   . Carotid bruit   . History of blood transfusion   . Hyperlipidemia   . Hypertension   . Iron deficiency anemia   . Vertigo    Past Surgical History:  Procedure Laterality Date  . ABDOMINAL HYSTERECTOMY    . GASTRIC RESTRICTION SURGERY    . SHOULDER SURGERY    . TONSILLECTOMY    . 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 TOTAL HIP ARTHROPLASTY Left  03/29/2020   Procedure: TOTAL HIP ARTHROPLASTY ANTERIOR APPROACH;  Surgeon: Ollen Gross, MD;  Location: [facility];  Service: Orthopedics;  Laterality: Left;   . TUBAL LIGATION      Family History  Problem Relation Age of Onset  . Diabetes Mother   . Heart attack Father   . Heart disease Father   . Hyperlipidemia Brother   . Hypertension Brother    Social History   Socioeconomic History  . Marital status: Married    Spouse name: Not on file  . Number of children: Not on file  . Years of education: Not on file  . Highest education level: Not on file  Occupational History  . Not on file  Tobacco Use  . Smoking status: Former Smoker    Quit date: 1983    Years since quitting: 39.0  . Smokeless tobacco: Never Used  Vaping Use  . Vaping Use: Never used  Substance and Sexual Activity  . Alcohol use: No  . Drug use: No  . Sexual activity: Not Currently  Other Topics Concern  . Not on file  Social History Narrative  . Not on file   Social Determinants of Health   Financial Resource Strain: Not on file  Food Insecurity: Not on file  Transportation Needs: Not on file  Physical Activity: Not on file  Stress: Not on file  Social Connections: Not on file    Review of Systems  Constitutional: Negative for activity change, fatigue and fever.  HENT: Negative for congestion, sinus pressure and  sinus pain.   Eyes: Negative for visual disturbance.  Respiratory: Negative for cough, chest tightness and shortness of breath.   Cardiovascular: Negative for chest pain, palpitations and leg swelling.  Gastrointestinal: Negative for abdominal distention and abdominal pain.  Endocrine: Negative for polyuria.  Genitourinary: Negative for difficulty urinating, dyspareunia and urgency.  Musculoskeletal: Negative for arthralgias and back pain.  Skin: Negative.   Neurological: Negative for dizziness and weakness.  Psychiatric/Behavioral: Negative.      Objective:  BP 100/60    Pulse (!) 56   Temp (!) 97.2 F (36.2 C)   Resp 16   Ht 5' (1.524 m)   Wt 199 lb (90.3 kg)   BMI 38.86 kg/m   BP/Weight 06/16/2020 05/02/2020 04/03/2020  Systolic BP 100 175 142  Diastolic BP 60 72 63  Wt. (Lbs) 199 207.9 -  BMI 38.86 38.03 -    Physical Exam Vitals reviewed.  Constitutional:      Appearance: Normal appearance.  HENT:     Head: Normocephalic.     Right Ear: Tympanic membrane, ear canal and external ear normal.     Left Ear: Tympanic membrane, ear canal and external ear normal.     Mouth/Throat:     Mouth: Mucous membranes are moist.     Pharynx: Oropharynx is clear.  Eyes:     Extraocular Movements: Extraocular movements intact.     Conjunctiva/sclera: Conjunctivae normal.     Pupils: Pupils are equal, round, and reactive to light.  Cardiovascular:     Rate and Rhythm: Normal rate and regular rhythm.     Pulses:          Dorsalis pedis pulses are 1+ on the right side and 1+ on the left side.       Posterior tibial pulses are 1+ on the left side.     Heart sounds: Normal heart sounds. No murmur heard. No gallop.   Pulmonary:     Effort: Pulmonary effort is normal.     Breath sounds: Normal breath sounds.  Abdominal:     General: Abdomen is flat. Bowel sounds are normal. There is no distension.     Palpations: Abdomen is soft.     Tenderness: There is no abdominal tenderness.  Musculoskeletal:        General: Normal range of motion.     Cervical back: Normal range of motion and neck supple.  Skin:    General: Skin is warm.     Capillary Refill: Capillary refill takes less than 2 seconds.  Neurological:     General: No focal deficit present.     Mental Status: She is alert and oriented to person, place, and time. Mental status is at baseline.  Psychiatric:        Mood and Affect: Mood normal.        Thought Content: Thought content normal.        Judgment: Judgment normal.       Lab Results  Component Value Date   WBC 6.8 06/16/2020   HGB  11.3 06/16/2020   HCT 34.7 06/16/2020   PLT 335 06/16/2020   GLUCOSE 102 (H) 06/16/2020   CHOL 246 (H) 06/16/2020   TRIG 64 06/16/2020   HDL 79 06/16/2020   LDLCALC 156 (H) 06/16/2020   ALT 8 06/16/2020   AST 12 06/16/2020   NA 140 06/16/2020   K 4.5 06/16/2020   CL 100 06/16/2020   CREATININE 1.10 (H) 06/16/2020   BUN 21 06/16/2020  CO2 26 06/16/2020   TSH 0.782 06/16/2020   INR 1.0 03/21/2020      Assessment & Plan:   1. Essential hypertension - Comprehensive metabolic panel - CBC with Differential/Platelet An individual hypertension care plan was established and reinforced today.  The patient's status was assessed using clinical findings on exam and labs or diagnostic tests. The patient's success at meeting treatment goals on disease specific evidence-based guidelines and found to be well controlled. SELF MANAGEMENT: The patient and I together assessed ways to personally work towards obtaining the recommended goals. RECOMMENDATIONS: avoid decongestants found in common cold remedies, decrease consumption of alcohol, perform routine monitoring of BP with home BP cuff, exercise, reduction of dietary salt, take medicines as prescribed, try not to miss doses and quit smoking.  Regular exercise and maintaining a healthy weight is needed.  Stress reduction may help. A CLINICAL SUMMARY including written plan identify barriers to care unique to individual due to social or financial issues.  We attempt to mutually creat solutions for individual and family understanding.  2. Dyslipidemia - Lipid panel - TSH AN INDIVIDUAL CARE PLAN for hyperlipidemia/ cholesterol was established and reinforced today.  The patient's status was assessed using clinical findings on exam, lab and other diagnostic tests. The patient's disease status was assessed based on evidence-based guidelines and found to be well controlled. MEDICATIONS were reviewed. SELF MANAGEMENT GOALS have been discussed and patient's  success at attaining the goal of low cholesterol was assessed. RECOMMENDATION given include regular exercise 3 days a week and low cholesterol/low fat diet. CLINICAL SUMMARY including written plan to identify barriers unique to the patient due to social or economic  reasons was discussed.  3. Peripheral vascular disease ([place]) Patient has continue PVD but no claudication at present  4. Osteoarthritis of left hip, unspecified osteoarthritis type AN INDIVIDUAL CARE PLAN for hip arthritis was established and reinforced today.  The patient's status was assessed using clinical findings on exam, labs, and other diagnostic testing. Patient's success at meeting treatment goals based on disease specific evidence-bassed guidelines and found to be in fair control. RECOMMENDATIONS include maintaining present medicines and treatment.      Orders Placed This Encounter  Procedures  . Urine Culture  . Lipid panel  . Comprehensive metabolic panel  . TSH  . CBC with Differential/Platelet  . Cardiovascular Risk Assessment      I spent 20 minutes dedicated to the care of this patient on the date of this encounter to include face-to-face time with the patient, as well as: reviewed old records  Follow-up: Return in about 6 months (around 12/14/2020), or fasting.  An After Visit Summary was printed and given to the patient.  Reinaldo Meeker, MD [facility] [phone]

## 2020-06-17 LAB — CBC WITH DIFFERENTIAL/PLATELET
Basophils Absolute: 0.1 10*3/uL (ref 0.0–0.2)
Basos: 1 %
EOS (ABSOLUTE): 0.1 10*3/uL (ref 0.0–0.4)
Eos: 2 %
Hematocrit: 34.7 % (ref 34.0–46.6)
Hemoglobin: 11.3 g/dL (ref 11.1–15.9)
Immature Grans (Abs): 0 10*3/uL (ref 0.0–0.1)
Immature Granulocytes: 0 %
Lymphocytes Absolute: 2.2 10*3/uL (ref 0.7–3.1)
Lymphs: 33 %
MCH: 27.6 pg (ref 26.6–33.0)
MCHC: 32.6 g/dL (ref 31.5–35.7)
MCV: 85 fL (ref 79–97)
Monocytes Absolute: 0.6 10*3/uL (ref 0.1–0.9)
Monocytes: 8 %
Neutrophils Absolute: 3.8 10*3/uL (ref 1.4–7.0)
Neutrophils: 56 %
Platelets: 335 10*3/uL (ref 150–450)
RBC: 4.09 x10E6/uL (ref 3.77–5.28)
RDW: 12 % (ref 11.7–15.4)
WBC: 6.8 10*3/uL (ref 3.4–10.8)

## 2020-06-17 LAB — COMPREHENSIVE METABOLIC PANEL
ALT: 8 IU/L (ref 0–32)
AST: 12 IU/L (ref 0–40)
Albumin/Globulin Ratio: 1.6 (ref 1.2–2.2)
Albumin: 4.5 g/dL (ref 3.7–4.7)
Alkaline Phosphatase: 75 IU/L (ref 44–121)
BUN/Creatinine Ratio: 19 (ref 12–28)
BUN: 21 mg/dL (ref 8–27)
Bilirubin Total: 0.4 mg/dL (ref 0.0–1.2)
CO2: 26 mmol/L (ref 20–29)
Calcium: 9.8 mg/dL (ref 8.7–10.3)
Chloride: 100 mmol/L (ref 96–106)
Creatinine, Ser: 1.1 mg/dL — ABNORMAL HIGH (ref 0.57–1.00)
GFR calc Af Amer: 55 mL/min/{1.73_m2} — ABNORMAL LOW (ref >59)
GFR calc non Af Amer: 48 mL/min/{1.73_m2} — ABNORMAL LOW (ref >59)
Globulin, Total: 2.8 g/dL (ref 1.5–4.5)
Glucose: 102 mg/dL — ABNORMAL HIGH (ref 65–99)
Potassium: 4.5 mmol/L (ref 3.5–5.2)
Sodium: 140 mmol/L (ref 134–144)
Total Protein: 7.3 g/dL (ref 6.0–8.5)

## 2020-06-17 LAB — TSH: TSH: 0.782 u[IU]/mL (ref 0.450–4.500)

## 2020-06-17 LAB — LIPID PANEL
Chol/HDL Ratio: 3.1 ratio (ref 0.0–4.4)
Cholesterol, Total: 246 mg/dL — ABNORMAL HIGH (ref 100–199)
HDL: 79 mg/dL (ref >39)
LDL Chol Calc (NIH): 156 mg/dL — ABNORMAL HIGH (ref 0–99)
Triglycerides: 64 mg/dL (ref 0–149)
VLDL Cholesterol Cal: 11 mg/dL (ref 5–40)

## 2020-06-17 LAB — CARDIOVASCULAR RISK ASSESSMENT

## 2020-06-18 NOTE — Progress Notes (Signed)
Ldl cholesterol high 156 needs to be on statin to lower vascular risk, glucose 102, kidney tests remain stage 2, liver tests normal, TSH 0.782 normal, CBC normal,  lp

## 2020-06-21 ENCOUNTER — Other Ambulatory Visit: Payer: Self-pay

## 2020-06-21 DIAGNOSIS — N3001 Acute cystitis with hematuria: Secondary | ICD-10-CM

## 2020-06-21 DIAGNOSIS — K921 Melena: Secondary | ICD-10-CM | POA: Diagnosis not present

## 2020-06-21 LAB — URINE CULTURE

## 2020-06-21 MED ORDER — SULFAMETHOXAZOLE-TRIMETHOPRIM 800-160 MG PO TABS: 1.0000 | ORAL_TABLET | Freq: Two times a day (BID) | ORAL | 0 refills | Status: DC

## 2020-06-21 NOTE — Progress Notes (Signed)
Urine culture positive e. Coli send in bactrim for 7 days lp

## 2020-07-03 DIAGNOSIS — R42 Dizziness and giddiness: Secondary | ICD-10-CM | POA: Insufficient documentation

## 2020-07-03 DIAGNOSIS — I10 Essential (primary) hypertension: Secondary | ICD-10-CM | POA: Insufficient documentation

## 2020-07-03 DIAGNOSIS — Z9289 Personal history of other medical treatment: Secondary | ICD-10-CM | POA: Insufficient documentation

## 2020-07-03 DIAGNOSIS — R0989 Other specified symptoms and signs involving the circulatory and respiratory systems: Secondary | ICD-10-CM | POA: Insufficient documentation

## 2020-07-03 DIAGNOSIS — E785 Hyperlipidemia, unspecified: Secondary | ICD-10-CM | POA: Insufficient documentation

## 2020-07-03 DIAGNOSIS — D509 Iron deficiency anemia, unspecified: Secondary | ICD-10-CM | POA: Insufficient documentation

## 2020-07-04 ENCOUNTER — Ambulatory Visit (INDEPENDENT_AMBULATORY_CARE_PROVIDER_SITE_OTHER): Payer: Medicare Other | Admitting: Cardiology

## 2020-07-04 ENCOUNTER — Encounter: Payer: Self-pay | Admitting: Cardiology

## 2020-07-04 ENCOUNTER — Other Ambulatory Visit: Payer: Self-pay

## 2020-07-04 VITALS — BP 130/60 | HR 56 | Ht 60.0 in | Wt 203.0 lb

## 2020-07-04 DIAGNOSIS — I739 Peripheral vascular disease, unspecified: Secondary | ICD-10-CM

## 2020-07-04 DIAGNOSIS — I10 Essential (primary) hypertension: Secondary | ICD-10-CM

## 2020-07-04 DIAGNOSIS — E785 Hyperlipidemia, unspecified: Secondary | ICD-10-CM | POA: Diagnosis not present

## 2020-07-04 DIAGNOSIS — R0789 Other chest pain: Secondary | ICD-10-CM

## 2020-07-04 NOTE — Patient Instructions (Signed)
Medication Instructions:  Your physician recommends that you continue on your current medications as directed. Please refer to the Current Medication list given to you today.  *If you need a refill on your cardiac medications before your next appointment, please call your pharmacy*   Lab Work: none If you have labs (blood work) drawn today and your tests are completely normal, you will receive your results only by: . MyChart Message (if you have MyChart) OR . A paper copy in the mail If you have any lab test that is abnormal or we need to change your treatment, we will call you to review the results.   Testing/Procedures: none   Follow-Up: At [facility], you and your health needs are our priority.  As part of our continuing mission to provide you with exceptional heart care, we have created designated [facility] Care Teams.  These Care Teams include your primary Cardiologist (physician) and Advanced Practice Providers (APPs -  Physician Assistants and Nurse Practitioners) who all work together to provide you with the care you need, when you need it.  We recommend signing up for the patient portal called "MyChart".  Sign up information is provided on this After Visit Summary.  MyChart is used to connect with patients for Virtual Visits (Telemedicine).  Patients are able to view lab/test results, encounter notes, upcoming appointments, etc.  Non-urgent messages can be sent to your [facility] as well.   To learn more about what you can do with MyChart, go to [email or web address].    Your next appointment:   5 month(s)  The format for your next appointment:   In Person  Olivene Cookston:   Robert Krasowski, MD   Other Instructions    

## 2020-07-04 NOTE — Progress Notes (Signed)
Cardiology Office Note:    Date:  07/04/2020   ID:  Ashley Ritter, DOB [birth year], MRN [id number]  PCP:  Ashley Miyamoto, MD  Cardiologist:  Ashley Balsam, MD    Referring MD: Ashley Ritter,*   Chief Complaint  Patient presents with  . Follow-up  Am doing fine  History of Present Illness:    Ashley Ritter is a 80 y.o. female with past medical history significant for multiple risk factors for coronary artery disease, stress test done last year did not show any ischemia, essential hypertension, dyslipidemia, carotic bruit without significant carotic arterial disease, dyslipidemia, refused any treatment for it.  Comes today 2 months of follow-up I did see her last time before hip surgery.  She went to surgery with no difficulties she is very happy satisfied with the care she got does not have pain she is joking that she is able to dance however she still use cane while walking and moving around.  Past Medical History:  Diagnosis Date  . AKI (acute kidney injury) (HCC) 11/03/2016  . Atypical chest pain   . Carotid bruit   . Dyslipidemia 12/27/2014  . Essential hypertension 12/27/2014  . H/O vertigo 11/03/2016  . History of blood transfusion   . 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 Hyperlipidemia   . Hypertension   . Hypotension 11/03/2016  . Iron deficiency anemia   . OA (osteoarthritis) of hip 04/23/2017  . Peripheral vascular disease (HCC) 04/09/2017   Carotid arterial disease  . 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  . Syncope 11/03/2016  . Vertigo     Past Surgical History:  Procedure Laterality Date  . ABDOMINAL HYSTERECTOMY    . GASTRIC RESTRICTION SURGERY    . SHOULDER SURGERY    . TONSILLECTOMY    . 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 TOTAL HIP  ARTHROPLASTY Left 03/29/2020   Procedure: TOTAL HIP ARTHROPLASTY ANTERIOR APPROACH;  Surgeon: Ollen Gross, MD;  Location: [facility];  Service: Orthopedics;  Laterality: Left;   . TUBAL LIGATION      Current Medications: Current Meds  Medication Sig  . aspirin EC 81 MG tablet Take 81 mg by mouth daily. Swallow whole.  . ferrous fumarate-iron polysaccharide complex (TANDEM) 162-115.2 MG CAPS capsule Take 1 capsule by mouth daily.  . methocarbamol (ROBAXIN) 500 MG tablet TAKE 1 TABLET BY MOUTH EVERY 6 HOURS AS NEEDED FOR MUSCLE SPASMS.  Marland Kitchen triamterene-hydrochlorothiazide (MAXZIDE-25) 37.5-25 MG tablet Take 1 tablet by mouth daily.  Marland Kitchen VITAMIN D PO Take by mouth daily.     Allergies:   Penicillins and Oxycodone   Social History   Socioeconomic History  . Marital status: Married    Spouse name: Not on file  . Number of children: Not on file  . Years of education: Not on file  . Highest education level: Not on file  Occupational History  . Not on file  Tobacco Use  . Smoking status: Former Smoker    Quit date: 1983    Years since quitting: 39.0  . Smokeless tobacco: Never Used  Vaping Use  . Vaping Use: Never used  Substance and Sexual Activity  . Alcohol use: No  . Drug use: No  . Sexual activity: Not Currently  Other Topics Concern  . Not on file  Social History Narrative  . Not on file   Social Determinants of Health   Financial Resource Strain: Not on file  Food Insecurity: Not on file  Transportation Needs: Not on file  Physical Activity: Not on file  Stress: Not on file  Social Connections: Not on file     Family History: The patient's family history includes Diabetes in her mother; Heart attack in her father; Heart disease in her father; Hyperlipidemia in her brother; Hypertension in her brother. ROS:   Please see the history of present illness.    All 14 point review of systems negative except as described per history of present  illness  EKGs/Labs/Other Studies Reviewed:      Recent Labs: 06/16/2020: ALT 8; BUN 21; Creatinine, Ser 1.10; Hemoglobin 11.3; Platelets 335; Potassium 4.5; Sodium 140; TSH 0.782  Recent Lipid Panel    Component Value Date/Time   CHOL 246 (H) 06/16/2020 1154   TRIG 64 06/16/2020 1154   HDL 79 06/16/2020 1154   CHOLHDL 3.1 06/16/2020 1154   LDLCALC 156 (H) 06/16/2020 1154    Physical Exam:    VS:  BP 130/60 (BP Location: Left Arm, Patient Position: Sitting, Cuff Size: Large)   Pulse (!) 56   Ht 5' (1.524 m)   Wt 203 lb (92.1 kg)   SpO2 98%   BMI 39.65 kg/m     Wt Readings from Last 3 Encounters:  07/04/20 203 lb (92.1 kg)  06/16/20 199 lb (90.3 kg)  05/02/20 207 lb 14.4 oz (94.3 kg)     GEN:  Well nourished, well developed in no acute distress HEENT: Normal NECK: No JVD; No carotid bruits LYMPHATICS: No lymphadenopathy CARDIAC: RRR, no murmurs, no rubs, no gallops RESPIRATORY:  Clear to auscultation without rales, wheezing or rhonchi  ABDOMEN: Soft, non-tender, non-distended MUSCULOSKELETAL:  No edema; No deformity  SKIN: Warm and dry LOWER EXTREMITIES: no swelling NEUROLOGIC:  Alert and oriented x 3 PSYCHIATRIC:  Normal affect   ASSESSMENT:    1. Peripheral vascular disease (HCC)   2. Essential hypertension   3. Dyslipidemia   4. Atypical chest pain    PLAN:    In order of problems listed above:  1. Peripheral vascular disease carotic arterial disease she is on antiplatelet therapy in form of aspirin which I will continue, asymptomatic.  Cardiac ultrasounds reviewed no critical lesions. 2. Essential hypertension blood pressure well controlled continue present management. 3. Dyslipidemia: I did review her K PN which show me LDL 156 HDL 79 again I brought an issue of potentially taking some medication she is scared and does not want to do it she said she will try some natural things she takes garlic as well as red yeast rice. 4. Atypical chest pain denies  having any, stress test showed negative for ischemia   Medication Adjustments/Labs and Tests Ordered: Current medicines are reviewed at length with the patient today.  Concerns regarding medicines are outlined above.  No orders of the defined types were placed in this encounter.  Medication changes: No orders of the defined types were placed in this encounter.   Signed, Georgeanna Lea, MD, [facility] 07/04/2020 11:35 AM    [facility]

## 2020-07-07 ENCOUNTER — Ambulatory Visit: Payer: Medicare Other

## 2020-07-11 ENCOUNTER — Other Ambulatory Visit: Payer: Self-pay | Admitting: Legal Medicine

## 2020-07-11 DIAGNOSIS — M1612 Unilateral primary osteoarthritis, left hip: Secondary | ICD-10-CM

## 2020-07-11 MED ORDER — TIZANIDINE HCL 4 MG PO TABS: 4.0000 mg | ORAL_TABLET | Freq: Four times a day (QID) | ORAL | 3 refills | Status: DC | PRN

## 2020-08-29 ENCOUNTER — Telehealth: Payer: Self-pay | Admitting: Oncology

## 2020-08-29 NOTE — Telephone Encounter (Signed)
Patient rescheduled 3/24 to 3/30 Labs, Follow Up due to conflict in schedule

## 2020-08-31 ENCOUNTER — Inpatient Hospital Stay: Payer: Medicare Other | Admitting: Oncology

## 2020-08-31 ENCOUNTER — Inpatient Hospital Stay: Payer: Medicare Other

## 2020-08-31 DIAGNOSIS — M25562 Pain in left knee: Secondary | ICD-10-CM | POA: Diagnosis not present

## 2020-08-31 DIAGNOSIS — M170 Bilateral primary osteoarthritis of knee: Secondary | ICD-10-CM | POA: Diagnosis not present

## 2020-08-31 DIAGNOSIS — M1712 Unilateral primary osteoarthritis, left knee: Secondary | ICD-10-CM | POA: Diagnosis not present

## 2020-08-31 DIAGNOSIS — M25561 Pain in right knee: Secondary | ICD-10-CM | POA: Diagnosis not present

## 2020-09-04 DIAGNOSIS — H40013 Open angle with borderline findings, low risk, bilateral: Secondary | ICD-10-CM | POA: Diagnosis not present

## 2020-09-06 NOTE — Progress Notes (Signed)
[facility]  [address] [place],  [place]  [address] [phone]  Clinic Day:  09/07/2020  Referring physician: Abigail Miyamoto,*   HISTORY OF PRESENT ILLNESS:  The patient is a 80 y.o. 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  In the past, IV Feraheme was effective in replenishing her iron stores and improving her hemoglobin.  She comes in today to reassess her anemia.  Since her last visit, the patient has been doing fine.  She denies having any overt forms of blood loss since her last visit which concern her for recurrent iron deficiency anemia.    PHYSICAL EXAM:  Blood pressure 128/69, pulse 67, temperature 98.3 F (36.8 C), resp. rate 18, height 5' (1.524 m), weight 205 lb 1.6 oz (93 kg), SpO2 97 %. Wt Readings from Last 3 Encounters:  09/07/20 205 lb 1.6 oz (93 kg)  07/04/20 203 lb (92.1 kg)  06/16/20 199 lb (90.3 kg)   Body mass index is 40.06 kg/m. Performance status (ECOG): 1 - Symptomatic but completely ambulatory Physical Exam Constitutional:      Appearance: Normal appearance. She is not ill-appearing.  HENT:     Mouth/Throat:     Mouth: Mucous membranes are moist.     Pharynx: Oropharynx is clear. No oropharyngeal exudate or posterior oropharyngeal erythema.  Cardiovascular:     Rate and Rhythm: Normal rate and regular rhythm.     Heart sounds: No murmur heard. No friction rub. No gallop.   Pulmonary:     Effort: Pulmonary effort is normal. No respiratory distress.     Breath sounds: Normal breath sounds. No wheezing, rhonchi or rales.  Chest:  Breasts:     Right: No axillary adenopathy or supraclavicular adenopathy.     Left: No axillary adenopathy or supraclavicular adenopathy.    Abdominal:     General: Bowel sounds are normal. There is no distension.     Palpations: Abdomen is soft. There is no mass.     Tenderness: There is no  abdominal tenderness.  Musculoskeletal:        General: No swelling.     Right lower leg: No edema.     Left lower leg: No edema.  Lymphadenopathy:     Cervical: No cervical adenopathy.     Upper Body:     Right upper body: No supraclavicular or axillary adenopathy.     Left upper body: No supraclavicular or axillary adenopathy.     Lower Body: No right inguinal adenopathy. No left inguinal adenopathy.  Skin:    General: Skin is warm.     Coloration: Skin is not jaundiced.     Findings: No lesion or rash.  Neurological:     General: No focal deficit present.     Mental Status: She is alert and oriented to person, place, and time. Mental status is at baseline.     Cranial Nerves: Cranial nerves are intact.  Psychiatric:        Mood and Affect: Mood normal.        Behavior: Behavior normal.        Thought Content: Thought content normal.     LABS:     Ref. Range 09/07/2020 00:00  WBC Unknown 6.7  RBC Latest Ref Range: 3.87 - 5.11  3.83 (A)  Hemoglobin Latest Ref Range: 12.0 - 16.0  10.7 (A)  HCT Latest Ref Range: 36 -  46  33 (A)  Platelets Latest Ref Range: 150 - 399  301     Ref. Range 09/07/2020 15:59  Iron Latest Ref Range: 28 - 170 ug/dL 64  UIBC Latest Units: ug/dL 342  TIBC Latest Ref Range: 250 - 450 ug/dL 876  Saturation Ratios Latest Ref Range: 10.4 - 31.8 % 18  Ferritin Latest Ref Range: 11 - 307 ng/mL 112     Ref. Range 09/07/2020 00:00  Sodium Latest Ref Range: 137 - 147  139  Potassium Latest Ref Range: 3.4 - 5.3  3.9  Chloride Latest Ref Range: 99 - 108  104  CO2 Latest Ref Range: 13 - 22  28 (A)  Glucose Unknown 93  BUN Latest Ref Range: 4 - 21  17  Creatinine Latest Ref Range: 0.5 - 1.1  1.0  Calcium Latest Ref Range: 8.7 - 10.7  9.1  Alkaline Phosphatase Latest Ref Range: 25 - 125  72  Albumin Latest Ref Range: 3.5 - 5.0  4.1  AST Latest Ref Range: 13 - 35  21  ALT Latest Ref Range: 7 - 35  9  Bilirubin, Total Unknown 0.6   ASSESSMENT & PLAN:   Assessment/Plan:  A 80 y.o. female with iron deficiency anemia.  When evaluating her labs, her iron parameters remain ideal.  Although not ideal, her hemoglobin has consistently remained above 10.  Clinically, she appears to be doing well.  As she is stable from a hematologic standpoint, I will see her back in 4 months for repeat clinical assessment.  The patient understands all the plans discussed today and is in agreement with them.    Isaish Alemu Kirby Funk, MD

## 2020-09-07 ENCOUNTER — Inpatient Hospital Stay (INDEPENDENT_AMBULATORY_CARE_PROVIDER_SITE_OTHER): Payer: Medicare Other | Admitting: Oncology

## 2020-09-07 ENCOUNTER — Other Ambulatory Visit: Payer: Self-pay

## 2020-09-07 ENCOUNTER — Other Ambulatory Visit: Payer: Self-pay | Admitting: Hematology and Oncology

## 2020-09-07 ENCOUNTER — Inpatient Hospital Stay: Payer: Medicare Other | Attending: Oncology

## 2020-09-07 ENCOUNTER — Other Ambulatory Visit: Payer: Self-pay | Admitting: Oncology

## 2020-09-07 ENCOUNTER — Telehealth: Payer: Self-pay | Admitting: Oncology

## 2020-09-07 VITALS — BP 128/69 | HR 67 | Temp 98.3°F | Resp 18 | Ht 60.0 in | Wt 205.1 lb

## 2020-09-07 DIAGNOSIS — D508 Other iron deficiency anemias: Secondary | ICD-10-CM | POA: Diagnosis not present

## 2020-09-07 DIAGNOSIS — D509 Iron deficiency anemia, unspecified: Secondary | ICD-10-CM | POA: Diagnosis not present

## 2020-09-07 DIAGNOSIS — D500 Iron deficiency anemia secondary to blood loss (chronic): Secondary | ICD-10-CM

## 2020-09-07 DIAGNOSIS — D649 Anemia, unspecified: Secondary | ICD-10-CM | POA: Diagnosis not present

## 2020-09-07 LAB — CBC AND DIFFERENTIAL
HCT: 33 — AB (ref 36–46)
Hemoglobin: 10.7 — AB (ref 12.0–16.0)
Neutrophils Absolute: 3.42
Platelets: 301 (ref 150–399)
WBC: 6.7

## 2020-09-07 LAB — FERRITIN: Ferritin: 112 ng/mL (ref 11–307)

## 2020-09-07 LAB — IRON AND TIBC
Iron: 64 ug/dL (ref 28–170)
Saturation Ratios: 18 % (ref 10.4–31.8)
TIBC: 352 ug/dL (ref 250–450)
UIBC: 288 ug/dL

## 2020-09-07 LAB — CBC: RBC: 3.83 — AB (ref 3.87–5.11)

## 2020-09-07 LAB — HEPATIC FUNCTION PANEL
ALT: 9 (ref 7–35)
AST: 21 (ref 13–35)
Alkaline Phosphatase: 72 (ref 25–125)
Bilirubin, Total: 0.6

## 2020-09-07 LAB — BASIC METABOLIC PANEL
BUN: 17 (ref 4–21)
CO2: 28 — AB (ref 13–22)
Chloride: 104 (ref 99–108)
Creatinine: 1 (ref 0.5–1.1)
Glucose: 93
Potassium: 3.9 (ref 3.4–5.3)
Sodium: 139 (ref 137–147)

## 2020-09-07 LAB — COMPREHENSIVE METABOLIC PANEL
Albumin: 4.1 (ref 3.5–5.0)
Calcium: 9.1 (ref 8.7–10.7)

## 2020-09-07 NOTE — Telephone Encounter (Signed)
Per 3/31 LOS, patient scheduled for Oct Appt's.  Gave patient Appt Summary

## 2020-09-13 DIAGNOSIS — M1712 Unilateral primary osteoarthritis, left knee: Secondary | ICD-10-CM | POA: Diagnosis not present

## 2020-09-13 DIAGNOSIS — M25562 Pain in left knee: Secondary | ICD-10-CM | POA: Diagnosis not present

## 2020-09-21 DIAGNOSIS — M1711 Unilateral primary osteoarthritis, right knee: Secondary | ICD-10-CM | POA: Diagnosis not present

## 2020-09-21 DIAGNOSIS — M25561 Pain in right knee: Secondary | ICD-10-CM | POA: Diagnosis not present

## 2020-09-28 DIAGNOSIS — M25561 Pain in right knee: Secondary | ICD-10-CM | POA: Diagnosis not present

## 2020-09-28 DIAGNOSIS — M1711 Unilateral primary osteoarthritis, right knee: Secondary | ICD-10-CM | POA: Diagnosis not present

## 2020-10-09 ENCOUNTER — Telehealth: Payer: Self-pay | Admitting: Cardiology

## 2020-10-09 DIAGNOSIS — R4189 Other symptoms and signs involving cognitive functions and awareness: Secondary | ICD-10-CM

## 2020-10-09 NOTE — Telephone Encounter (Signed)
Referral placed.

## 2020-10-09 NOTE — Telephone Encounter (Signed)
PT is requesting a referral to a neurologist.PT is stating that she would like Dr.K to refer her to a neurologist in [facility].She states that she is having a lot of brain fog.Please Advise

## 2020-12-13 ENCOUNTER — Ambulatory Visit: Payer: Medicare Other | Admitting: Legal Medicine

## 2020-12-28 ENCOUNTER — Encounter: Payer: Self-pay | Admitting: Legal Medicine

## 2020-12-28 ENCOUNTER — Ambulatory Visit (INDEPENDENT_AMBULATORY_CARE_PROVIDER_SITE_OTHER): Payer: Medicare Other | Admitting: Legal Medicine

## 2020-12-28 ENCOUNTER — Other Ambulatory Visit: Payer: Self-pay

## 2020-12-28 VITALS — BP 134/68 | HR 67 | Temp 97.3°F | Ht 61.0 in | Wt 196.0 lb

## 2020-12-28 DIAGNOSIS — I10 Essential (primary) hypertension: Secondary | ICD-10-CM

## 2020-12-28 DIAGNOSIS — E785 Hyperlipidemia, unspecified: Secondary | ICD-10-CM

## 2020-12-28 DIAGNOSIS — Z6837 Body mass index (BMI) 37.0-37.9, adult: Secondary | ICD-10-CM

## 2020-12-28 DIAGNOSIS — K5792 Diverticulitis of intestine, part unspecified, without perforation or abscess without bleeding: Secondary | ICD-10-CM | POA: Diagnosis not present

## 2020-12-28 DIAGNOSIS — N3001 Acute cystitis with hematuria: Secondary | ICD-10-CM | POA: Diagnosis not present

## 2020-12-28 LAB — POCT URINALYSIS DIP (CLINITEK)
Bilirubin, UA: NEGATIVE
Glucose, UA: NEGATIVE mg/dL
Ketones, POC UA: NEGATIVE mg/dL
Nitrite, UA: NEGATIVE
Spec Grav, UA: 1.02 (ref 1.010–1.025)
Urobilinogen, UA: 0.2 E.U./dL
pH, UA: 5.5 (ref 5.0–8.0)

## 2020-12-28 MED ORDER — CIPROFLOXACIN HCL 500 MG PO TABS: 500.0000 mg | ORAL_TABLET | Freq: Two times a day (BID) | ORAL | 0 refills | Status: AC

## 2020-12-28 NOTE — Progress Notes (Signed)
Urine culture   Established Patient Office Visit  Subjective:  Patient ID: Ashley Ritter, female    DOB: 1940/08/25  Age: 80 y.o. MRN: [id number]  CC:  Chief Complaint  Patient presents with   Hypertension    HPI Ashley Ritter presents for chronic visit  Patient presents for follow up of hypertension.  Patient tolerating maxide well with side effects.  Patient was diagnosed with hypertension 2010 so has been treated for hypertension for 10 years.Patient is working on maintaining diet and exercise regimen and follows up as directed. Complication include none.   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   Syncope 11/03/2016   Vertigo     Past Surgical History:  Procedure Laterality Date   ABDOMINAL HYSTERECTOMY     GASTRIC RESTRICTION SURGERY     SHOULDER SURGERY     TONSILLECTOMY     TOTAL HIP ARTHROPLASTY Right 04/23/2017   Procedure: RIGHT TOTAL HIP ARTHROPLASTY ANTERIOR APPROACH;  Surgeon: Ollen Gross, MD;  Location: [facility];  Service: Orthopedics;  Laterality: Right;   TOTAL HIP ARTHROPLASTY Left 03/29/2020   Procedure: TOTAL HIP ARTHROPLASTY ANTERIOR APPROACH;  Surgeon: Ollen Gross, MD;  Location: [facility];  Service: Orthopedics;  Laterality: Left;    TUBAL LIGATION      Family History  Problem Relation Age of Onset   Diabetes Mother    Heart attack Father    Heart disease Father    Hyperlipidemia Brother    Hypertension Brother      Social History   Socioeconomic History   Marital status: Married    Spouse name: Not on file   Number of children: Not on file   Years of education: Not on file   Highest education level: Not on file  Occupational History   Not on file  Tobacco Use   Smoking status: Former    Types: Cigarettes    Quit date: 72    Years since quitting: 39.5   Smokeless tobacco: Never  Vaping Use   Vaping Use: Never used  Substance and Sexual Activity   Alcohol use: No   Drug use: No   Sexual activity: Not Currently  Other Topics Concern   Not on file  Social History Narrative   Not on file   Social Determinants of Health   Financial Resource Strain: Not on file  Food Insecurity: Not on file  Transportation Needs: Not on file  Physical Activity: Not on file  Stress: Not on file  Social Connections: Not on file  Intimate Partner Violence: Not on file    Outpatient Medications Prior to Visit  Medication Sig Dispense Refill   aspirin EC 81 MG tablet Take 81 mg by mouth daily. Swallow whole.     ferrous fumarate-iron polysaccharide complex (TANDEM) 162-115.2 MG CAPS capsule Take 1 capsule by mouth daily.     tiZANidine (ZANAFLEX) 4 MG tablet Take 1 tablet (4 mg total)  by mouth every 6 (six) hours as needed for muscle spasms. 120 tablet 3   triamterene-hydrochlorothiazide (MAXZIDE-25) 37.5-25 MG tablet Take 1 tablet by mouth daily. 90 tablet 3   VITAMIN D PO Take by mouth daily.     Facility-Administered Medications Prior to Visit  Medication Dose Route Frequency Provider Last Rate Last Admin   vancomycin (VANCOCIN) 1,000 mg in sodium chloride 0.9 % 500 mL IVPB  1,000 mg Intravenous Once Perkins, Alexzandrew L, PA-C        Allergies  Allergen Reactions   Penicillins Itching    Tolerates ertapenem  20 years ago  Tolerated Cephalosporin Date: 03/30/20.     Oxycodone Itching    ROS Review of Systems  Constitutional:  Negative for activity change and appetite change.   HENT:  Negative for congestion.   Eyes:  Negative for visual disturbance.  Respiratory:  Negative for chest tightness and shortness of breath.   Cardiovascular:  Negative for chest pain.  Gastrointestinal:  Negative for abdominal pain.  Endocrine: Negative for polyuria.  Genitourinary:  Negative for difficulty urinating and dysuria.  Musculoskeletal:  Negative for arthralgias and back pain.  Neurological: Negative.   Psychiatric/Behavioral: Negative.       Objective:    Physical Exam Vitals reviewed.  Constitutional:      General: She is in acute distress.     Appearance: Normal appearance. She is obese.  HENT:     Head: Normocephalic.     Right Ear: Tympanic membrane, ear canal and external ear normal.     Left Ear: Tympanic membrane, ear canal and external ear normal.     Mouth/Throat:     Mouth: Mucous membranes are moist.     Pharynx: Oropharynx is clear.  Eyes:     Extraocular Movements: Extraocular movements intact.     Conjunctiva/sclera: Conjunctivae normal.     Pupils: Pupils are equal, round, and reactive to light.  Cardiovascular:     Rate and Rhythm: Normal rate and regular rhythm.     Pulses: Normal pulses.     Heart sounds: Normal heart sounds. No murmur heard.   No gallop.  Pulmonary:     Effort: Pulmonary effort is normal. No respiratory distress.     Breath sounds: Normal breath sounds. No wheezing.  Abdominal:     General: Abdomen is flat. Bowel sounds are normal. There is no distension.     Palpations: Abdomen is soft.     Tenderness: There is no abdominal tenderness.  Musculoskeletal:        General: Normal range of motion.     Cervical back: Normal range of motion.  Skin:    General: Skin is warm.     Capillary Refill: Capillary refill takes less than 2 seconds.  Neurological:     General: No focal deficit present.     Mental Status: She is alert and oriented to person, place, and time. Mental status is at baseline.  Psychiatric:        Mood  and Affect: Mood normal.        Behavior: Behavior normal.        Thought Content: Thought content normal.        Judgment: Judgment normal.    BP 134/68   Pulse 67   Temp (!) 97.3 F (36.3 C)   Ht 5\' 1"  (1.549 m)   Wt 196 lb (88.9 kg)   SpO2 100%   BMI 37.03 kg/m  Wt Readings from Last 3 Encounters:  12/28/20 196 lb (88.9 kg)  09/07/20 205 lb 1.6 oz (93 kg)  07/04/20 203 lb (92.1 kg)     Health Maintenance Due  Topic Date Due   Hepatitis C Screening  Never done   Zoster Vaccines- Shingrix (1 of 2) Never done   DEXA SCAN  Never done    There are no preventive care reminders to display for this patient.  Lab Results  Component Value Date   TSH 0.782 06/16/2020   Lab Results  Component Value Date   WBC 6.7 09/07/2020   HGB 10.7 (A) 09/07/2020   HCT 33 (A) 09/07/2020   MCV 85 06/16/2020   PLT 301 09/07/2020   Lab Results  Component Value Date   NA 139 09/07/2020   K 3.9 09/07/2020   CO2 28 (A) 09/07/2020   GLUCOSE 102 (H) 06/16/2020   BUN 17 09/07/2020   CREATININE 1.0 09/07/2020   BILITOT 0.4 06/16/2020   ALKPHOS 72 09/07/2020   AST 21 09/07/2020   ALT 9 09/07/2020   PROT 7.3 06/16/2020   ALBUMIN 4.1 09/07/2020   CALCIUM 9.1 09/07/2020   ANIONGAP 7 03/31/2020   Lab Results  Component Value Date   CHOL 246 (H) 06/16/2020   Lab Results  Component Value Date   HDL 79 06/16/2020   Lab Results  Component Value Date   LDLCALC 156 (H) 06/16/2020   Lab Results  Component Value Date   TRIG 64 06/16/2020   Lab Results  Component Value Date   CHOLHDL 3.1 06/16/2020   No results found for: HGBA1C    Assessment & Plan:   Diagnoses and all orders for this visit: Dyslipidemia -     Lipid panel AN INDIVIDUAL CARE PLAN for hyperlipidemia/ cholesterol was established and reinforced today.  The patient's status was assessed using clinical findings on exam, lab and other diagnostic tests. The patient's disease status was assessed based on  evidence-based guidelines and found to be fair controlled. MEDICATIONS were reviewed. SELF MANAGEMENT GOALS have been discussed and patient's success at attaining the goal of low cholesterol was assessed. RECOMMENDATION given include regular exercise 3 days a week and low cholesterol/low fat diet. CLINICAL SUMMARY including written plan to identify barriers unique to the patient due to social or economic  reasons was discussed.   Essential hypertension -     CBC with Differential/Platelet -     Comprehensive metabolic panel An individual hypertension care plan was established and reinforced today.  The patient's status was assessed using clinical findings on exam and labs or diagnostic tests. The patient's success at meeting treatment goals on disease specific evidence-based guidelines and found to be well controlled. SELF MANAGEMENT: The patient and I together assessed ways to personally work towards obtaining the recommended goals. RECOMMENDATIONS: avoid decongestants found in common cold remedies, decrease consumption of alcohol, perform routine monitoring of BP with home BP cuff, exercise, reduction of dietary salt, take medicines as prescribed, try not to miss doses and quit smoking.  Regular exercise and maintaining a healthy weight is needed.  Stress reduction may help. A CLINICAL SUMMARY including written plan identify barriers to care unique to individual due to social or financial issues.  We attempt to mutually creat solutions for individual and family understanding.   Diverticulitis -     ciprofloxacin (CIPRO) 500 MG tablet; Take 1 tablet (500 mg total) by mouth 2 (two) times daily for 10 days.  Start cipro for diverticulitis Acute cystitis with hematuria -     POCT URINALYSIS DIP (CLINITEK) -     Urine Culture Patient has pyuria and sent for culture  BMI 37.0-37.9, adult An individualize plan was formulated for obesity using patient history and physical  exam to encourage weight loss.  An evidence based program was formulated.  Patient is to cut portion size with meals and to plan physical exercise 3 days a week at least 20 minutes.  Weight watchers and other programs are helpful.  Planned amount of weight loss 10 lbs. With hypertension, patient meets criteria for morbid obesity.  Morbid obesity (HCC)   An individualize plan was formulated for obesity using patient history and physical exam to encourage weight loss.  An evidence based program was formulated.  Patient is to cut portion sze with meals and to plan physical exercise 3 days a week at least 20 minutes.  Weight watchers and other programs are helpful.  Planned amount of weight loss 10 lbs.     Follow-up: Return in about 6 months (around 06/30/2021) for fasting.    Brent BullaLawrence Che Rachal, MD

## 2020-12-29 LAB — LIPID PANEL
Chol/HDL Ratio: 3.3 ratio (ref 0.0–4.4)
Cholesterol, Total: 237 mg/dL — ABNORMAL HIGH (ref 100–199)
HDL: 71 mg/dL (ref >39)
LDL Chol Calc (NIH): 156 mg/dL — ABNORMAL HIGH (ref 0–99)
Triglycerides: 58 mg/dL (ref 0–149)
VLDL Cholesterol Cal: 10 mg/dL (ref 5–40)

## 2020-12-29 LAB — COMPREHENSIVE METABOLIC PANEL
ALT: 5 IU/L (ref 0–32)
AST: 12 IU/L (ref 0–40)
Albumin/Globulin Ratio: 1.6 (ref 1.2–2.2)
Albumin: 4.2 g/dL (ref 3.7–4.7)
Alkaline Phosphatase: 74 IU/L (ref 44–121)
BUN/Creatinine Ratio: 13 (ref 12–28)
BUN: 18 mg/dL (ref 8–27)
Bilirubin Total: 0.4 mg/dL (ref 0.0–1.2)
CO2: 27 mmol/L (ref 20–29)
Calcium: 9.6 mg/dL (ref 8.7–10.3)
Chloride: 102 mmol/L (ref 96–106)
Creatinine, Ser: 1.35 mg/dL — ABNORMAL HIGH (ref 0.57–1.00)
Globulin, Total: 2.6 g/dL (ref 1.5–4.5)
Glucose: 102 mg/dL — ABNORMAL HIGH (ref 65–99)
Potassium: 4.8 mmol/L (ref 3.5–5.2)
Sodium: 142 mmol/L (ref 134–144)
Total Protein: 6.8 g/dL (ref 6.0–8.5)
eGFR: 40 mL/min/{1.73_m2} — ABNORMAL LOW (ref >59)

## 2020-12-29 LAB — CBC WITH DIFFERENTIAL/PLATELET
Basophils Absolute: 0 10*3/uL (ref 0.0–0.2)
Basos: 1 %
EOS (ABSOLUTE): 0.1 10*3/uL (ref 0.0–0.4)
Eos: 2 %
Hematocrit: 34.7 % (ref 34.0–46.6)
Hemoglobin: 10.7 g/dL — ABNORMAL LOW (ref 11.1–15.9)
Immature Grans (Abs): 0 10*3/uL (ref 0.0–0.1)
Immature Granulocytes: 0 %
Lymphocytes Absolute: 1.6 10*3/uL (ref 0.7–3.1)
Lymphs: 28 %
MCH: 26.9 pg (ref 26.6–33.0)
MCHC: 30.8 g/dL — ABNORMAL LOW (ref 31.5–35.7)
MCV: 87 fL (ref 79–97)
Monocytes Absolute: 0.5 10*3/uL (ref 0.1–0.9)
Monocytes: 9 %
Neutrophils Absolute: 3.5 10*3/uL (ref 1.4–7.0)
Neutrophils: 60 %
Platelets: 326 10*3/uL (ref 150–450)
RBC: 3.98 x10E6/uL (ref 3.77–5.28)
RDW: 12.3 % (ref 11.7–15.4)
WBC: 5.8 10*3/uL (ref 3.4–10.8)

## 2020-12-29 LAB — URINE CULTURE

## 2020-12-29 LAB — CARDIOVASCULAR RISK ASSESSMENT

## 2020-12-29 NOTE — Progress Notes (Signed)
Chronic anemia, glucose, eGFR stage 3b-4, liver tests normal, LDL cholesterol 156 needs to be on a statin lp

## 2020-12-31 NOTE — Progress Notes (Signed)
Urine culture negative ?lp

## 2021-01-01 DIAGNOSIS — H40013 Open angle with borderline findings, low risk, bilateral: Secondary | ICD-10-CM | POA: Diagnosis not present

## 2021-01-04 DIAGNOSIS — M25561 Pain in right knee: Secondary | ICD-10-CM | POA: Diagnosis not present

## 2021-01-04 DIAGNOSIS — M1712 Unilateral primary osteoarthritis, left knee: Secondary | ICD-10-CM | POA: Diagnosis not present

## 2021-01-04 DIAGNOSIS — M170 Bilateral primary osteoarthritis of knee: Secondary | ICD-10-CM | POA: Diagnosis not present

## 2021-01-04 DIAGNOSIS — M25562 Pain in left knee: Secondary | ICD-10-CM | POA: Diagnosis not present

## 2021-01-11 DIAGNOSIS — M1711 Unilateral primary osteoarthritis, right knee: Secondary | ICD-10-CM | POA: Diagnosis not present

## 2021-01-11 DIAGNOSIS — M25561 Pain in right knee: Secondary | ICD-10-CM | POA: Diagnosis not present

## 2021-01-17 DIAGNOSIS — M25562 Pain in left knee: Secondary | ICD-10-CM | POA: Diagnosis not present

## 2021-01-17 DIAGNOSIS — M1712 Unilateral primary osteoarthritis, left knee: Secondary | ICD-10-CM | POA: Diagnosis not present

## 2021-01-24 DIAGNOSIS — M25561 Pain in right knee: Secondary | ICD-10-CM | POA: Diagnosis not present

## 2021-01-24 DIAGNOSIS — M1711 Unilateral primary osteoarthritis, right knee: Secondary | ICD-10-CM | POA: Diagnosis not present

## 2021-01-25 DIAGNOSIS — M1712 Unilateral primary osteoarthritis, left knee: Secondary | ICD-10-CM | POA: Diagnosis not present

## 2021-01-25 DIAGNOSIS — M25562 Pain in left knee: Secondary | ICD-10-CM | POA: Diagnosis not present

## 2021-01-29 ENCOUNTER — Ambulatory Visit: Payer: Medicare Other | Admitting: Cardiology

## 2021-01-31 DIAGNOSIS — M1711 Unilateral primary osteoarthritis, right knee: Secondary | ICD-10-CM | POA: Diagnosis not present

## 2021-01-31 DIAGNOSIS — M25561 Pain in right knee: Secondary | ICD-10-CM | POA: Diagnosis not present

## 2021-02-06 ENCOUNTER — Encounter: Payer: Self-pay | Admitting: Cardiology

## 2021-02-06 ENCOUNTER — Ambulatory Visit (INDEPENDENT_AMBULATORY_CARE_PROVIDER_SITE_OTHER): Payer: Medicare Other | Admitting: Cardiology

## 2021-02-06 ENCOUNTER — Other Ambulatory Visit: Payer: Self-pay

## 2021-02-06 VITALS — BP 138/62 | HR 51 | Ht 61.0 in | Wt 194.0 lb

## 2021-02-06 DIAGNOSIS — I10 Essential (primary) hypertension: Secondary | ICD-10-CM

## 2021-02-06 DIAGNOSIS — R0789 Other chest pain: Secondary | ICD-10-CM | POA: Diagnosis not present

## 2021-02-06 DIAGNOSIS — I739 Peripheral vascular disease, unspecified: Secondary | ICD-10-CM | POA: Diagnosis not present

## 2021-02-06 DIAGNOSIS — E785 Hyperlipidemia, unspecified: Secondary | ICD-10-CM | POA: Diagnosis not present

## 2021-02-06 NOTE — Patient Instructions (Signed)
Medication Instructions:  Your physician recommends that you continue on your current medications as directed. Please refer to the Current Medication list given to you today.  *If you need a refill on your cardiac medications before your next appointment, please call your pharmacy*   Lab Work: None If you have labs (blood work) drawn today and your tests are completely normal, you will receive your results only by: MyChart Message (if you have MyChart) OR A paper copy in the mail If you have any lab test that is abnormal or we need to change your treatment, we will call you to review the results.   Testing/Procedures: Your physician has requested that you have a carotid duplex. This test is an ultrasound of the carotid arteries in your neck. It looks at blood flow through these arteries that supply the brain with blood. Allow one hour for this exam. There are no restrictions or special instructions.    Follow-Up: At [facility], you and your health needs are our priority.  As part of our continuing mission to provide you with exceptional heart care, we have created designated Provider Care Teams.  These Care Teams include your primary Cardiologist (physician) and Advanced Practice Providers (APPs -  Physician Assistants and Nurse Practitioners) who all work together to provide you with the care you need, when you need it.  We recommend signing up for the patient portal called "MyChart".  Sign up information is provided on this After Visit Summary.  MyChart is used to connect with patients for Virtual Visits (Telemedicine).  Patients are able to view lab/test results, encounter notes, upcoming appointments, etc.  Non-urgent messages can be sent to your provider as well.   To learn more about what you can do with MyChart, go to [email or web address].    Your next appointment:   6 month(s)  The format for your next appointment:   In Person  Provider:   Robert Krasowski,  MD   Other Instructions   

## 2021-02-06 NOTE — Progress Notes (Signed)
Cardiology Office Note:    Date:  02/06/2021   ID:  Ashley Ritter, DOB [birth year], MRN [id number]  PCP:  Abigail Miyamoto, MD  Cardiologist:  Gypsy Balsam, MD    Referring MD: Abigail Miyamoto,*   Chief Complaint  Patient presents with   Follow-up  I am doing fine  History of Present Illness:    Ashley Ritter is a 80 y.o. 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  She is coming today to my office for follow-up overall she seems to be doing well.  Denies have any chest pain tightness squeezing pressure burning chest no palpitations no dizziness.  She complain of having some memory issues and she is worried because she got multiple family members with dementia.  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   Syncope 11/03/2016   Vertigo     Past Surgical History:  Procedure Laterality Date   ABDOMINAL HYSTERECTOMY     GASTRIC RESTRICTION SURGERY     SHOULDER SURGERY     TONSILLECTOMY     TOTAL HIP ARTHROPLASTY Right 04/23/2017   Procedure: RIGHT TOTAL HIP ARTHROPLASTY ANTERIOR APPROACH;  Surgeon: Ollen Gross, MD;  Location: [facility];  Service: Orthopedics;  Laterality: Right;   TOTAL HIP ARTHROPLASTY Left 03/29/2020   Procedure: TOTAL HIP ARTHROPLASTY ANTERIOR APPROACH;  Surgeon: Ollen Gross, MD;  Location:  [facility];  Service: Orthopedics;  Laterality: Left;    TUBAL LIGATION      Current Medications: Current Meds  Medication Sig   aspirin EC 81 MG tablet Take 81 mg by mouth daily. Swallow whole.   ferrous fumarate-iron polysaccharide complex (TANDEM) 162-115.2 MG CAPS capsule Take 1 capsule by mouth daily.   tiZANidine (ZANAFLEX) 4 MG tablet Take 1 tablet (4 mg total) by mouth every 6 (six) hours as needed for muscle spasms.   triamterene-hydrochlorothiazide (MAXZIDE-25) 37.5-25 MG tablet Take 1 tablet by mouth daily.   VITAMIN D PO Take 1 tablet by mouth daily. Unknown strength     Allergies:   Penicillins and Oxycodone   Social History   Socioeconomic History   Marital status: Married    Spouse name: Not on file   Number of children: Not on file   Years of education: Not on file   Highest education level: Not on file  Occupational History   Not on file  Tobacco Use   Smoking status: Former    Types: Cigarettes    Quit date: 96    Years since quitting: 39.6   Smokeless tobacco: Never  Vaping Use   Vaping Use: Never used  Substance and Sexual Activity   Alcohol use: No   Drug use: No   Sexual activity: Not Currently  Other Topics Concern   Not on file  Social History Narrative   Not on file  Social Determinants of Health   Financial Resource Strain: Not on file  Food Insecurity: Not on file  Transportation Needs: Not on file  Physical Activity: Not on file  Stress: Not on file  Social Connections: Not on file     Family History: The patient's family history includes Diabetes in her mother; Heart attack in her father; Heart disease in her father; Hyperlipidemia in her brother; Hypertension in her brother. ROS:   Please see the history of present illness.    All 14 point review of systems negative except as described per history of present illness  EKGs/Labs/Other Studies Reviewed:      Recent Labs: 06/16/2020: TSH 0.782 12/28/2020: ALT 5; BUN 18;  Creatinine, Ser 1.35; Hemoglobin 10.7; Platelets 326; Potassium 4.8; Sodium 142  Recent Lipid Panel    Component Value Date/Time   CHOL 237 (H) 12/28/2020 1131   TRIG 58 12/28/2020 1131   HDL 71 12/28/2020 1131   CHOLHDL 3.3 12/28/2020 1131   LDLCALC 156 (H) 12/28/2020 1131    Physical Exam:    VS:  BP 138/62 (BP Location: Left Arm, Patient Position: Sitting)   Pulse (!) 51   Ht 5\' 1"  (1.549 m)   Wt 194 lb (88 kg)   SpO2 98%   BMI 36.66 kg/m     Wt Readings from Last 3 Encounters:  02/06/21 194 lb (88 kg)  12/28/20 196 lb (88.9 kg)  09/07/20 205 lb 1.6 oz (93 kg)     GEN:  Well nourished, well developed in no acute distress HEENT: Normal NECK: No JVD; No carotid bruits LYMPHATICS: No lymphadenopathy CARDIAC: RRR, no murmurs, no rubs, no gallops RESPIRATORY:  Clear to auscultation without rales, wheezing or rhonchi  ABDOMEN: Soft, non-tender, non-distended MUSCULOSKELETAL:  No edema; No deformity  SKIN: Warm and dry LOWER EXTREMITIES: no swelling NEUROLOGIC:  Alert and oriented x 3 PSYCHIATRIC:  Normal affect   ASSESSMENT:    1. Peripheral vascular disease (HCC)   2. Essential hypertension   3. Atypical chest pain   4. Dyslipidemia    PLAN:    In order of problems listed above:  Peripheral vascular disease she is already on antiplatelet therapy which I will continue.  I will schedule her to have carotic ultrasound to check carotic arterial disease previously she had up to 39% stenosis. Essential hypertension blood pressure seems to be well controlled continue present management. Dyslipidemia I did review K PN which show me her LDL of 156 and HDL 71.  Again I brought an issue of statin therapy.  She does not want to take any medication to lower her cholesterol.  She takes garlic as well as red yeast rice and she thinks that should be sufficient.  She said that she did not have any problem because of blood so so far so she should be happy. Atypical chest pain denies  having any   Medication Adjustments/Labs and Tests Ordered: Current medicines are reviewed at length with the patient today.  Concerns regarding medicines are outlined above.  No orders of the defined types were placed in this encounter.  Medication changes: No orders of the defined types were placed in this encounter.   Signed, 09/09/20, MD, [facility] 02/06/2021 1:48 PM    [facility]

## 2021-02-06 NOTE — Addendum Note (Signed)
Addended by: Heywood Bene on: 02/06/2021 02:05 PM   Modules accepted: Orders

## 2021-02-20 ENCOUNTER — Ambulatory Visit (INDEPENDENT_AMBULATORY_CARE_PROVIDER_SITE_OTHER): Payer: Medicare Other

## 2021-02-20 ENCOUNTER — Other Ambulatory Visit: Payer: Self-pay

## 2021-02-20 DIAGNOSIS — R0789 Other chest pain: Secondary | ICD-10-CM

## 2021-02-20 DIAGNOSIS — I6523 Occlusion and stenosis of bilateral carotid arteries: Secondary | ICD-10-CM

## 2021-02-20 DIAGNOSIS — I739 Peripheral vascular disease, unspecified: Secondary | ICD-10-CM

## 2021-02-20 DIAGNOSIS — I10 Essential (primary) hypertension: Secondary | ICD-10-CM

## 2021-02-20 DIAGNOSIS — E785 Hyperlipidemia, unspecified: Secondary | ICD-10-CM

## 2021-03-04 ENCOUNTER — Other Ambulatory Visit: Payer: Self-pay | Admitting: Oncology

## 2021-03-05 NOTE — Progress Notes (Signed)
[facility]  [address] [place],  [place]  [address] [phone]  Clinic Day:  03/12/2021  Referring physician: Abigail Miyamoto,*  This document serves as a record of services personally performed by Weston Settle, MD. It was created on their behalf by [employer], a trained medical scribe. The creation of this record is based on the scribe's personal observations and the provider's statements to them.  HISTORY OF PRESENT ILLNESS:  The patient is a 80 y.o. 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  In the past, IV Feraheme was effective in replenishing her iron stores and improving her hemoglobin.  She comes in today to reassess her anemia.  Since her last visit, the patient has been doing fine.  She denies having any overt forms of blood loss since her last visit which concern her for recurrent iron deficiency anemia.    PHYSICAL EXAM:  Blood pressure 136/63, pulse 69, temperature 98.2 F (36.8 C), resp. rate 14, height 5\' 1"  (1.549 m), weight 194 lb 4.8 oz (88.1 kg), SpO2 98 %. Wt Readings from Last 3 Encounters:  03/12/21 194 lb 4.8 oz (88.1 kg)  02/06/21 194 lb (88 kg)  12/28/20 196 lb (88.9 kg)   Body mass index is 36.71 kg/m. Performance status (ECOG): 1 - Symptomatic but completely ambulatory Physical Exam Constitutional:      Appearance: Normal appearance. She is not ill-appearing.  HENT:     Mouth/Throat:     Mouth: Mucous membranes are moist.     Pharynx: Oropharynx is clear. No oropharyngeal exudate or posterior oropharyngeal erythema.  Cardiovascular:     Rate and Rhythm: Normal rate and regular rhythm.     Heart sounds: No murmur heard.   No friction rub. No gallop.  Pulmonary:     Effort: Pulmonary effort is normal. No respiratory distress.     Breath sounds: Normal breath sounds. No wheezing, rhonchi or rales.  Abdominal:     General:  Bowel sounds are normal. There is no distension.     Palpations: Abdomen is soft. There is no mass.     Tenderness: There is no abdominal tenderness.  Musculoskeletal:        General: No swelling.     Right lower leg: No edema.     Left lower leg: No edema.  Lymphadenopathy:     Cervical: No cervical adenopathy.     Upper Body:     Right upper body: No supraclavicular or axillary adenopathy.     Left upper body: No supraclavicular or axillary adenopathy.     Lower Body: No right inguinal adenopathy. No left inguinal adenopathy.  Skin:    General: Skin is warm.     Coloration: Skin is not jaundiced.     Findings: No lesion or rash.  Neurological:     General: No focal deficit present.     Mental Status: She is alert and oriented to person, place, and time. Mental status is at baseline.     Cranial Nerves: Cranial nerves are intact.  Psychiatric:        Mood and Affect: Mood normal.        Behavior: Behavior normal.        Thought Content: Thought content normal.    LABS:     Ref. Range 03/12/2021 13:51  Iron Latest Ref Range: 28 - 170 ug/dL 42  UIBC Latest Units:  ug/dL 998  TIBC Latest Ref Range: 250 - 450 ug/dL 338  Saturation Ratios Latest Ref Range: 10.4 - 31.8 % 12  Ferritin Latest Ref Range: 11 - 307 ng/mL 119   ASSESSMENT & PLAN:  Assessment/Plan:  A 80 y.o. female with iron deficiency anemia.  When evaluating her labs, her iron parameters remain ideal.  Although not normal, her hemoglobin has consistently remained above 10.  Clinically, she appears to be doing well.  As she is stable from a hematologic standpoint, I will see her back in 6 months for repeat clinical assessment.  The patient understands all the plans discussed today and is in agreement with them.     I, Foye Deer, am acting as scribe for Weston Settle, MD    I have reviewed this report as typed by the medical scribe, and it is complete and accurate.  Dequincy Kirby Funk, MD

## 2021-03-11 ENCOUNTER — Other Ambulatory Visit: Payer: Self-pay | Admitting: Oncology

## 2021-03-11 DIAGNOSIS — D508 Other iron deficiency anemias: Secondary | ICD-10-CM

## 2021-03-12 ENCOUNTER — Other Ambulatory Visit: Payer: Self-pay | Admitting: Hematology and Oncology

## 2021-03-12 ENCOUNTER — Inpatient Hospital Stay (INDEPENDENT_AMBULATORY_CARE_PROVIDER_SITE_OTHER): Payer: Medicare Other | Admitting: Oncology

## 2021-03-12 ENCOUNTER — Telehealth: Payer: Self-pay | Admitting: Oncology

## 2021-03-12 ENCOUNTER — Inpatient Hospital Stay: Payer: Medicare Other | Attending: Oncology

## 2021-03-12 VITALS — BP 136/63 | HR 69 | Temp 98.2°F | Resp 14 | Ht 61.0 in | Wt 194.3 lb

## 2021-03-12 DIAGNOSIS — D508 Other iron deficiency anemias: Secondary | ICD-10-CM

## 2021-03-12 DIAGNOSIS — D509 Iron deficiency anemia, unspecified: Secondary | ICD-10-CM | POA: Diagnosis not present

## 2021-03-12 DIAGNOSIS — D649 Anemia, unspecified: Secondary | ICD-10-CM | POA: Diagnosis not present

## 2021-03-12 LAB — BASIC METABOLIC PANEL
BUN: 24 — AB (ref 4–21)
CO2: 31 — AB (ref 13–22)
Chloride: 101 (ref 99–108)
Creatinine: 1.1 (ref 0.5–1.1)
Glucose: 105
Potassium: 3.9 (ref 3.4–5.3)
Sodium: 140 (ref 137–147)

## 2021-03-12 LAB — HEPATIC FUNCTION PANEL
ALT: 10 (ref 7–35)
AST: 24 (ref 13–35)
Alkaline Phosphatase: 69 (ref 25–125)
Bilirubin, Total: 0.5

## 2021-03-12 LAB — IRON AND TIBC
Iron: 42 ug/dL (ref 28–170)
Saturation Ratios: 12 % (ref 10.4–31.8)
TIBC: 356 ug/dL (ref 250–450)
UIBC: 314 ug/dL

## 2021-03-12 LAB — CBC: RBC: 3.66 — AB (ref 3.87–5.11)

## 2021-03-12 LAB — CBC AND DIFFERENTIAL
HCT: 32 — AB (ref 36–46)
Hemoglobin: 10.2 — AB (ref 12.0–16.0)
Neutrophils Absolute: 3.14
Platelets: 288 (ref 150–399)
WBC: 5.5

## 2021-03-12 LAB — COMPREHENSIVE METABOLIC PANEL
Albumin: 4.2 (ref 3.5–5.0)
Calcium: 9.1 (ref 8.7–10.7)

## 2021-03-12 LAB — FERRITIN: Ferritin: 119 ng/mL (ref 11–307)

## 2021-03-12 NOTE — Telephone Encounter (Signed)
Per 10/3 LOS, patient scheduled for April 2023 Appt's.  Gave patient Appt Summary 

## 2021-04-06 ENCOUNTER — Other Ambulatory Visit: Payer: Self-pay | Admitting: Legal Medicine

## 2021-04-06 DIAGNOSIS — I10 Essential (primary) hypertension: Secondary | ICD-10-CM

## 2021-05-10 DIAGNOSIS — M1712 Unilateral primary osteoarthritis, left knee: Secondary | ICD-10-CM | POA: Diagnosis not present

## 2021-05-10 DIAGNOSIS — M25562 Pain in left knee: Secondary | ICD-10-CM | POA: Diagnosis not present

## 2021-05-15 DIAGNOSIS — M170 Bilateral primary osteoarthritis of knee: Secondary | ICD-10-CM | POA: Diagnosis not present

## 2021-05-15 DIAGNOSIS — I10 Essential (primary) hypertension: Secondary | ICD-10-CM | POA: Diagnosis not present

## 2021-05-16 DIAGNOSIS — M1712 Unilateral primary osteoarthritis, left knee: Secondary | ICD-10-CM | POA: Diagnosis not present

## 2021-05-16 DIAGNOSIS — M25562 Pain in left knee: Secondary | ICD-10-CM | POA: Diagnosis not present

## 2021-05-17 ENCOUNTER — Telehealth: Payer: Self-pay

## 2021-05-17 NOTE — Telephone Encounter (Signed)
Pt called to report that she started having rectal bleeding yesterday. The blood is dark and when I have had this before, Dr Melvyn Neth has to give me blood. She wanted to know if Dr Melvyn Neth could see her tomorrow. I told her Dr Melvyn Neth will be out of office for few days. I asked pt if she could see the bottom of the toilet when she had the bleeding. She stated, "Lord yes what you talking about". I encouraged pt to present to emergency room for assessment and blood work. Pt denies SOB, light headiness. I asked when she last had a colonoscopy. She replied, "it's been a while. Ive had diverticulitis and diverticulosis in the past".   I notified Dr Melvyn Neth of above. He agreed pt needs to go the emergency room.

## 2021-05-18 DIAGNOSIS — D62 Acute posthemorrhagic anemia: Secondary | ICD-10-CM | POA: Diagnosis not present

## 2021-05-18 DIAGNOSIS — K648 Other hemorrhoids: Secondary | ICD-10-CM | POA: Diagnosis not present

## 2021-05-18 DIAGNOSIS — N183 Chronic kidney disease, stage 3 unspecified: Secondary | ICD-10-CM | POA: Diagnosis not present

## 2021-05-18 DIAGNOSIS — Z8719 Personal history of other diseases of the digestive system: Secondary | ICD-10-CM | POA: Diagnosis not present

## 2021-05-18 DIAGNOSIS — K922 Gastrointestinal hemorrhage, unspecified: Secondary | ICD-10-CM | POA: Insufficient documentation

## 2021-05-18 DIAGNOSIS — I129 Hypertensive chronic kidney disease with stage 1 through stage 4 chronic kidney disease, or unspecified chronic kidney disease: Secondary | ICD-10-CM | POA: Diagnosis not present

## 2021-05-18 DIAGNOSIS — D649 Anemia, unspecified: Secondary | ICD-10-CM | POA: Diagnosis not present

## 2021-05-18 DIAGNOSIS — K5791 Diverticulosis of intestine, part unspecified, without perforation or abscess with bleeding: Secondary | ICD-10-CM | POA: Diagnosis not present

## 2021-05-18 DIAGNOSIS — K625 Hemorrhage of anus and rectum: Secondary | ICD-10-CM | POA: Diagnosis not present

## 2021-05-18 DIAGNOSIS — N182 Chronic kidney disease, stage 2 (mild): Secondary | ICD-10-CM | POA: Diagnosis not present

## 2021-05-18 DIAGNOSIS — Z880 Allergy status to penicillin: Secondary | ICD-10-CM | POA: Diagnosis not present

## 2021-05-18 DIAGNOSIS — D631 Anemia in chronic kidney disease: Secondary | ICD-10-CM | POA: Diagnosis not present

## 2021-05-18 DIAGNOSIS — Z888 Allergy status to other drugs, medicaments and biological substances status: Secondary | ICD-10-CM | POA: Diagnosis not present

## 2021-05-22 ENCOUNTER — Telehealth: Payer: Self-pay

## 2021-05-22 NOTE — Telephone Encounter (Signed)
Spoke with pharmacist @ [facility] [address].  Put three refills on patient's [employer].

## 2021-06-13 ENCOUNTER — Other Ambulatory Visit: Payer: Self-pay

## 2021-06-13 DIAGNOSIS — I10 Essential (primary) hypertension: Secondary | ICD-10-CM

## 2021-06-13 MED ORDER — TRIAMTERENE-HCTZ 37.5-25 MG PO TABS: 1.0000 | ORAL_TABLET | Freq: Every day | ORAL | 3 refills | Status: DC

## 2021-06-14 DIAGNOSIS — Z96643 Presence of artificial hip joint, bilateral: Secondary | ICD-10-CM | POA: Diagnosis not present

## 2021-06-14 DIAGNOSIS — Z96642 Presence of left artificial hip joint: Secondary | ICD-10-CM | POA: Diagnosis not present

## 2021-07-02 DIAGNOSIS — H40013 Open angle with borderline findings, low risk, bilateral: Secondary | ICD-10-CM | POA: Diagnosis not present

## 2021-07-04 DIAGNOSIS — I10 Essential (primary) hypertension: Secondary | ICD-10-CM | POA: Diagnosis not present

## 2021-07-04 DIAGNOSIS — M25562 Pain in left knee: Secondary | ICD-10-CM | POA: Diagnosis not present

## 2021-07-04 DIAGNOSIS — M170 Bilateral primary osteoarthritis of knee: Secondary | ICD-10-CM | POA: Diagnosis not present

## 2021-07-05 DIAGNOSIS — K5669 Other partial intestinal obstruction: Secondary | ICD-10-CM | POA: Diagnosis not present

## 2021-07-05 DIAGNOSIS — Z9889 Other specified postprocedural states: Secondary | ICD-10-CM | POA: Diagnosis not present

## 2021-07-05 DIAGNOSIS — K921 Melena: Secondary | ICD-10-CM | POA: Diagnosis not present

## 2021-07-05 DIAGNOSIS — K6389 Other specified diseases of intestine: Secondary | ICD-10-CM | POA: Diagnosis not present

## 2021-07-05 DIAGNOSIS — Z980 Intestinal bypass and anastomosis status: Secondary | ICD-10-CM | POA: Diagnosis not present

## 2021-08-03 DIAGNOSIS — I10 Essential (primary) hypertension: Secondary | ICD-10-CM | POA: Diagnosis not present

## 2021-08-03 DIAGNOSIS — M170 Bilateral primary osteoarthritis of knee: Secondary | ICD-10-CM | POA: Diagnosis not present

## 2021-08-08 ENCOUNTER — Ambulatory Visit: Payer: Medicare Other | Admitting: Cardiology

## 2021-08-13 DIAGNOSIS — M25561 Pain in right knee: Secondary | ICD-10-CM | POA: Diagnosis not present

## 2021-08-13 DIAGNOSIS — M170 Bilateral primary osteoarthritis of knee: Secondary | ICD-10-CM | POA: Diagnosis not present

## 2021-08-13 DIAGNOSIS — M25562 Pain in left knee: Secondary | ICD-10-CM | POA: Diagnosis not present

## 2021-08-14 DIAGNOSIS — M170 Bilateral primary osteoarthritis of knee: Secondary | ICD-10-CM | POA: Diagnosis not present

## 2021-08-14 DIAGNOSIS — M25562 Pain in left knee: Secondary | ICD-10-CM | POA: Diagnosis not present

## 2021-08-14 DIAGNOSIS — M25561 Pain in right knee: Secondary | ICD-10-CM | POA: Diagnosis not present

## 2021-08-27 DIAGNOSIS — M25561 Pain in right knee: Secondary | ICD-10-CM | POA: Diagnosis not present

## 2021-08-27 DIAGNOSIS — M170 Bilateral primary osteoarthritis of knee: Secondary | ICD-10-CM | POA: Diagnosis not present

## 2021-08-27 DIAGNOSIS — M25562 Pain in left knee: Secondary | ICD-10-CM | POA: Diagnosis not present

## 2021-09-03 DIAGNOSIS — M170 Bilateral primary osteoarthritis of knee: Secondary | ICD-10-CM | POA: Diagnosis not present

## 2021-09-03 DIAGNOSIS — M25561 Pain in right knee: Secondary | ICD-10-CM | POA: Diagnosis not present

## 2021-09-03 DIAGNOSIS — I10 Essential (primary) hypertension: Secondary | ICD-10-CM | POA: Diagnosis not present

## 2021-09-03 DIAGNOSIS — M25562 Pain in left knee: Secondary | ICD-10-CM | POA: Diagnosis not present

## 2021-09-03 DIAGNOSIS — M25361 Other instability, right knee: Secondary | ICD-10-CM | POA: Diagnosis not present

## 2021-09-06 ENCOUNTER — Telehealth: Payer: Self-pay

## 2021-09-06 NOTE — Telephone Encounter (Signed)
Pt spoke with Angelique Blonder before I could call her back. Appt changed. ?

## 2021-09-10 ENCOUNTER — Ambulatory Visit: Payer: Medicare Other | Admitting: Oncology

## 2021-09-10 ENCOUNTER — Other Ambulatory Visit: Payer: Medicare Other

## 2021-09-10 DIAGNOSIS — M25562 Pain in left knee: Secondary | ICD-10-CM | POA: Diagnosis not present

## 2021-09-10 DIAGNOSIS — M170 Bilateral primary osteoarthritis of knee: Secondary | ICD-10-CM | POA: Diagnosis not present

## 2021-09-10 DIAGNOSIS — M25561 Pain in right knee: Secondary | ICD-10-CM | POA: Diagnosis not present

## 2021-09-10 NOTE — Progress Notes (Signed)
?[facility]  ?[address] ?[place],  [place]  [address] ?[phone] ? ?Clinic Day:  09/11/2021 ? ?Referring physician: Abigail Miyamoto,* ? ?HISTORY OF PRESENT ILLNESS:  ?The patient is a 81 y.o. 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  In the past, IV Feraheme was effective in replenishing her iron stores and improving her hemoglobin.  She comes in today to reassess her anemia.  Since her last visit, the patient has been doing fine.  She denies having increased fatigue or any overt forms of blood loss which concern her for recurrent iron deficiency anemia.   ? ?PHYSICAL EXAM:  ?Blood pressure (!) 173/79, pulse 66, temperature 98.1 ?F (36.7 ?C), resp. rate 14, height 5\' 1"  (1.549 m), weight 188 lb 3.2 oz (85.4 kg), SpO2 97 %. ?Wt Readings from Last 3 Encounters:  ?09/11/21 188 lb 3.2 oz (85.4 kg)  ?03/12/21 194 lb 4.8 oz (88.1 kg)  ?02/06/21 194 lb (88 kg)  ? ?Body mass index is 35.56 kg/m?02/08/21 ?Performance status (ECOG): 1 - Symptomatic but completely ambulatory ?Physical Exam ?Constitutional:   ?   Appearance: Normal appearance. She is not ill-appearing.  ?HENT:  ?   Mouth/Throat:  ?   Mouth: Mucous membranes are moist.  ?   Pharynx: Oropharynx is clear. No oropharyngeal exudate or posterior oropharyngeal erythema.  ?Cardiovascular:  ?   Rate and Rhythm: Normal rate and regular rhythm.  ?   Heart sounds: No murmur heard. ?  No friction rub. No gallop.  ?Pulmonary:  ?   Effort: Pulmonary effort is normal. No respiratory distress.  ?   Breath sounds: Normal breath sounds. No wheezing, rhonchi or rales.  ?Abdominal:  ?   General: Bowel sounds are normal. There is no distension.  ?   Palpations: Abdomen is soft. There is no mass.  ?   Tenderness: There is no abdominal tenderness.  ?Musculoskeletal:     ?   General: No swelling.  ?   Right lower leg: No edema.  ?   Left lower leg: No edema.   ?Lymphadenopathy:  ?   Cervical: No cervical adenopathy.  ?   Upper Body:  ?   Right upper body: No supraclavicular or axillary adenopathy.  ?   Left upper body: No supraclavicular or axillary adenopathy.  ?   Lower Body: No right inguinal adenopathy. No left inguinal adenopathy.  ?Skin: ?   General: Skin is warm.  ?   Coloration: Skin is not jaundiced.  ?   Findings: No lesion or rash.  ?Neurological:  ?   General: No focal deficit present.  ?   Mental Status: She is alert and oriented to person, place, and time. Mental status is at baseline.  ?Psychiatric:     ?   Mood and Affect: Mood normal.     ?   Behavior: Behavior normal.     ?   Thought Content: Thought content normal.  ? ? ?LABS:  ? ? Latest Reference Range & Units 09/11/21 00:00  ?WBC  5.6 (E)  ?RBC 3.87 - 5.11  3.73 ! (E)  ?Hemoglobin 12.0 - 16.0  10.1 ! (E)  ?HCT 36 - 46  32 ! (E)  ?Platelets 150 - 400 K/uL 300 (E)  ?NEUT#  3.14 (E)  ?!: Data is abnormal ?(E): External lab result ? ? Latest Reference Range & Units 09/11/21 00:00  ?Sodium 137 -  147  139 (E)  ?Potassium 3.5 - 5.1 mEq/L 4.0 (E)  ?Chloride 99 - 108  107 (E)  ?CO2 13 - 22  28 ! (E)  ?Glucose  97 (E)  ?BUN 4 - 21  20 (E)  ?Creatinine 0.5 - 1.1  1.1 (E)  ?Calcium 8.7 - 10.7  9.2 (E)  ?Alkaline Phosphatase 25 - 125  65 (E)  ?Albumin 3.5 - 5.0  3.9 (E)  ?AST 13 - 35  24 (E)  ?ALT 7 - 35 U/L 12 (E)  ?Bilirubin, Total  0.6 (E)  ?!: Data is abnormal ?(E): External lab result ? ? Latest Reference Range & Units 09/11/21 14:23  ?Iron 28 - 170 ug/dL 72  ?UIBC ug/dL 403  ?TIBC 250 - 450 ug/dL 474  ?Saturation Ratios 10.4 - 31.8 % 19  ?Ferritin 11 - 307 ng/mL 46  ? ?ASSESSMENT & PLAN:  ?Assessment/Plan:  An 81 y.o. female with iron deficiency anemia.  When evaluating her labs, her iron parameters remain normal.  Although not normal, her hemoglobin has consistently remained above 10.  Clinically, she appears to be doing well.  As she is stable from a hematologic standpoint, I will see her back in 6  months for repeat clinical assessment.  The patient understands all the plans discussed today and is in agreement with them.   ? ?Serria Sloma Kirby Funk, MD ?  ? ? ?  ?

## 2021-09-11 ENCOUNTER — Inpatient Hospital Stay (INDEPENDENT_AMBULATORY_CARE_PROVIDER_SITE_OTHER): Payer: Medicare Other | Admitting: Oncology

## 2021-09-11 ENCOUNTER — Telehealth: Payer: Self-pay | Admitting: Oncology

## 2021-09-11 ENCOUNTER — Other Ambulatory Visit: Payer: Self-pay | Admitting: Oncology

## 2021-09-11 ENCOUNTER — Inpatient Hospital Stay: Payer: Medicare Other | Attending: Oncology

## 2021-09-11 VITALS — BP 173/79 | HR 66 | Temp 98.1°F | Resp 14 | Ht 61.0 in | Wt 188.2 lb

## 2021-09-11 DIAGNOSIS — D508 Other iron deficiency anemias: Secondary | ICD-10-CM | POA: Insufficient documentation

## 2021-09-11 DIAGNOSIS — D500 Iron deficiency anemia secondary to blood loss (chronic): Secondary | ICD-10-CM | POA: Diagnosis not present

## 2021-09-11 LAB — CBC AND DIFFERENTIAL
HCT: 32 — AB (ref 36–46)
Hemoglobin: 10.1 — AB (ref 12.0–16.0)
Neutrophils Absolute: 3.14
Platelets: 300 10*3/uL (ref 150–400)
WBC: 5.6

## 2021-09-11 LAB — HEPATIC FUNCTION PANEL
ALT: 12 U/L (ref 7–35)
AST: 24 (ref 13–35)
Alkaline Phosphatase: 65 (ref 25–125)
Bilirubin, Total: 0.6

## 2021-09-11 LAB — COMPREHENSIVE METABOLIC PANEL
Albumin: 3.9 (ref 3.5–5.0)
Calcium: 9.2 (ref 8.7–10.7)

## 2021-09-11 LAB — CBC: RBC: 3.73 — AB (ref 3.87–5.11)

## 2021-09-11 LAB — IRON AND TIBC
Iron: 72 ug/dL (ref 28–170)
Saturation Ratios: 19 % (ref 10.4–31.8)
TIBC: 371 ug/dL (ref 250–450)
UIBC: 299 ug/dL

## 2021-09-11 LAB — BASIC METABOLIC PANEL
BUN: 20 (ref 4–21)
CO2: 28 — AB (ref 13–22)
Chloride: 107 (ref 99–108)
Creatinine: 1.1 (ref 0.5–1.1)
Glucose: 97
Potassium: 4 mEq/L (ref 3.5–5.1)
Sodium: 139 (ref 137–147)

## 2021-09-11 LAB — FERRITIN: Ferritin: 46 ng/mL (ref 11–307)

## 2021-09-11 NOTE — Telephone Encounter (Signed)
Per 09/11/21 los next appt scheduled and confirmed with patient ?

## 2021-09-24 ENCOUNTER — Encounter: Payer: Self-pay | Admitting: Legal Medicine

## 2021-09-24 ENCOUNTER — Ambulatory Visit (INDEPENDENT_AMBULATORY_CARE_PROVIDER_SITE_OTHER): Payer: Medicare Other | Admitting: Legal Medicine

## 2021-09-24 VITALS — BP 124/60 | HR 51 | Temp 99.1°F | Resp 15 | Ht 61.0 in | Wt 189.0 lb

## 2021-09-24 DIAGNOSIS — I10 Essential (primary) hypertension: Secondary | ICD-10-CM

## 2021-09-24 NOTE — Progress Notes (Signed)
? ?Acute Office Visit ? ?Subjective:  ? ? Patient ID: Ashley Ritter, female    DOB: December 20, 1940, 81 y.o.   MRN: [id number] ? ?Chief Complaint  ?Patient presents with  ? Hypertension  ? ? ?HPI: ?Patient is in today for blood pressure check. She saw Dr Bobby Rumpf oncologist and her blood pressure was high that day 173/79. Patient denied any chest pain, sob.She did not sleep. BP now normal, no chest pain, no SOB.She continues on medicines.  No charges, no symptoms. ? ?Past Medical History:  ?Diagnosis Date  ? AKI (acute kidney injury) ([place]) 11/03/2016  ? Atypical chest pain   ? Carotid bruit   ? Dyslipidemia 12/27/2014  ? Essential hypertension 12/27/2014  ? H/O vertigo 11/03/2016  ? History of blood transfusion   ? 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 Hyperlipidemia   ? Hypertension   ? Hypotension 11/03/2016  ? Iron deficiency anemia   ? OA (osteoarthritis) of hip 04/23/2017  ? Peripheral vascular disease ([place]) 04/09/2017  ? Carotid arterial disease  ? 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 Syncope 11/03/2016  ? Vertigo   ? ? ?Past Surgical History:  ?Procedure Laterality Date  ? ABDOMINAL HYSTERECTOMY    ? GASTRIC RESTRICTION SURGERY    ? SHOULDER SURGERY    ? TONSILLECTOMY    ? TOTAL HIP ARTHROPLASTY Right 04/23/2017  ? Procedure: RIGHT TOTAL HIP ARTHROPLASTY ANTERIOR APPROACH;  Surgeon: Gaynelle Arabian, MD;  Location: [facility];  Service: Orthopedics;  Laterality: Right;  ? TOTAL HIP ARTHROPLASTY Left 03/29/2020  ? Procedure: TOTAL HIP ARTHROPLASTY ANTERIOR APPROACH;  Surgeon: Gaynelle Arabian, MD;  Location: [facility];  Service: Orthopedics;  Laterality: Left;  16mn  ? TUBAL LIGATION    ? ? ?Family History  ?Problem Relation Age of Onset  ? Diabetes Mother   ? Heart attack Father   ? Heart disease Father   ? Hyperlipidemia Brother   ? Hypertension Brother   ? ? ?Social History  ? ?Socioeconomic History  ? Marital status: Married  ?  Spouse name: Not on file  ? Number of children: Not  on file  ? Years of education: Not on file  ? Highest education level: Not on file  ?Occupational History  ? Not on file  ?Tobacco Use  ? Smoking status: Former  ?  Types: Cigarettes  ?  Quit date: 121 ?  Years since quitting: 40.3  ? Smokeless tobacco: Never  ?Vaping Use  ? Vaping Use: Never used  ?Substance and Sexual Activity  ? Alcohol use: No  ? Drug use: No  ? Sexual activity: Not Currently  ?Other Topics Concern  ? Not on file  ?Social History Narrative  ? Not on file  ? ?Social Determinants of Health  ? ?Financial Resource Strain: Not on file  ?Food Insecurity: Not on file  ?Transportation Needs: Not on file  ?Physical Activity: Not on file  ?Stress: Not on file  ?Social Connections: Not on file  ?Intimate Partner Violence: Not on file  ? ? ?Outpatient Medications Prior to Visit  ?Medication Sig Dispense Refill  ? aspirin EC 81 MG tablet Take 81 mg by mouth daily. Swallow whole.    ? ferrous fumarate-iron polysaccharide complex (TANDEM) 162-115.2 MG CAPS capsule Take 1 capsule by mouth daily.    ? triamterene-hydrochlorothiazide (MAXZIDE-25) 37.5-25 MG tablet Take 1 tablet by mouth daily. 90 tablet 3  ? VITAMIN D PO Take 1 tablet by mouth  daily. Unknown strength    ? tiZANidine (ZANAFLEX) 4 MG tablet Take 1 tablet (4 mg total) by mouth every 6 (six) hours as needed for muscle spasms. 120 tablet 3  ? ?Facility-Administered Medications Prior to Visit  ?Medication Dose Route Frequency Provider Last Rate Last Admin  ? vancomycin (VANCOCIN) 1,000 mg in sodium chloride 0.9 % 500 mL IVPB  1,000 mg Intravenous Once Perkins, Alexzandrew L, PA-C      ? ? ?Allergies  ?Allergen Reactions  ? Penicillins Itching  ?  Tolerates ertapenem  ?20 years ago ? ?Tolerated Cephalosporin Date: 03/30/20. ? ?  ? Oxycodone Itching  ? ? ?Review of Systems  ?Constitutional:  Negative for chills, fatigue and fever.  ?HENT:  Negative for congestion, ear pain and sore throat.   ?Eyes:  Negative for visual disturbance.  ?Respiratory:   Negative for cough and shortness of breath.   ?Cardiovascular:  Negative for chest pain and palpitations.  ?Gastrointestinal:  Negative for abdominal pain, constipation, diarrhea, nausea and vomiting.  ?Endocrine: Negative for polydipsia, polyphagia and polyuria.  ?Genitourinary:  Negative for difficulty urinating and dysuria.  ?Musculoskeletal:  Negative for arthralgias, back pain and myalgias.  ?Skin:  Negative for rash.  ?Neurological:  Negative for headaches.  ?Psychiatric/Behavioral:  Negative for dysphoric mood. The patient is not nervous/anxious.   ? ?   ?Objective:  ?  ?Physical Exam ?Vitals reviewed.  ?Constitutional:   ?   General: She is in acute distress.  ?   Appearance: Normal appearance.  ?HENT:  ?   Head: Normocephalic.  ?   Right Ear: Tympanic membrane normal.  ?   Left Ear: Tympanic membrane normal.  ?Eyes:  ?   Extraocular Movements: Extraocular movements intact.  ?   Conjunctiva/sclera: Conjunctivae normal.  ?   Pupils: Pupils are equal, round, and reactive to light.  ?Cardiovascular:  ?   Rate and Rhythm: Normal rate and regular rhythm.  ?   Pulses: Normal pulses.  ?   Heart sounds: No murmur heard. ?  No gallop.  ?Pulmonary:  ?   Effort: Pulmonary effort is normal. No respiratory distress.  ?   Breath sounds: Normal breath sounds. No wheezing.  ?Abdominal:  ?   General: Abdomen is flat. Bowel sounds are normal. There is no distension.  ?   Tenderness: There is no abdominal tenderness.  ?Musculoskeletal:  ?   Right lower leg: No edema.  ?   Left lower leg: No edema.  ?Skin: ?   General: Skin is warm.  ?   Capillary Refill: Capillary refill takes less than 2 seconds.  ?Neurological:  ?   General: No focal deficit present.  ?   Mental Status: She is alert and oriented to person, place, and time.  ? ? ?BP 124/60   Pulse (!) 51   Temp 99.1 ?F (37.3 ?C)   Resp 15   Ht _0  (1.549 m)   Wt 189 lb (85.7 kg)   SpO2 99%   BMI 35.71 kg/m?  ?Wt Readings from Last 3 Encounters:  ?09/24/21 189 lb  (85.7 kg)  ?09/11/21 188 lb 3.2 oz (85.4 kg)  ?03/12/21 194 lb 4.8 oz (88.1 kg)  ? ? ?Health Maintenance Due  ?Topic Date Due  ? TETANUS/TDAP  Never done  ? Zoster Vaccines- Shingrix (1 of 2) Never done  ? Pneumonia Vaccine 60+ Years old (1 - PCV) Never done  ? DEXA SCAN  Never done  ? ? ?There are no preventive care reminders  to display for this patient. ? ? ?Lab Results  ?Component Value Date  ? TSH 0.782 06/16/2020  ? ?Lab Results  ?Component Value Date  ? WBC 5.6 09/11/2021  ? HGB 10.1 (A) 09/11/2021  ? HCT 32 (A) 09/11/2021  ? MCV 87 12/28/2020  ? PLT 300 09/11/2021  ? ?Lab Results  ?Component Value Date  ? NA 139 09/11/2021  ? K 4.0 09/11/2021  ? CO2 28 (A) 09/11/2021  ? GLUCOSE 102 (H) 12/28/2020  ? BUN 20 09/11/2021  ? CREATININE 1.1 09/11/2021  ? BILITOT 0.4 12/28/2020  ? ALKPHOS 65 09/11/2021  ? AST 24 09/11/2021  ? ALT 12 09/11/2021  ? PROT 6.8 12/28/2020  ? ALBUMIN 3.9 09/11/2021  ? CALCIUM 9.2 09/11/2021  ? ANIONGAP 7 03/31/2020  ? EGFR 40 (L) 12/28/2020  ? ?Lab Results  ?Component Value Date  ? CHOL 237 (H) 12/28/2020  ? ?Lab Results  ?Component Value Date  ? HDL 71 12/28/2020  ? ?Lab Results  ?Component Value Date  ? LDLCALC 156 (H) 12/28/2020  ? ?Lab Results  ?Component Value Date  ? TRIG 58 12/28/2020  ? ?Lab Results  ?Component Value Date  ? CHOLHDL 3.3 12/28/2020  ? ?No results found for: HGBA1C ? ?   ?Assessment & Plan:  ? ?Diagnoses and all orders for this visit: ?Essential hypertension ?Hypertension is stable at this visit, elevated at last visit probably due to stress, follow up nurse visit for BP 2 weeks ?  ? ? ?  ? ?Follow-up: Return if symptoms worsen or fail to improve. ? ?An After Visit Summary was printed and given to the patient. ? ?Reinaldo Meeker, MD ?[place] ?[phone] ?

## 2021-09-28 ENCOUNTER — Encounter: Payer: Self-pay | Admitting: Cardiology

## 2021-09-28 ENCOUNTER — Ambulatory Visit (INDEPENDENT_AMBULATORY_CARE_PROVIDER_SITE_OTHER): Payer: Medicare Other | Admitting: Cardiology

## 2021-09-28 VITALS — BP 140/90 | HR 87 | Ht 61.5 in | Wt 191.2 lb

## 2021-09-28 DIAGNOSIS — R0789 Other chest pain: Secondary | ICD-10-CM

## 2021-09-28 DIAGNOSIS — E782 Mixed hyperlipidemia: Secondary | ICD-10-CM

## 2021-09-28 DIAGNOSIS — I10 Essential (primary) hypertension: Secondary | ICD-10-CM

## 2021-09-28 DIAGNOSIS — I6523 Occlusion and stenosis of bilateral carotid arteries: Secondary | ICD-10-CM

## 2021-09-28 DIAGNOSIS — I739 Peripheral vascular disease, unspecified: Secondary | ICD-10-CM | POA: Diagnosis not present

## 2021-09-28 NOTE — Progress Notes (Signed)
?Cardiology Office Note:   ? ?Date:  09/28/2021  ? ?ID:  Ashley Ritter, DOB 05-13-1941, MRN [id number] ? ?PCP:  Lillard Anes, MD  ?Cardiologist:  Jenne Campus, MD   ? ?Referring MD: Lillard Anes,*  ? ?Chief Complaint  ?Patient presents with  ? Follow-up  ? ? ?History of Present Illness:   ? ?Ashley Ritter is a 81 y.o. female with past medical history significant for peripheral vascular disease in form of up to 39% stenosis of both carotid arteries, essential hypertension, dyslipidemia refusing any statins she is coming today to my office for follow-up overall she seems to be doing well.  She denies have any chest pain, tightness, pressure, burning or squeezing in the chest. ? ?Past Medical History:  ?Diagnosis Date  ? AKI (acute kidney injury) ([place]) 11/03/2016  ? Atypical chest pain   ? Carotid bruit   ? Dyslipidemia 12/27/2014  ? Essential hypertension 12/27/2014  ? H/O vertigo 11/03/2016  ? History of blood transfusion   ? 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 Hyperlipidemia   ? Hypertension   ? Hypotension 11/03/2016  ? Iron deficiency anemia   ? OA (osteoarthritis) of hip 04/23/2017  ? Peripheral vascular disease ([place]) 04/09/2017  ? Carotid arterial disease  ? 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 Syncope 11/03/2016  ? Vertigo   ? ? ?Past Surgical History:  ?Procedure Laterality Date  ? ABDOMINAL HYSTERECTOMY    ? GASTRIC RESTRICTION SURGERY    ? SHOULDER SURGERY    ? TONSILLECTOMY    ? TOTAL HIP ARTHROPLASTY Right 04/23/2017  ? Procedure: RIGHT TOTAL HIP ARTHROPLASTY ANTERIOR APPROACH;  Surgeon: Gaynelle Arabian, MD;  Location: [facility];  Service: Orthopedics;  Laterality: Right;  ? TOTAL HIP ARTHROPLASTY Left 03/29/2020  ? Procedure: TOTAL HIP ARTHROPLASTY ANTERIOR APPROACH;  Surgeon: Gaynelle Arabian, MD;  Location: [facility];  Service: Orthopedics;  Laterality: Left;  19min  ? TUBAL LIGATION    ? ? ?Current Medications: ?Current Meds  ?Medication  Sig  ? aspirin EC 81 MG tablet Take 81 mg by mouth daily. Swallow whole.  ? ferrous fumarate-iron polysaccharide complex (TANDEM) 162-115.2 MG CAPS capsule Take 1 capsule by mouth daily.  ? triamterene-hydrochlorothiazide (MAXZIDE-25) 37.5-25 MG tablet Take 1 tablet by mouth daily.  ? VITAMIN D PO Take 1 tablet by mouth daily. Unknown strength  ?  ? ?Allergies:   Penicillins and Oxycodone  ? ?Social History  ? ?Socioeconomic History  ? Marital status: Married  ?  Spouse name: Not on file  ? Number of children: Not on file  ? Years of education: Not on file  ? Highest education level: Not on file  ?Occupational History  ? Not on file  ?Tobacco Use  ? Smoking status: Former  ?  Types: Cigarettes  ?  Quit date: 62  ?  Years since quitting: 40.3  ? Smokeless tobacco: Never  ?Vaping Use  ? Vaping Use: Never used  ?Substance and Sexual Activity  ? Alcohol use: No  ? Drug use: No  ? Sexual activity: Not Currently  ?Other Topics Concern  ? Not on file  ?Social History Narrative  ? Not on file  ? ?Social Determinants of Health  ? ?Financial Resource Strain: Not on file  ?Food Insecurity: Not on file  ?Transportation Needs: Not on file  ?Physical Activity: Not on file  ?Stress: Not on file  ?Social Connections: Not on file  ?  ? ?  Family History: ?The patient's family history includes Diabetes in her mother; Heart attack in her father; Heart disease in her father; Hyperlipidemia in her brother; Hypertension in her brother. ?ROS:   ?Please see the history of present illness.    ?All 14 point review of systems negative except as described per history of present illness ? ?EKGs/Labs/Other Studies Reviewed:   ? ? ? ?Recent Labs: ?09/11/2021: ALT 12; BUN 20; Creatinine 1.1; Hemoglobin 10.1; Platelets 300; Potassium 4.0; Sodium 139  ?Recent Lipid Panel ?   ?Component Value Date/Time  ? CHOL 237 (H) 12/28/2020 1131  ? TRIG 58 12/28/2020 1131  ? HDL 71 12/28/2020 1131  ? CHOLHDL 3.3 12/28/2020 1131  ? LDLCALC 156 (H) 12/28/2020 1131   ? ? ?Physical Exam:   ? ?VS:  BP 140/90 (BP Location: Right Arm, Patient Position: Sitting)   Pulse 87   Ht 5' 1.5" (1.562 m)   Wt 191 lb 3.2 oz (86.7 kg)   SpO2 98%   BMI 35.54 kg/m?    ? ?Wt Readings from Last 3 Encounters:  ?09/28/21 191 lb 3.2 oz (86.7 kg)  ?09/24/21 189 lb (85.7 kg)  ?09/11/21 188 lb 3.2 oz (85.4 kg)  ?  ? ?GEN:  Well nourished, well developed in no acute distress ?HEENT: Normal ?NECK: No JVD; No carotid bruits ?LYMPHATICS: No lymphadenopathy ?CARDIAC: RRR, no murmurs, no rubs, no gallops ?RESPIRATORY:  Clear to auscultation without rales, wheezing or rhonchi  ?ABDOMEN: Soft, non-tender, non-distended ?MUSCULOSKELETAL:  No edema; No deformity  ?SKIN: Warm and dry ?LOWER EXTREMITIES: no swelling ?NEUROLOGIC:  Alert and oriented x 3 ?PSYCHIATRIC:  Normal affect  ? ?ASSESSMENT:   ? ?1. Essential hypertension   ?2. Peripheral vascular disease ([place])   ?3. Atypical chest pain   ?4. Mixed hyperlipidemia   ? ?PLAN:   ? ?In order of problems listed above: ? ?Essential hypertension still not well controlled after some medication she declined she said she we will try to BL be more active and see if she can get her blood pressure better under control ?Peripheral vascular disease because of the fact she refused to take any cholesterol medication I will schedule her to have carotic ultrasounds make sure there is no rapid progression of her coronary, arterial disease. ?Atypical chest pain denies having any, ?Mixed dyslipidemia I did review K PN which show me her LDL of 156 HDL 71 again we had discussion about potentially taking cholesterol medication and again she refused.  She think she will be able to get her cholesterol control with medications and I told her straight to very much.  She is still reluctant to take anything ? ? ?Medication Adjustments/Labs and Tests Ordered: ?Current medicines are reviewed at length with the patient today.  Concerns regarding medicines are outlined above.  ?No orders  of the defined types were placed in this encounter. ? ?Medication changes: No orders of the defined types were placed in this encounter. ? ? ?Signed, ?Park Liter, MD, [facility] ?09/28/2021 4:46 PM    ?[place] ?

## 2021-09-28 NOTE — Patient Instructions (Signed)
Medication Instructions:  Your physician recommends that you continue on your current medications as directed. Please refer to the Current Medication list given to you today.  *If you need a refill on your cardiac medications before your next appointment, please call your pharmacy*   Lab Work: None Ordered If you have labs (blood work) drawn today and your tests are completely normal, you will receive your results only by: MyChart Message (if you have MyChart) OR A paper copy in the mail If you have any lab test that is abnormal or we need to change your treatment, we will call you to review the results.   Testing/Procedures: Your physician has requested that you have a carotid duplex. This test is an ultrasound of the carotid arteries in your neck. It looks at blood flow through these arteries that supply the brain with blood. Allow one hour for this exam. There are no restrictions or special instructions.    Follow-Up: At [facility], you and your health needs are our priority.  As part of our continuing mission to provide you with exceptional heart care, we have created designated Provider Care Teams.  These Care Teams include your primary Cardiologist (physician) and Advanced Practice Providers (APPs -  Physician Assistants and Nurse Practitioners) who all work together to provide you with the care you need, when you need it.  We recommend signing up for the patient portal called "MyChart".  Sign up information is provided on this After Visit Summary.  MyChart is used to connect with patients for Virtual Visits (Telemedicine).  Patients are able to view lab/test results, encounter notes, upcoming appointments, etc.  Non-urgent messages can be sent to your provider as well.   To learn more about what you can do with MyChart, go to [email or web address].    Your next appointment:   6 month(s)  The format for your next appointment:   In Person  Provider:   Robert Krasowski, MD     Other Instructions NA  

## 2021-10-03 DIAGNOSIS — M25361 Other instability, right knee: Secondary | ICD-10-CM | POA: Diagnosis not present

## 2021-10-03 DIAGNOSIS — I10 Essential (primary) hypertension: Secondary | ICD-10-CM | POA: Diagnosis not present

## 2021-10-03 DIAGNOSIS — M170 Bilateral primary osteoarthritis of knee: Secondary | ICD-10-CM | POA: Diagnosis not present

## 2021-10-10 ENCOUNTER — Encounter: Payer: Self-pay | Admitting: Legal Medicine

## 2021-10-10 ENCOUNTER — Ambulatory Visit (INDEPENDENT_AMBULATORY_CARE_PROVIDER_SITE_OTHER): Payer: Medicare Other | Admitting: Legal Medicine

## 2021-10-10 VITALS — BP 150/55 | HR 60 | Temp 99.1°F | Resp 15 | Ht 61.5 in | Wt 197.0 lb

## 2021-10-10 DIAGNOSIS — I10 Essential (primary) hypertension: Secondary | ICD-10-CM

## 2021-10-10 DIAGNOSIS — N3000 Acute cystitis without hematuria: Secondary | ICD-10-CM | POA: Diagnosis not present

## 2021-10-10 LAB — POCT URINALYSIS DIP (CLINITEK)
Bilirubin, UA: NEGATIVE
Glucose, UA: NEGATIVE mg/dL
Ketones, POC UA: NEGATIVE mg/dL
Nitrite, UA: NEGATIVE
POC PROTEIN,UA: NEGATIVE
Spec Grav, UA: 1.01 (ref 1.010–1.025)
Urobilinogen, UA: 0.2 E.U./dL
pH, UA: 6 (ref 5.0–8.0)

## 2021-10-10 MED ORDER — NITROFURANTOIN MONOHYD MACRO 100 MG PO CAPS: 100.0000 mg | ORAL_CAPSULE | Freq: Two times a day (BID) | ORAL | 0 refills | Status: DC

## 2021-10-10 MED ORDER — METOPROLOL SUCCINATE ER 25 MG PO TB24: 25.0000 mg | ORAL_TABLET | Freq: Every day | ORAL | 3 refills | Status: DC

## 2021-10-10 NOTE — Patient Instructions (Signed)
Low-Sodium Eating Plan Sodium, which is an element that makes up salt, helps you maintain a healthy balance of fluids in your body. Too much sodium can increase your blood pressure and cause fluid and waste to be held in your body. Your health care provider or dietitian may recommend following this plan if you have high blood pressure (hypertension), kidney disease, liver disease, or heart failure. Eating less sodium can help lower your blood pressure, reduce swelling, and protect your heart, liver, and kidneys. What are tips for following this plan? Reading food labels The Nutrition Facts label lists the amount of sodium in one serving of the food. If you eat more than one serving, you must multiply the listed amount of sodium by the number of servings. Choose foods with less than 140 mg of sodium per serving. Avoid foods with 300 mg of sodium or more per serving. Shopping  Look for lower-sodium products, often labeled as "low-sodium" or "no salt added." Always check the sodium content, even if foods are labeled as "unsalted" or "no salt added." Buy fresh foods. Avoid canned foods and pre-made or frozen meals. Avoid canned, cured, or processed meats. Buy breads that have less than 80 mg of sodium per slice. Cooking  Eat more home-cooked food and less restaurant, buffet, and fast food. Avoid adding salt when cooking. Use salt-free seasonings or herbs instead of table salt or sea salt. Check with your health care provider or pharmacist before using salt substitutes. Cook with plant-based oils, such as canola, sunflower, or olive oil. Meal planning When eating at a restaurant, ask that your food be prepared with less salt or no salt, if possible. Avoid dishes labeled as brined, pickled, cured, smoked, or made with soy sauce, miso, or teriyaki sauce. Avoid foods that contain MSG (monosodium glutamate). MSG is sometimes added to Chinese food, bouillon, and some canned foods. Make meals that can  be grilled, baked, poached, roasted, or steamed. These are generally made with less sodium. General information Most people on this plan should limit their sodium intake to 1,500-2,000 mg (milligrams) of sodium each day. What foods should I eat? Fruits Fresh, frozen, or canned fruit. Fruit juice. Vegetables Fresh or frozen vegetables. "No salt added" canned vegetables. "No salt added" tomato sauce and paste. Low-sodium or reduced-sodium tomato and vegetable juice. Grains Low-sodium cereals, including oats, puffed wheat and rice, and shredded wheat. Low-sodium crackers. Unsalted rice. Unsalted pasta. Low-sodium bread. Whole-grain breads and whole-grain pasta. Meats and other proteins Fresh or frozen (no salt added) meat, poultry, seafood, and fish. Low-sodium canned tuna and salmon. Unsalted nuts. Dried peas, beans, and lentils without added salt. Unsalted canned beans. Eggs. Unsalted nut butters. Dairy Milk. Soy milk. Cheese that is naturally low in sodium, such as ricotta cheese, fresh mozzarella, or Swiss cheese. Low-sodium or reduced-sodium cheese. Cream cheese. Yogurt. Seasonings and condiments Fresh and dried herbs and spices. Salt-free seasonings. Low-sodium mustard and ketchup. Sodium-free salad dressing. Sodium-free light mayonnaise. Fresh or refrigerated horseradish. Lemon juice. Vinegar. Other foods Homemade, reduced-sodium, or low-sodium soups. Unsalted popcorn and pretzels. Low-salt or salt-free chips. The items listed above may not be a complete list of foods and beverages you can eat. Contact a dietitian for more information. What foods should I avoid? Vegetables Sauerkraut, pickled vegetables, and relishes. Olives. French fries. Onion rings. Regular canned vegetables (not low-sodium or reduced-sodium). Regular canned tomato sauce and paste (not low-sodium or reduced-sodium). Regular tomato and vegetable juice (not low-sodium or reduced-sodium). Frozen vegetables in  sauces. Grains   Instant hot cereals. Bread stuffing, pancake, and biscuit mixes. Croutons. Seasoned rice or pasta mixes. Noodle soup cups. Boxed or frozen macaroni and cheese. Regular salted crackers. Self-rising flour. Meats and other proteins Meat or fish that is salted, canned, smoked, spiced, or pickled. Precooked or cured meat, such as sausages or meat loaves. Bacon. Ham. Pepperoni. Hot dogs. Corned beef. Chipped beef. Salt pork. Jerky. Pickled herring. Anchovies and sardines. Regular canned tuna. Salted nuts. Dairy Processed cheese and cheese spreads. Hard cheeses. Cheese curds. Blue cheese. Feta cheese. String cheese. Regular cottage cheese. Buttermilk. Canned milk. Fats and oils Salted butter. Regular margarine. Ghee. Bacon fat. Seasonings and condiments Onion salt, garlic salt, seasoned salt, table salt, and sea salt. Canned and packaged gravies. Worcestershire sauce. Tartar sauce. Barbecue sauce. Teriyaki sauce. Soy sauce, including reduced-sodium. Steak sauce. Fish sauce. Oyster sauce. Cocktail sauce. Horseradish that you find on the shelf. Regular ketchup and mustard. Meat flavorings and tenderizers. Bouillon cubes. Hot sauce. Pre-made or packaged marinades. Pre-made or packaged taco seasonings. Relishes. Regular salad dressings. Salsa. Other foods Salted popcorn and pretzels. Corn chips and puffs. Potato and tortilla chips. Canned or dried soups. Pizza. Frozen entrees and pot pies. The items listed above may not be a complete list of foods and beverages you should avoid. Contact a dietitian for more information. Summary Eating less sodium can help lower your blood pressure, reduce swelling, and protect your heart, liver, and kidneys. Most people on this plan should limit their sodium intake to 1,500-2,000 mg (milligrams) of sodium each day. Canned, boxed, and frozen foods are high in sodium. Restaurant foods, fast foods, and pizza are also very high in sodium. You also get sodium by  adding salt to food. Try to cook at home, eat more fresh fruits and vegetables, and eat less fast food and canned, processed, or prepared foods. This information is not intended to replace advice given to you by your health care provider. Make sure you discuss any questions you have with your health care provider. Document Revised: 07/02/2019 Document Reviewed: 04/28/2019 Elsevier Patient Education  2023 Else[employer] Inc.  

## 2021-10-10 NOTE — Progress Notes (Signed)
? ?Subjective:  ?Patient ID: Ashley Ritter, female    DOB: Oct 05, 1940  Age: 81 y.o. MRN: [id number] ? ?Chief Complaint  ?Patient presents with  ? Hypertension  ? ? ?HPI ? Patient is here for Hypertension. She does  not check blood pressure at home . She is taking triamterene-hctz 37.5-25 mg daily. No chest pain. We will add Beta blocker. ? ?Current Outpatient Medications on File Prior to Visit  ?Medication Sig Dispense Refill  ? aspirin EC 81 MG tablet Take 81 mg by mouth daily. Swallow whole.    ? ferrous fumarate-iron polysaccharide complex (TANDEM) 162-115.2 MG CAPS capsule Take 1 capsule by mouth daily.    ? triamterene-hydrochlorothiazide (MAXZIDE-25) 37.5-25 MG tablet Take 1 tablet by mouth daily. 90 tablet 3  ? VITAMIN D PO Take 1 tablet by mouth daily. Unknown strength    ? ?No current facility-administered medications on file prior to visit.  ? ?Past Medical History:  ?Diagnosis Date  ? AKI (acute kidney injury) (HCC) 11/03/2016  ? Atypical chest pain   ? Carotid bruit   ? Dyslipidemia 12/27/2014  ? Essential hypertension 12/27/2014  ? H/O vertigo 11/03/2016  ? History of blood transfusion   ? 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 Hyperlipidemia   ? Hypertension   ? Hypotension 11/03/2016  ? Iron deficiency anemia   ? OA (osteoarthritis) of hip 04/23/2017  ? Peripheral vascular disease (HCC) 04/09/2017  ? Carotid arterial disease  ? 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 Syncope 11/03/2016  ? Vertigo   ? ?Past Surgical History:  ?Procedure Laterality Date  ? ABDOMINAL HYSTERECTOMY    ? GASTRIC RESTRICTION SURGERY    ? SHOULDER SURGERY    ? TONSILLECTOMY    ? TOTAL HIP ARTHROPLASTY Right 04/23/2017  ? Procedure: RIGHT TOTAL HIP ARTHROPLASTY ANTERIOR APPROACH;  Surgeon: Ollen Gross, MD;  Location: [facility];  Service: Orthopedics;  Laterality: Right;  ? TOTAL HIP ARTHROPLASTY Left 03/29/2020  ? Procedure: TOTAL HIP ARTHROPLASTY ANTERIOR APPROACH;  Surgeon: Ollen Gross, MD;  Location: [facility];  Service: Orthopedics;  Laterality: Left;   ? TUBAL LIGATION    ?  ?Family History  ?Problem Relation Age of Onset  ? Diabetes Mother   ? Heart attack Father   ? Heart disease Father   ? Hyperlipidemia Brother   ? Hypertension Brother   ? ?Social History  ? ?Socioeconomic History  ? Marital status: Married  ?  Spouse name: Not on file  ? Number of children: Not on file  ? Years of education: Not on file  ? Highest education level: Not on file  ?Occupational History  ? Not on file  ?Tobacco Use  ? Smoking status: Former  ?  Types: Cigarettes  ?  Quit date: 84  ?  Years since quitting: 40.3  ? Smokeless tobacco: Never  ?Vaping Use  ? Vaping Use: Never used  ?Substance and Sexual Activity  ? Alcohol use: No  ? Drug use: No  ? Sexual activity: Not Currently  ?Other Topics Concern  ? Not on file  ?Social History Narrative  ? Not on file  ? ?Social Determinants of Health  ? ?Financial Resource Strain: Not on file  ?Food Insecurity: Not on file  ?Transportation Needs: Not on file  ?Physical Activity: Not on file  ?Stress: Not on file  ?Social Connections: Not on file  ? ? ?Review of Systems  ?Constitutional:  Negative for chills, fatigue  and fever.  ?HENT:  Negative for congestion, ear pain and sore throat.   ?Respiratory:  Negative for cough and shortness of breath.   ?Cardiovascular:  Negative for chest pain and palpitations.  ?Gastrointestinal:  Negative for abdominal pain, constipation, diarrhea, nausea and vomiting.  ?Endocrine: Negative for polydipsia, polyphagia and polyuria.  ?Genitourinary:  Negative for difficulty urinating and dysuria.  ?Musculoskeletal:  Negative for arthralgias, back pain and myalgias.  ?Skin:  Negative for rash.  ?Neurological:  Negative for headaches.  ?Psychiatric/Behavioral:  Negative for dysphoric mood. The patient is not nervous/anxious.   ? ? ?Objective:  ?BP (!) 150/55   Pulse 60   Temp 99.1 ?F (37.3 ?C)   Resp 15   Ht 5' 1.5" (1.562 m)   Wt  197 lb (89.4 kg)   SpO2 99%   BMI 36.62 kg/m?  ? ? ?  10/10/2021  ?  2:25 PM 09/28/2021  ?  4:28 PM 09/24/2021  ?  2:06 PM  ?BP/Weight  ?Systolic BP 150 140 124  ?Diastolic BP 55 90 60  ?Wt. (Lbs) 197 191.2 189  ?BMI 36.62 kg/m2 35.54 kg/m2 35.71 kg/m2  ? ? ?Physical Exam ?Vitals reviewed.  ?Constitutional:   ?   Appearance: Normal appearance.  ?HENT:  ?   Head: Normocephalic.  ?   Right Ear: Tympanic membrane normal.  ?   Left Ear: Tympanic membrane normal.  ?   Mouth/Throat:  ?   Mouth: Mucous membranes are moist.  ?   Pharynx: Oropharynx is clear.  ?Eyes:  ?   Extraocular Movements: Extraocular movements intact.  ?   Conjunctiva/sclera: Conjunctivae normal.  ?   Pupils: Pupils are equal, round, and reactive to light.  ?Cardiovascular:  ?   Rate and Rhythm: Normal rate and regular rhythm.  ?   Pulses: Normal pulses.  ?   Heart sounds: Normal heart sounds. No murmur heard. ?  No gallop.  ?Pulmonary:  ?   Effort: Pulmonary effort is normal. No respiratory distress.  ?   Breath sounds: Normal breath sounds. No wheezing.  ?Abdominal:  ?   General: Abdomen is flat. Bowel sounds are normal. There is no distension.  ?   Tenderness: There is no abdominal tenderness.  ?Musculoskeletal:  ?   Cervical back: Normal range of motion.  ?Skin: ?   General: Skin is warm.  ?   Capillary Refill: Capillary refill takes less than 2 seconds.  ?Neurological:  ?   General: No focal deficit present.  ?   Mental Status: She is alert. Mental status is at baseline.  ? ? ? ?  ? ?Lab Results  ?Component Value Date  ? WBC 5.6 09/11/2021  ? HGB 10.1 (A) 09/11/2021  ? HCT 32 (A) 09/11/2021  ? PLT 300 09/11/2021  ? GLUCOSE 102 (H) 12/28/2020  ? CHOL 237 (H) 12/28/2020  ? TRIG 58 12/28/2020  ? HDL 71 12/28/2020  ? LDLCALC 156 (H) 12/28/2020  ? ALT 12 09/11/2021  ? AST 24 09/11/2021  ? NA 139 09/11/2021  ? K 4.0 09/11/2021  ? CL 107 09/11/2021  ? CREATININE 1.1 09/11/2021  ? BUN 20 09/11/2021  ? CO2 28 (A) 09/11/2021  ? TSH 0.782 06/16/2020  ? INR 1.0  03/21/2020  ? ? ? ? ?Assessment & Plan:  ? ?Problem List Items Addressed This Visit   ? ?  ? Cardiovascular and Mediastinum  ? Essential hypertension - Primary  ? Relevant Medications  ? metoprolol succinate (TOPROL-XL) 25 MG 24 hr  tablet ?BP still elevated , add metoprolol 25mg  XL qd to dyazide, recheck in 4 weeks  ?  ? Genitourinary  ? Acute cystitis without hematuria  ? Relevant Medications  ? nitrofurantoin, macrocrystal-monohydrate, (MACROBID) 100 MG capsule  ? Other Relevant Orders  ? POCT URINALYSIS DIP (CLINITEK) (Completed)  ? Urine Culture ?UTI , treat with macrobid, culture urine  ?. ? ? ? ?  ? ?Follow-up: Return in about 1 month (around 11/10/2021), or with nurse, for hypertension. ? ?An After Visit Summary was printed and given to the patient. ? ?01/10/2022, MD ?[facility] ?[phone]

## 2021-10-14 LAB — URINE CULTURE

## 2021-10-15 DIAGNOSIS — K921 Melena: Secondary | ICD-10-CM | POA: Diagnosis not present

## 2021-10-16 ENCOUNTER — Ambulatory Visit (INDEPENDENT_AMBULATORY_CARE_PROVIDER_SITE_OTHER): Payer: Medicare Other

## 2021-10-16 DIAGNOSIS — I6523 Occlusion and stenosis of bilateral carotid arteries: Secondary | ICD-10-CM

## 2021-10-16 NOTE — Progress Notes (Signed)
Urine culture E. Coli sensitive for macrobid ?lp

## 2021-10-17 ENCOUNTER — Telehealth: Payer: Self-pay

## 2021-10-17 NOTE — Telephone Encounter (Signed)
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  Medical therapy ?

## 2021-10-17 NOTE — Telephone Encounter (Signed)
Patient notified of results and asked if there is anything she can do to prevent progressing. Please advise. ? ?Message sent to Dr. Agustin Cree ?

## 2021-10-23 ENCOUNTER — Telehealth: Payer: Self-pay

## 2021-10-23 DIAGNOSIS — E782 Mixed hyperlipidemia: Secondary | ICD-10-CM

## 2021-10-23 DIAGNOSIS — I10 Essential (primary) hypertension: Secondary | ICD-10-CM

## 2021-10-23 NOTE — Telephone Encounter (Signed)
Patient notified of the following per Dr. Agustin Cree. She asked to be referred to a dietician to education/weight management. Referral sent message Dr. Agustin Cree.  ?

## 2021-10-23 NOTE — Telephone Encounter (Signed)
-----   Message from Park Liter, MD sent at 10/19/2021 12:49 PM EDT ----- ?Yes, taking care of high blood pressure cholesterol and sugar if we take care of this that should not progress ?----- Message ----- ?From: Darrel Reach, CMA ?Sent: 10/17/2021   4:26 PM EDT ?To: Park Liter, MD ? ?Patient notified of results and asked if there is anything she can do to prevent progressing. Please advise. ?----- Message ----- ?From: Park Liter, MD ?Sent: 10/17/2021   1:45 PM EDT ?To: Tyler Pita, RN ? 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  Medical therapy ? ? ?

## 2021-10-24 ENCOUNTER — Telehealth: Payer: Self-pay

## 2021-10-24 NOTE — Telephone Encounter (Signed)
Called patient to schedule AWV- unable to reach the patient by phone.  I have left her a message requesting she call to schedule it.  ? ?Gentry Fitz, RN, [place], [place], CDE ?10/24/2021 3:52 PM ? ?

## 2021-11-03 DIAGNOSIS — M25561 Pain in right knee: Secondary | ICD-10-CM | POA: Diagnosis not present

## 2021-11-03 DIAGNOSIS — I10 Essential (primary) hypertension: Secondary | ICD-10-CM | POA: Diagnosis not present

## 2021-11-03 DIAGNOSIS — Z96642 Presence of left artificial hip joint: Secondary | ICD-10-CM | POA: Diagnosis not present

## 2021-11-09 ENCOUNTER — Other Ambulatory Visit: Payer: Self-pay | Admitting: Family Medicine

## 2021-11-09 ENCOUNTER — Ambulatory Visit: Payer: Medicare Other

## 2021-11-09 DIAGNOSIS — E782 Mixed hyperlipidemia: Secondary | ICD-10-CM

## 2021-11-09 DIAGNOSIS — I10 Essential (primary) hypertension: Secondary | ICD-10-CM

## 2021-11-09 MED ORDER — METOPROLOL SUCCINATE ER 25 MG PO TB24: 25.0000 mg | ORAL_TABLET | Freq: Two times a day (BID) | ORAL | 0 refills | Status: DC

## 2021-11-09 NOTE — Progress Notes (Signed)
PATIENT'S BP IS STILL HIGH EVEN WITH ADDING METOPROLOL, SO PATIENT WAS INSTRUCTED TO INCREASE HER METOPROLOL PER DR [facility] TO 25 MG 1 TABLET TWICE DAILY INSTEAD OF ONCE DAILY. PATIENT AGREED AND HAS SCHEDULED UP A FOLLOW UP WITH DR [facility] IN 3-4 WEEKS TO RECHECK AND DISCUSS FLUID ISSUES.

## 2021-11-10 LAB — LIPID PANEL
Chol/HDL Ratio: 3 ratio (ref 0.0–4.4)
Cholesterol, Total: 224 mg/dL — ABNORMAL HIGH (ref 100–199)
HDL: 74 mg/dL (ref >39)
LDL Chol Calc (NIH): 139 mg/dL — ABNORMAL HIGH (ref 0–99)
Triglycerides: 66 mg/dL (ref 0–149)
VLDL Cholesterol Cal: 11 mg/dL (ref 5–40)

## 2021-11-10 LAB — CARDIOVASCULAR RISK ASSESSMENT

## 2021-11-14 DIAGNOSIS — Z96642 Presence of left artificial hip joint: Secondary | ICD-10-CM | POA: Diagnosis not present

## 2021-11-14 DIAGNOSIS — I10 Essential (primary) hypertension: Secondary | ICD-10-CM | POA: Diagnosis not present

## 2021-12-03 ENCOUNTER — Ambulatory Visit (INDEPENDENT_AMBULATORY_CARE_PROVIDER_SITE_OTHER): Payer: Medicare Other | Admitting: Family Medicine

## 2021-12-03 ENCOUNTER — Encounter: Payer: Self-pay | Admitting: Family Medicine

## 2021-12-03 VITALS — BP 136/60 | HR 61 | Temp 97.0°F | Resp 16 | Ht 61.5 in | Wt 193.2 lb

## 2021-12-03 DIAGNOSIS — I10 Essential (primary) hypertension: Secondary | ICD-10-CM

## 2021-12-03 DIAGNOSIS — Z96642 Presence of left artificial hip joint: Secondary | ICD-10-CM | POA: Diagnosis not present

## 2021-12-03 NOTE — Assessment & Plan Note (Signed)
Improved.  Continue dyazide.

## 2021-12-31 DIAGNOSIS — H40013 Open angle with borderline findings, low risk, bilateral: Secondary | ICD-10-CM | POA: Diagnosis not present

## 2022-01-02 DIAGNOSIS — I10 Essential (primary) hypertension: Secondary | ICD-10-CM | POA: Diagnosis not present

## 2022-01-02 DIAGNOSIS — M170 Bilateral primary osteoarthritis of knee: Secondary | ICD-10-CM | POA: Diagnosis not present

## 2022-01-02 DIAGNOSIS — M25562 Pain in left knee: Secondary | ICD-10-CM | POA: Diagnosis not present

## 2022-01-24 DIAGNOSIS — Z1231 Encounter for screening mammogram for malignant neoplasm of breast: Secondary | ICD-10-CM | POA: Diagnosis not present

## 2022-01-24 LAB — HM MAMMOGRAPHY

## 2022-01-25 ENCOUNTER — Encounter: Payer: Self-pay | Admitting: Legal Medicine

## 2022-01-29 ENCOUNTER — Ambulatory Visit (INDEPENDENT_AMBULATORY_CARE_PROVIDER_SITE_OTHER): Payer: Medicare Other

## 2022-01-29 VITALS — BP 124/68 | HR 64 | Resp 16 | Ht 61.5 in | Wt 191.2 lb

## 2022-01-29 DIAGNOSIS — Z9189 Other specified personal risk factors, not elsewhere classified: Secondary | ICD-10-CM | POA: Diagnosis not present

## 2022-01-29 DIAGNOSIS — Z2821 Immunization not carried out because of patient refusal: Secondary | ICD-10-CM | POA: Diagnosis not present

## 2022-01-29 DIAGNOSIS — N959 Unspecified menopausal and perimenopausal disorder: Secondary | ICD-10-CM

## 2022-01-29 DIAGNOSIS — Z0000 Encounter for general adult medical examination without abnormal findings: Secondary | ICD-10-CM

## 2022-01-31 NOTE — Progress Notes (Signed)
Subjective:   Ashley Ritter is a 81 y.o. female who presents for Medicare Annual (Subsequent) preventive examination.  This wellness visit is conducted by a nurse.  The patient's medications were reviewed and reconciled since the patient's last visit.  History details were provided by the patient.  The history appears to be reliable.    Medical History: Patient history and Family history was reviewed  Medications, Allergies, and preventative health maintenance was reviewed and updated.  Cardiac Risk Factors include: advanced age (>48men, >103 women);obesity (BMI >30kg/m2)     Objective:    Today's Vitals   01/29/22 1102 01/29/22 1554  BP:  124/68  Pulse:  64  Resp:  16  SpO2:  98%  Weight:  191 lb 3.2 oz (86.7 kg)  Height:  5' 1.5" (1.562 m)  PainSc: 0-No pain 0-No pain   Body mass index is 35.54 kg/m.     01/31/2022    3:26 PM 09/11/2021    2:26 PM 03/12/2021    2:28 PM 09/07/2020    4:22 PM 05/02/2020    1:42 PM 03/29/2020    3:25 PM 03/21/2020    1:26 PM  Advanced Directives  Does Patient Have a Medical Advance Directive? No No No No No No No  Would patient like information on creating a medical advance directive? Yes (MAU/Ambulatory/Procedural Areas - Information given)   No - Patient declined No - Patient declined No - Patient declined No - Patient declined    Current Medications (verified) Outpatient Encounter Medications as of 01/29/2022  Medication Sig   aspirin EC 81 MG tablet Take 81 mg by mouth daily. Swallow whole.   ferrous fumarate-iron polysaccharide complex (TANDEM) 162-115.2 MG CAPS capsule Take 1 capsule by mouth daily.   Garlic (GARLIQUE) 400 MG TBEC Take 400 mg by mouth.   triamterene-hydrochlorothiazide (MAXZIDE-25) 37.5-25 MG tablet Take 1 tablet by mouth daily.   VITAMIN D PO Take 1 tablet by mouth daily. Unknown strength   No facility-administered encounter medications on file as of 01/29/2022.    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   Syncope 11/03/2016   Vertigo    Past Surgical History:  Procedure Laterality Date   ABDOMINAL HYSTERECTOMY     GASTRIC RESTRICTION SURGERY     SHOULDER SURGERY     TONSILLECTOMY     TOTAL HIP ARTHROPLASTY Right 04/23/2017   Procedure: RIGHT TOTAL HIP ARTHROPLASTY ANTERIOR APPROACH;  Surgeon: Ollen Gross, MD;  Location: [facility];  Service: Orthopedics;  Laterality: Right;   TOTAL HIP ARTHROPLASTY Left 03/29/2020   Procedure: TOTAL HIP ARTHROPLASTY ANTERIOR APPROACH;  Surgeon: Ollen Gross, MD;  Location: [facility];  Service: Orthopedics;  Laterality: Left;    TUBAL LIGATION     Family History  Problem Relation Age of Onset   Diabetes Mother    Heart attack Father    Heart disease  Father    Hyperlipidemia Brother    Hypertension Brother    Social History   Socioeconomic History   Marital status: Single    Spouse name: Not on file   Number of children: Not on file   Years of education: Not on file   Highest education level: Not on file  Occupational History   Not on file  Tobacco Use   Smoking status: Former    Types: Cigarettes    Quit date: 1    Years since quitting: 40.6   Smokeless tobacco: Never  Vaping Use   Vaping Use: Never used  Substance and Sexual Activity   Alcohol use: No   Drug use: No   Sexual activity: Not Currently  Other Topics Concern   Not on file  Social History  Narrative   Not on file   Social Determinants of Health   Financial Resource Strain: Not on file  Food Insecurity: No Food Insecurity (01/31/2022)   Hunger Vital Sign    Worried About Running Out of Food in the Last Year: Never true    Ran Out of Food in the Last Year: Never true  Transportation Needs: No Transportation Needs (01/31/2022)   PRAPARE - Hydrologist (Medical): No    Lack of Transportation (Non-Medical): No  Physical Activity: Inactive (01/31/2022)   Exercise Vital Sign    Days of Exercise per Week: 0 days    Minutes of Exercise per Session: 0 min  Stress: No Stress Concern Present (01/31/2022)   [place]    Feeling of Stress : Only a little  Social Connections: Not on file    Tobacco Counseling Counseling given: Not Answered   Clinical Intake:  Pre-visit preparation completed: Yes Pain : No/denies pain Pain Score: 0-No pain   BMI - recorded: 35.54 Nutritional Status: BMI > 30  Obese Nutritional Risks: None Diabetes: No How often do you need to have someone help you when you read instructions, pamphlets, or other written materials from your doctor or pharmacy?: 1 - Never Interpreter Needed?: No    Activities of Daily Living    01/31/2022    3:27 PM  In your present state of health, do you have any difficulty performing the following activities:  Hearing? 0  Vision? 0  Difficulty concentrating or making decisions? 0  Walking or climbing stairs? 1  Dressing or bathing? 0  Doing errands, shopping? 0  Preparing Food and eating ? N  Using the Toilet? N  In the past six months, have you accidently leaked urine? N  Do you have problems with loss of bowel control? N  Managing your Medications? N  Managing your Finances? N  Housekeeping or managing your Housekeeping? N    Patient Care Team: Lillard Anes, MD as PCP - General (Family  Medicine) Park Liter, MD as PCP - Cardiology (Cardiology) Marice Potter, MD as Consulting Physician (Hematology and Oncology)  Indicate any recent Medical Services you may have received from other than [facility] providers in the past year (date may be approximate).     Assessment:   This is a routine wellness examination for Ashley Ritter.  Hearing/Vision screen No results found.  Dietary issues and exercise activities discussed: Current Exercise Habits: The patient does not participate in regular exercise at present, Exercise limited by: None identified   Goals Addressed             This  Visit's Progress    Patient Stated       Patient agrees to reach out to our office if she is experiencing a financial burden for possible assistance resources        Depression Screen    01/31/2022    2:52 PM 12/03/2021   11:19 AM 12/28/2020   11:04 AM 03/15/2020   11:14 AM  PHQ 2/9 Scores  PHQ - 2 Score 0 0 0 0    Fall Risk    01/31/2022    3:26 PM 12/03/2021   11:18 AM 12/28/2020   11:04 AM 03/15/2020   11:14 AM 02/06/2016    9:09 AM  Fall Risk   Falls in the past year? 1 1 0 0 No  Comment     Emmi Telephone Survey: data to providers prior to load  Number falls in past yr: 0 0 0 0   Injury with Fall? 0 0 0 0   Risk for fall due to : History of fall(s) No Fall Risks Impaired balance/gait    Follow up Falls evaluation completed;Education provided Falls evaluation completed Falls evaluation completed      FALL RISK PREVENTION PERTAINING TO THE HOME:  Any stairs in or around the home? Yes  5 steps with rail If so, are there any without handrails? No  Home free of loose throw rugs in walkways, pet beds, electrical cords, etc? Yes  Adequate lighting in your home to reduce risk of falls? Yes   ASSISTIVE DEVICES UTILIZED TO PREVENT FALLS:  Use of a cane, walker or w/c? Yes  Grab bars in the bathroom? No  Shower chair or bench in shower? No  Elevated toilet seat or a handicapped  toilet? Yes   Gait slow and steady with assistive device  Cognitive Function:        01/31/2022    3:28 PM  6CIT Screen  What Year? 0 points  What month? 0 points  What time? 0 points  Count back from 20 0 points  Months in reverse 2 points  Repeat phrase 2 points  Total Score 4 points    Immunizations Immunization History  Administered Date(s) Administered   Moderna Sars-Covid-2 Vaccination 09/08/2019, 09/23/2019   PFIZER(Purple Top)SARS-COV-2 Vaccination 03/23/2020    TDAP status: Due, Education has been provided regarding the importance of this vaccine. Advised may receive this vaccine at local pharmacy or Health Dept. Aware to provide a copy of the vaccination record if obtained from local pharmacy or Health Dept. Verbalized acceptance and understanding.  Flu Vaccine status: Declined, Education has been provided regarding the importance of this vaccine but patient still declined. Advised may receive this vaccine at local pharmacy or Health Dept. Aware to provide a copy of the vaccination record if obtained from local pharmacy or Health Dept. Verbalized acceptance and understanding.  Pneumococcal vaccine status: Declined,  Education has been provided regarding the importance of this vaccine but patient still declined. Advised may receive this vaccine at local pharmacy or Health Dept. Aware to provide a copy of the vaccination record if obtained from local pharmacy or Health Dept. Verbalized acceptance and understanding.   Covid-19 vaccine status: Information provided on how to obtain vaccines.   Qualifies for Shingles Vaccine? Yes   Zostavax completed No   Shingrix Completed?: No.    Education has been provided regarding the importance of this vaccine. Patient has been advised to call insurance company to determine out of pocket expense if they have not yet received this vaccine.  Advised may also receive vaccine at local pharmacy or Health Dept. Verbalized acceptance and  understanding.  Screening Tests Health Maintenance  Topic Date Due   TETANUS/TDAP  Never done   Zoster Vaccines- Shingrix (1 of 2) Never done   Pneumonia Vaccine 51+ Years old (1 - PCV) Never done   DEXA SCAN  Never done   INFLUENZA VACCINE  01/08/2022   MAMMOGRAM  01/25/2024   HPV VACCINES  Aged Out   COVID-19 Vaccine  Discontinued    Health Maintenance  Health Maintenance Due  Topic Date Due   TETANUS/TDAP  Never done   Zoster Vaccines- Shingrix (1 of 2) Never done   Pneumonia Vaccine 74+ Years old (1 - PCV) Never done   DEXA SCAN  Never done   INFLUENZA VACCINE  01/08/2022    Colorectal cancer screening: No longer required.   Mammogram status: Completed 01/24/22. Repeat every year  Bone Density status: Ordered at [place]. Pt provided with contact info and advised to call to schedule appt.  Lung Cancer Screening: (Low Dose CT Chest recommended if Age 2-80 years, 30 pack-year currently smoking OR have quit w/in 15years.) does not qualify.   Additional Screening:  Vision Screening: Recommended annual ophthalmology exams for early detection of glaucoma and other disorders of the eye. Is the patient up to date with their annual eye exam?  Yes   Dental Screening: Recommended annual dental exams for proper oral hygiene    Plan:    1- Flu vaccine recommended and declined 2- Shingrix Vaccine and Tetanus vaccine recommended - patient will get at the pharmacy 3- DEXA never done - order faxed to [facility] 4- Patient agrees to notify the office if she experiences a financial hardship and needs resources for assistance with utility expenses 5- Advance Directives discussed, patient educated on options, and printed information given  I have personally reviewed and noted the following in the patient's chart:   Medical and social history Use of alcohol, tobacco or illicit drugs  Current medications and supplements including opioid prescriptions.  Functional ability and  status Nutritional status Physical activity Advanced directives List of other physicians Hospitalizations, surgeries, and ER visits in previous 12 months Vitals Screenings to include cognitive, depression, and falls Referrals and appointments  In addition, I have reviewed and discussed with patient certain preventive protocols, quality metrics, and best practice recommendations. A written personalized care plan for preventive services as well as general preventive health recommendations were provided to patient.     Erie Noe, LPN   [id number]

## 2022-02-01 NOTE — Patient Instructions (Signed)
Ashley Ritter , Thank you for taking time to come for your Medicare Wellness Visit. I appreciate your ongoing commitment to your health goals. Please review the following plan we discussed and let me know if I can assist you in the future.   Screening recommendations/referrals: Colonoscopy: No longer required Mammogram: due 01/2023 Bone Density: Ordered from [facility] Recommended yearly ophthalmology/optometry visit for glaucoma screening and checkup Recommended yearly dental visit for hygiene and checkup  Vaccinations: Influenza vaccine: Declined Pneumococcal vaccine: Declined Tdap vaccine: Due - can get at pharmacy Shingles vaccine: Due - can get at pharmacy    Advanced directives: Discussed, please call me if you have any questions or need assistance completing forms   Preventive Care 65 Years and Older, Female   Preventive care refers to lifestyle choices and visits with your health care provider that can promote health and wellness.  What does preventive care include? A yearly physical exam. This is also called an annual well check. Dental exams once or twice a year. Routine eye exams. Ask your health care provider how often you should have your eyes checked. Personal lifestyle choices, including: Daily care of your teeth and gums. Regular physical activity. Eating a healthy diet. Avoiding tobacco and drug use. Limiting alcohol use. Practicing safe sex. Taking low-dose aspirin every day. Taking vitamin and mineral supplements as recommended by your health care provider.  What happens during an annual well check? The services and screenings done by your health care provider during your annual well check will depend on your age, overall health, lifestyle risk factors, and family history of disease.  Counseling Your health care provider may ask you questions about your: Alcohol use. Tobacco use. Drug use. Emotional well-being. Home and relationship well-being. Sexual  activity. Eating habits. History of falls. Memory and ability to understand (cognition). Work and work Astronomer. Reproductive health.  Screening You may have the following tests or measurements: Height, weight, and BMI. Blood pressure. Lipid and cholesterol levels. These may be checked every 5 years, or more frequently if you are over 54 years old. Skin check. Lung cancer screening. You may have this screening every year starting at age 73 if you have a 30-pack-year history of smoking and currently smoke or have quit within the past 15 years. Fecal occult blood test (FOBT) of the stool. You may have this test every year starting at age 29. Flexible sigmoidoscopy or colonoscopy. You may have a sigmoidoscopy every 5 years or a colonoscopy every 10 years starting at age 26. Hepatitis C blood test. Hepatitis B blood test. Sexually transmitted disease (STD) testing. Diabetes screening. This is done by checking your blood sugar (glucose) after you have not eaten for a while (fasting). You may have this done every 1-3 years. Bone density scan. This is done to screen for osteoporosis. You may have this done starting at age 47. Mammogram. This may be done every 1-2 years. Talk to your health care provider about how often you should have regular mammograms. Talk with your health care provider about your test results, treatment options, and if necessary, the need for more tests.  Vaccines Your health care provider may recommend certain vaccines, such as: Influenza vaccine. This is recommended every year. Tetanus, diphtheria, and acellular pertussis (Tdap, Td) vaccine. You may need a Td booster every 10 years. Zoster vaccine. You may need this after age 17. Pneumococcal 13-valent conjugate (PCV13) vaccine. One dose is recommended after age 44. Pneumococcal polysaccharide (PPSV23) vaccine. One dose is recommended after age  22. Talk to your health care provider about which screenings and vaccines  you need and how often you need them.  This information is not intended to replace advice given to you by your health care provider. Make sure you discuss any questions you have with your health care provider. Document Released: 06/23/2015 Document Revised: 02/14/2016 Document Reviewed: 03/28/2015 Elsevier Interactive Patient Education  2017 [employer] Prevention in the Home  Falls can cause injuries. They can happen to people of all ages. There are many things you can do to make your home safe and to help prevent falls.  What can I do on the outside of my home? Regularly fix the edges of walkways and driveways and fix any cracks. Remove anything that might make you trip as you walk through a door, such as a raised step or threshold. Trim any bushes or trees on the path to your home. Use bright outdoor lighting. Clear any walking paths of anything that might make someone trip, such as rocks or tools. Regularly check to see if handrails are loose or broken. Make sure that both sides of any steps have handrails. Any raised decks and porches should have guardrails on the edges. Have any leaves, snow, or ice cleared regularly. Use sand or salt on walking paths during winter. Clean up any spills in your garage right away. This includes oil or grease spills.  What can I do in the bathroom? Use night lights. Install grab bars by the toilet and in the tub and shower. Do not use towel bars as grab bars. Use non-skid mats or decals in the tub or shower. If you need to sit down in the shower, use a plastic, non-slip stool. Keep the floor dry. Clean up any water that spills on the floor as soon as it happens. Remove soap buildup in the tub or shower regularly. Attach bath mats securely with double-sided non-slip rug tape. Do not have throw rugs and other things on the floor that can make you trip.  What can I do in the bedroom? Use night lights. Make sure that you have a light by  your bed that is easy to reach. Do not use any sheets or blankets that are too big for your bed. They should not hang down onto the floor. Have a firm chair that has side arms. You can use this for support while you get dressed. Do not have throw rugs and other things on the floor that can make you trip.  What can I do in the kitchen? Clean up any spills right away. Avoid walking on wet floors. Keep items that you use a lot in easy-to-reach places. If you need to reach something above you, use a strong step stool that has a grab bar. Keep electrical cords out of the way. Do not use floor polish or wax that makes floors slippery. If you must use wax, use non-skid floor wax. Do not have throw rugs and other things on the floor that can make you trip.  What can I do with my stairs? Do not leave any items on the stairs. Make sure that there are handrails on both sides of the stairs and use them. Fix handrails that are broken or loose. Make sure that handrails are as long as the stairways. Check any carpeting to make sure that it is firmly attached to the stairs. Fix any carpet that is loose or worn. Avoid having throw rugs at the top or  bottom of the stairs. If you do have throw rugs, attach them to the floor with carpet tape. Make sure that you have a light switch at the top of the stairs and the bottom of the stairs. If you do not have them, ask someone to add them for you.  What else can I do to help prevent falls? Wear shoes that: Do not have high heels. Have rubber bottoms. Are comfortable and fit you well. Are closed at the toe. Do not wear sandals. If you use a stepladder: Make sure that it is fully opened. Do not climb a closed stepladder. Make sure that both sides of the stepladder are locked into place. Ask someone to hold it for you, if possible. Clearly mark and make sure that you can see: Any grab bars or handrails. First and last steps. Where the edge of each step is. Use  tools that help you move around (mobility aids) if they are needed. These include: Canes. Walkers. Scooters. Crutches. Turn on the lights when you go into a dark area. Replace any light bulbs as soon as they burn out. Set up your furniture so you have a clear path. Avoid moving your furniture around. If any of your floors are uneven, fix them. If there are any pets around you, be aware of where they are. Review your medicines with your doctor. Some medicines can make you feel dizzy. This can increase your chance of falling. Ask your doctor what other things that you can do to help prevent falls.  This information is not intended to replace advice given to you by your health care provider. Make sure you discuss any questions you have with your health care provider. Document Released: 03/23/2009 Document Revised: 11/02/2015 Document Reviewed: 07/01/2014 Elsevier Interactive Patient Education  2017 [employer].

## 2022-02-02 DIAGNOSIS — M170 Bilateral primary osteoarthritis of knee: Secondary | ICD-10-CM | POA: Diagnosis not present

## 2022-02-02 DIAGNOSIS — I10 Essential (primary) hypertension: Secondary | ICD-10-CM | POA: Diagnosis not present

## 2022-02-02 DIAGNOSIS — M25561 Pain in right knee: Secondary | ICD-10-CM | POA: Diagnosis not present

## 2022-03-07 ENCOUNTER — Ambulatory Visit (INDEPENDENT_AMBULATORY_CARE_PROVIDER_SITE_OTHER): Payer: PPO | Admitting: Family Medicine

## 2022-03-07 VITALS — BP 132/64 | HR 72 | Temp 97.2°F | Resp 16 | Ht 62.0 in | Wt 197.0 lb

## 2022-03-07 DIAGNOSIS — I6523 Occlusion and stenosis of bilateral carotid arteries: Secondary | ICD-10-CM

## 2022-03-07 DIAGNOSIS — R8290 Unspecified abnormal findings in urine: Secondary | ICD-10-CM | POA: Diagnosis not present

## 2022-03-07 DIAGNOSIS — N3000 Acute cystitis without hematuria: Secondary | ICD-10-CM

## 2022-03-07 DIAGNOSIS — I10 Essential (primary) hypertension: Secondary | ICD-10-CM

## 2022-03-07 DIAGNOSIS — I129 Hypertensive chronic kidney disease with stage 1 through stage 4 chronic kidney disease, or unspecified chronic kidney disease: Secondary | ICD-10-CM

## 2022-03-07 DIAGNOSIS — Z6836 Body mass index (BMI) 36.0-36.9, adult: Secondary | ICD-10-CM

## 2022-03-07 DIAGNOSIS — E785 Hyperlipidemia, unspecified: Secondary | ICD-10-CM

## 2022-03-07 DIAGNOSIS — D508 Other iron deficiency anemias: Secondary | ICD-10-CM

## 2022-03-07 DIAGNOSIS — N824 Other female intestinal-genital tract fistulae: Secondary | ICD-10-CM | POA: Diagnosis not present

## 2022-03-07 DIAGNOSIS — N1831 Chronic kidney disease, stage 3a: Secondary | ICD-10-CM

## 2022-03-07 LAB — POCT URINALYSIS DIP (CLINITEK)
Bilirubin, UA: NEGATIVE
Blood, UA: NEGATIVE
Glucose, UA: NEGATIVE mg/dL
Ketones, POC UA: NEGATIVE mg/dL
Nitrite, UA: NEGATIVE
POC PROTEIN,UA: NEGATIVE
Spec Grav, UA: 1.015 (ref 1.010–1.025)
Urobilinogen, UA: 0.2 E.U./dL
pH, UA: 6.5 (ref 5.0–8.0)

## 2022-03-07 NOTE — Progress Notes (Signed)
Subjective:  Patient ID: Ashley Ritter, female    DOB: [birth year]  Age: 81 y.o. MRN: [id number]  Chief Complaint  Patient presents with   Hyperlipidemia   Hypertension    HPI Hyperlipidemia: Last LDL 139.  Current medications: Eating healthy and exercising.   Anemia: sees Dr. Melvyn Neth. Iron deficiency. Is scheduled to see him next week.   Hypertension: Complications: CKD Current medications: triamterene/hct 37.5 mg/25 mg once daily.  BP 150/55 on her device. On ours 132/64.   Fistula: colovaginal fistula. Patient does not want to get fixed. Would like to check urine. She does not have stool continuously coming into vagina. Denies any dysuria.    Current Outpatient Medications on File Prior to Visit  Medication Sig Dispense Refill   aspirin EC 81 MG tablet Take 81 mg by mouth daily. Swallow whole.     ferrous fumarate-iron polysaccharide complex (TANDEM) 162-115.2 MG CAPS capsule Take 1 capsule by mouth daily.     Garlic (GARLIQUE) 400 MG TBEC Take 400 mg by mouth.     triamterene-hydrochlorothiazide (MAXZIDE-25) 37.5-25 MG tablet Take 1 tablet by mouth daily. 90 tablet 3   VITAMIN D PO Take 1 tablet by mouth daily. Unknown strength     No current facility-administered medications on file prior to visit.   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   Syncope 11/03/2016   Vertigo    Past Surgical History:  Procedure Laterality Date   ABDOMINAL HYSTERECTOMY      GASTRIC RESTRICTION SURGERY     SHOULDER SURGERY     TONSILLECTOMY     TOTAL HIP ARTHROPLASTY Right 04/23/2017   Procedure: RIGHT TOTAL HIP ARTHROPLASTY ANTERIOR APPROACH;  Surgeon: Ollen Gross, MD;  Location: [facility];  Service: Orthopedics;  Laterality: Right;   TOTAL HIP ARTHROPLASTY Left 03/29/2020   Procedure: TOTAL HIP ARTHROPLASTY ANTERIOR APPROACH;  Surgeon: Ollen Gross, MD;  Location: [facility];  Service: Orthopedics;  Laterality: Left;    TUBAL LIGATION      Family History  Problem Relation Age of Onset   Diabetes Mother    Heart attack Father    Heart disease Father    Hyperlipidemia Brother    Hypertension Brother    Social History   Socioeconomic History   Marital status: Single    Spouse name: Not on file   Number of children: Not on file   Years of education: Not on file   Highest education level: Not on file  Occupational History   Not on file  Tobacco Use   Smoking status: Former    Types: Cigarettes    Quit date: 98    Years since quitting: 40.7   Smokeless tobacco: Never  Vaping Use   Vaping Use: Never used  Substance and Sexual Activity   Alcohol use: No   Drug use: No   Sexual activity: Not Currently  Other Topics Concern   Not on file  Social History Narrative   Not on file   Social Determinants of Health   Financial Resource Strain: Not on file  Food Insecurity: No Food Insecurity (01/31/2022)   Hunger Vital Sign    Worried About Running Out of Food in the Last Year: Never true    Ran Out of Food in the Last Year: Never true  Transportation Needs: No Transportation Needs (01/31/2022)   PRAPARE - Administrator, Civil Service (Medical): No    Lack of Transportation (Non-Medical): No  Physical Activity: Inactive (01/31/2022)   Exercise Vital Sign    Days of Exercise per Week: 0 days    Minutes of Exercise per Session: 0 min  Stress: No Stress Concern Present (01/31/2022)   [employer] of Occupational Health -  Occupational Stress Questionnaire    Feeling of Stress : Only a little  Social Connections: Not on file    Review of Systems  Constitutional:  Negative for chills, fatigue and fever.  HENT:  Negative for congestion, rhinorrhea and sore throat.   Respiratory:  Negative for cough and shortness of breath.   Cardiovascular:  Negative for chest pain.  Gastrointestinal:  Negative for abdominal pain, constipation, diarrhea, nausea and vomiting.  Genitourinary:  Negative for dysuria and urgency.  Musculoskeletal:  Positive for arthralgias (left knee pain). Negative for back pain and myalgias.  Neurological:  Negative for dizziness, weakness, light-headedness and headaches.  Psychiatric/Behavioral:  Negative for dysphoric mood. The patient is not nervous/anxious.      Objective:  BP 132/64   Pulse 72   Temp (!) 97.2 F (36.2 C)   Resp 16   Ht 5\' 2"  (1.575 m)   Wt 197 lb (89.4 kg)   BMI 36.03 kg/m      03/07/2022   11:11 AM 01/29/2022    3:54 PM 12/03/2021   11:14 AM  BP/Weight  Systolic BP 132 124 136  Diastolic BP 64 68 60  Wt. (Lbs) 197 191.2 193.2  BMI 36.03 kg/m2 35.54 kg/m2 35.91 kg/m2    Physical Exam Vitals reviewed.  Constitutional:      Appearance: Normal appearance. She is obese.  Neck:     Vascular: No carotid bruit.  Cardiovascular:     Rate and Rhythm: Normal rate and regular rhythm.     Heart sounds: Normal heart sounds.  Pulmonary:     Effort: Pulmonary effort is normal. No respiratory distress.     Breath sounds: Normal breath sounds.  Abdominal:     General: Abdomen is flat. Bowel sounds are normal.     Palpations: Abdomen is soft.     Tenderness: There is no abdominal tenderness.  Neurological:     Mental Status: She is alert and oriented to person, place, and time.  Psychiatric:        Mood and Affect: Mood normal.        Behavior: Behavior normal.     Diabetic Foot Exam - Simple   No data filed      Lab Results  Component Value Date    WBC 6.4 03/07/2022   HGB 10.8 (L) 03/07/2022   HCT 34.7 03/07/2022   PLT 323 03/07/2022   GLUCOSE 97 03/07/2022   CHOL 240 (H) 03/07/2022   TRIG 46 03/07/2022   HDL 83 03/07/2022   LDLCALC 150 (H) 03/07/2022   ALT 8 03/07/2022   AST 15 03/07/2022   NA 141 03/07/2022   K 4.1 03/07/2022   CL 101 03/07/2022  CREATININE 1.02 (H) 03/07/2022   BUN 18 03/07/2022   CO2 25 03/07/2022   TSH 0.782 06/16/2020   INR 1.0 03/21/2020      Assessment & Plan:   Problem List Items Addressed This Visit       Cardiovascular and Mediastinum   Bilateral carotid artery stenosis    Continue aspirin daily.  LDL goal less than 55.  Recommend statin.         Digestive   Colovaginal fistula    No treatment at this time. Patient prefers no surgery.       Relevant Orders   POCT URINALYSIS DIP (CLINITEK) (Completed)     Genitourinary   Hypertension, renal disease, stage 1-4 or unspecified chronic kidney disease    Well controlled.  No changes to medicines. Continue  triamterene/hct 37.5 mg/25 mg once daily.  Continue to work on eating a healthy diet and exercise.  Labs drawn today.        Relevant Orders   Comprehensive metabolic panel (Completed)   Cardiovascular Risk Assessment (Completed)   CKD (chronic kidney disease) stage 3, GFR 30-59 ml/min (HCC)    Stable. No nsaids.         Other   Dyslipidemia - Primary    Not at goal. Recommend start crestor 10 mg before bed. Continue to work on eating a healthy diet and exercise.  Labs drawn today.        Relevant Orders   Lipid panel (Completed)   Iron deficiency anemia    Improved.  Management per specialist.  Dr. Bobby Rumpf.       Relevant Orders   CBC with Differential/Platelet (Completed)   Iron, TIBC and Ferritin Panel (Completed)   Class 2 severe obesity due to excess calories with serious comorbidity and body mass index (BMI) of 36.0 to 36.9 in adult [facility])    Recommend continue to work on eating healthy diet and  exercise. comorbities incude hypertension and hyperlipidemia.       Abnormal urine    Check urine culture.       Relevant Orders   Urine Culture (Completed)  .  No orders of the defined types were placed in this encounter.   Orders Placed This Encounter  Procedures   Urine Culture   CBC with Differential/Platelet   Comprehensive metabolic panel   Iron, TIBC and Ferritin Panel   Lipid panel   Cardiovascular Risk Assessment   POCT URINALYSIS DIP (CLINITEK)     Follow-up: Return in about 6 months (around 09/05/2022) for chronic fasting.  An After Visit Summary was printed and given to the patient.  Rochel Brome, MD [facility] [phone]

## 2022-03-08 LAB — COMPREHENSIVE METABOLIC PANEL
ALT: 8 IU/L (ref 0–32)
AST: 15 IU/L (ref 0–40)
Albumin/Globulin Ratio: 1.4 (ref 1.2–2.2)
Albumin: 4.2 g/dL (ref 3.8–4.8)
Alkaline Phosphatase: 71 IU/L (ref 44–121)
BUN/Creatinine Ratio: 18 (ref 12–28)
BUN: 18 mg/dL (ref 8–27)
Bilirubin Total: 0.4 mg/dL (ref 0.0–1.2)
CO2: 25 mmol/L (ref 20–29)
Calcium: 9.8 mg/dL (ref 8.7–10.3)
Chloride: 101 mmol/L (ref 96–106)
Creatinine, Ser: 1.02 mg/dL — ABNORMAL HIGH (ref 0.57–1.00)
Globulin, Total: 3 g/dL (ref 1.5–4.5)
Glucose: 97 mg/dL (ref 70–99)
Potassium: 4.1 mmol/L (ref 3.5–5.2)
Sodium: 141 mmol/L (ref 134–144)
Total Protein: 7.2 g/dL (ref 6.0–8.5)
eGFR: 56 mL/min/{1.73_m2} — ABNORMAL LOW (ref >59)

## 2022-03-08 LAB — CBC WITH DIFFERENTIAL/PLATELET
Basophils Absolute: 0 10*3/uL (ref 0.0–0.2)
Basos: 1 %
EOS (ABSOLUTE): 0.2 10*3/uL (ref 0.0–0.4)
Eos: 3 %
Hematocrit: 34.7 % (ref 34.0–46.6)
Hemoglobin: 10.8 g/dL — ABNORMAL LOW (ref 11.1–15.9)
Immature Grans (Abs): 0 10*3/uL (ref 0.0–0.1)
Immature Granulocytes: 0 %
Lymphocytes Absolute: 2 10*3/uL (ref 0.7–3.1)
Lymphs: 31 %
MCH: 27.5 pg (ref 26.6–33.0)
MCHC: 31.1 g/dL — ABNORMAL LOW (ref 31.5–35.7)
MCV: 88 fL (ref 79–97)
Monocytes Absolute: 0.5 10*3/uL (ref 0.1–0.9)
Monocytes: 7 %
Neutrophils Absolute: 3.8 10*3/uL (ref 1.4–7.0)
Neutrophils: 58 %
Platelets: 323 10*3/uL (ref 150–450)
RBC: 3.93 x10E6/uL (ref 3.77–5.28)
RDW: 11.9 % (ref 11.7–15.4)
WBC: 6.4 10*3/uL (ref 3.4–10.8)

## 2022-03-08 LAB — IRON,TIBC AND FERRITIN PANEL
Ferritin: 57 ng/mL (ref 15–150)
Iron Saturation: 14 % — ABNORMAL LOW (ref 15–55)
Iron: 47 ug/dL (ref 27–139)
Total Iron Binding Capacity: 340 ug/dL (ref 250–450)
UIBC: 293 ug/dL (ref 118–369)

## 2022-03-08 LAB — LIPID PANEL
Chol/HDL Ratio: 2.9 ratio (ref 0.0–4.4)
Cholesterol, Total: 240 mg/dL — ABNORMAL HIGH (ref 100–199)
HDL: 83 mg/dL (ref >39)
LDL Chol Calc (NIH): 150 mg/dL — ABNORMAL HIGH (ref 0–99)
Triglycerides: 46 mg/dL (ref 0–149)
VLDL Cholesterol Cal: 7 mg/dL (ref 5–40)

## 2022-03-08 LAB — CARDIOVASCULAR RISK ASSESSMENT

## 2022-03-08 NOTE — Progress Notes (Signed)
Blood count abnormal.  Anemia has improved.  Iron a little low.  Patient to see Dr. Bobby Rumpf. Liver function normal.  Kidney function normal.  Thyroid function normal.  Cholesterol: Elevated.  LDL worsened to 150.  I would recommend a statin medication.  Crestor 10 mg nightly.

## 2022-03-09 ENCOUNTER — Encounter: Payer: Self-pay | Admitting: Family Medicine

## 2022-03-09 DIAGNOSIS — I6523 Occlusion and stenosis of bilateral carotid arteries: Secondary | ICD-10-CM | POA: Insufficient documentation

## 2022-03-09 DIAGNOSIS — N824 Other female intestinal-genital tract fistulae: Secondary | ICD-10-CM | POA: Insufficient documentation

## 2022-03-09 DIAGNOSIS — N183 Chronic kidney disease, stage 3 unspecified: Secondary | ICD-10-CM | POA: Insufficient documentation

## 2022-03-09 DIAGNOSIS — R8290 Unspecified abnormal findings in urine: Secondary | ICD-10-CM | POA: Insufficient documentation

## 2022-03-09 LAB — URINE CULTURE

## 2022-03-09 NOTE — Assessment & Plan Note (Signed)
Not at goal. Recommend start crestor 10 mg before bed. Continue to work on eating a healthy diet and exercise.  Labs drawn today.

## 2022-03-09 NOTE — Assessment & Plan Note (Addendum)
Recommend continue to work on eating healthy diet and exercise. comorbities incude hypertension and hyperlipidemia.

## 2022-03-09 NOTE — Assessment & Plan Note (Signed)
Continue aspirin daily.  LDL goal less than 55.  Recommend statin.

## 2022-03-09 NOTE — Assessment & Plan Note (Signed)
Improved.  Management per specialist.  Dr. Bobby Rumpf.

## 2022-03-09 NOTE — Assessment & Plan Note (Signed)
Stable. No nsaids.  ?

## 2022-03-09 NOTE — Assessment & Plan Note (Signed)
Check urine culture  

## 2022-03-09 NOTE — Assessment & Plan Note (Signed)
No treatment at this time. Patient prefers no surgery.

## 2022-03-09 NOTE — Assessment & Plan Note (Signed)
Well controlled.  No changes to medicines. Continue  triamterene/hct 37.5 mg/25 mg once daily.  Continue to work on eating a healthy diet and exercise.  Labs drawn today.

## 2022-03-13 NOTE — Progress Notes (Signed)
[facility]  [address] [place],  [place]  [address] [phone]  Clinic Day:  03/14/2022  Referring physician: Lillard Anes,*  HISTORY OF PRESENT ILLNESS:  The patient is a 81 y.o. 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  In the past, IV Feraheme was effective in replenishing her iron stores and improving her hemoglobin.  She comes in today to reassess her anemia.  Since her last visit, the patient has been doing fine.  She denies having increased fatigue or any overt forms of blood loss which concern her for recurrent iron deficiency anemia.  She continues to take 1 iron pill daily.  PHYSICAL EXAM:  Blood pressure 137/78, pulse 65, temperature 98.8 F (37.1 C), resp. rate 16, height 5' 2"  (1.575 m), weight 194 lb 1.6 oz (88 kg), SpO2 99 %. Wt Readings from Last 3 Encounters:  03/14/22 194 lb 1.6 oz (88 kg)  03/07/22 197 lb (89.4 kg)  01/29/22 191 lb 3.2 oz (86.7 kg)   Body mass index is 35.5 kg/m. Performance status (ECOG): 1 - Symptomatic but completely ambulatory Physical Exam Constitutional:      Appearance: Normal appearance. She is not ill-appearing.  HENT:     Mouth/Throat:     Mouth: Mucous membranes are moist.     Pharynx: Oropharynx is clear. No oropharyngeal exudate or posterior oropharyngeal erythema.  Cardiovascular:     Rate and Rhythm: Normal rate and regular rhythm.     Heart sounds: No murmur heard.    No friction rub. No gallop.  Pulmonary:     Effort: Pulmonary effort is normal. No respiratory distress.     Breath sounds: Normal breath sounds. No wheezing, rhonchi or rales.  Abdominal:     General: Bowel sounds are normal. There is no distension.     Palpations: Abdomen is soft. There is no mass.     Tenderness: There is no abdominal tenderness.  Musculoskeletal:        General: No swelling.     Right lower leg: No edema.      Left lower leg: No edema.  Lymphadenopathy:     Cervical: No cervical adenopathy.     Upper Body:     Right upper body: No supraclavicular or axillary adenopathy.     Left upper body: No supraclavicular or axillary adenopathy.     Lower Body: No right inguinal adenopathy. No left inguinal adenopathy.  Skin:    General: Skin is warm.     Coloration: Skin is not jaundiced.     Findings: No lesion or rash.  Neurological:     General: No focal deficit present.     Mental Status: She is alert and oriented to person, place, and time. Mental status is at baseline.  Psychiatric:        Mood and Affect: Mood normal.        Behavior: Behavior normal.        Thought Content: Thought content normal.    LABS:    Latest Reference Range & Units 03/07/22 11:44  WBC 3.4 - 10.8 x10E3/uL 6.4  RBC 3.77 - 5.28 x10E6/uL 3.93  Hemoglobin 11.1 - 15.9 g/dL 10.8 (L)  HCT 34.0 - 46.6 % 34.7  MCV 79 - 97 fL 88  MCH 26.6 - 33.0 pg 27.5  MCHC 31.5 - 35.7 g/dL 31.1 (L)  RDW 11.7 - 15.4 % 11.9  Platelets  150 - 450 x10E3/uL 323  (L): Data is abnormally low  Latest Reference Range & Units 03/07/22 11:44  Iron 27 - 139 ug/dL 47  UIBC 118 - 369 ug/dL 293  TIBC 250 - 450 ug/dL 340  Ferritin 15 - 150 ng/mL 57  Iron Saturation 15 - 55 % 14 (L)  (L): Data is abnormally low  Latest Reference Range & Units 03/07/22 11:44  Sodium 134 - 144 mmol/L 141  Potassium 3.5 - 5.2 mmol/L 4.1  Chloride 96 - 106 mmol/L 101  CO2 20 - 29 mmol/L 25  Glucose 70 - 99 mg/dL 97  BUN 8 - 27 mg/dL 18  Creatinine 0.57 - 1.00 mg/dL 1.02 (H)  Calcium 8.7 - 10.3 mg/dL 9.8  BUN/Creatinine Ratio 12 - 28  18  eGFR >59 mL/min/1.73 56 (L)  Alkaline Phosphatase 44 - 121 IU/L 71  Albumin 3.8 - 4.8 g/dL 4.2  Albumin/Globulin Ratio 1.2 - 2.2  1.4  AST 0 - 40 IU/L 15  ALT 0 - 32 IU/L 8  Total Protein 6.0 - 8.5 g/dL 7.2  Total Bilirubin 0.0 - 1.2 mg/dL 0.4  (H): Data is abnormally high (L): Data is abnormally low  ASSESSMENT & PLAN:   Assessment/Plan:  An 82 y.o. female with iron deficiency anemia.  When evaluating her recent labs, although not normal, her hemoglobin of 10.8 is the highest level she has had in a few years.  Clinically, she appears to be doing well.  I have no problems with her continuing to take an iron pill on a daily basis to help with the fortification of iron stores.  As she is stable from a hematologic standpoint, I will see her back in 6 months for repeat clinical assessment.  The patient understands all the plans discussed today and is in agreement with them.    Sofiah Lyne Macarthur Critchley, MD

## 2022-03-14 ENCOUNTER — Inpatient Hospital Stay: Payer: PPO

## 2022-03-14 ENCOUNTER — Inpatient Hospital Stay: Payer: PPO | Attending: Oncology | Admitting: Oncology

## 2022-03-14 ENCOUNTER — Telehealth: Payer: Self-pay | Admitting: Oncology

## 2022-03-14 ENCOUNTER — Other Ambulatory Visit: Payer: Self-pay | Admitting: Oncology

## 2022-03-14 VITALS — BP 137/78 | HR 65 | Temp 98.8°F | Resp 16 | Ht 62.0 in | Wt 194.1 lb

## 2022-03-14 DIAGNOSIS — D508 Other iron deficiency anemias: Secondary | ICD-10-CM | POA: Diagnosis not present

## 2022-03-14 NOTE — Telephone Encounter (Signed)
03/14/22 Next appt scheduled and confirmed with patient 

## 2022-06-10 ENCOUNTER — Other Ambulatory Visit: Payer: Self-pay | Admitting: Oncology

## 2022-06-11 NOTE — Telephone Encounter (Signed)
Called in new RX for Tandem iron to [facility] [address].  Spoke with Caryl Pina.

## 2022-06-14 ENCOUNTER — Ambulatory Visit (INDEPENDENT_AMBULATORY_CARE_PROVIDER_SITE_OTHER): Payer: Medicare Other

## 2022-06-14 DIAGNOSIS — Z23 Encounter for immunization: Secondary | ICD-10-CM

## 2022-06-19 DIAGNOSIS — M25562 Pain in left knee: Secondary | ICD-10-CM | POA: Diagnosis not present

## 2022-06-19 DIAGNOSIS — M25561 Pain in right knee: Secondary | ICD-10-CM | POA: Diagnosis not present

## 2022-06-19 DIAGNOSIS — M170 Bilateral primary osteoarthritis of knee: Secondary | ICD-10-CM | POA: Diagnosis not present

## 2022-06-19 DIAGNOSIS — R262 Difficulty in walking, not elsewhere classified: Secondary | ICD-10-CM | POA: Diagnosis not present

## 2022-06-27 DIAGNOSIS — M170 Bilateral primary osteoarthritis of knee: Secondary | ICD-10-CM | POA: Diagnosis not present

## 2022-06-27 DIAGNOSIS — R262 Difficulty in walking, not elsewhere classified: Secondary | ICD-10-CM | POA: Diagnosis not present

## 2022-06-27 DIAGNOSIS — M25562 Pain in left knee: Secondary | ICD-10-CM | POA: Diagnosis not present

## 2022-06-27 DIAGNOSIS — M25561 Pain in right knee: Secondary | ICD-10-CM | POA: Diagnosis not present

## 2022-07-03 DIAGNOSIS — R262 Difficulty in walking, not elsewhere classified: Secondary | ICD-10-CM | POA: Diagnosis not present

## 2022-07-03 DIAGNOSIS — M25561 Pain in right knee: Secondary | ICD-10-CM | POA: Diagnosis not present

## 2022-07-03 DIAGNOSIS — M170 Bilateral primary osteoarthritis of knee: Secondary | ICD-10-CM | POA: Diagnosis not present

## 2022-07-03 DIAGNOSIS — M25562 Pain in left knee: Secondary | ICD-10-CM | POA: Diagnosis not present

## 2022-07-11 DIAGNOSIS — M25562 Pain in left knee: Secondary | ICD-10-CM | POA: Diagnosis not present

## 2022-07-11 DIAGNOSIS — M170 Bilateral primary osteoarthritis of knee: Secondary | ICD-10-CM | POA: Diagnosis not present

## 2022-07-11 DIAGNOSIS — M25561 Pain in right knee: Secondary | ICD-10-CM | POA: Diagnosis not present

## 2022-07-11 DIAGNOSIS — R262 Difficulty in walking, not elsewhere classified: Secondary | ICD-10-CM | POA: Diagnosis not present

## 2022-07-18 DIAGNOSIS — M170 Bilateral primary osteoarthritis of knee: Secondary | ICD-10-CM | POA: Diagnosis not present

## 2022-07-18 DIAGNOSIS — R262 Difficulty in walking, not elsewhere classified: Secondary | ICD-10-CM | POA: Diagnosis not present

## 2022-07-18 DIAGNOSIS — M25562 Pain in left knee: Secondary | ICD-10-CM | POA: Diagnosis not present

## 2022-07-18 DIAGNOSIS — M25561 Pain in right knee: Secondary | ICD-10-CM | POA: Diagnosis not present

## 2022-07-19 ENCOUNTER — Other Ambulatory Visit: Payer: Self-pay

## 2022-07-19 DIAGNOSIS — I10 Essential (primary) hypertension: Secondary | ICD-10-CM

## 2022-07-19 MED ORDER — TRIAMTERENE-HCTZ 37.5-25 MG PO TABS: 1.0000 | ORAL_TABLET | Freq: Every day | ORAL | 3 refills | Status: DC

## 2022-07-22 DIAGNOSIS — Z0289 Encounter for other administrative examinations: Secondary | ICD-10-CM

## 2022-07-25 DIAGNOSIS — M25562 Pain in left knee: Secondary | ICD-10-CM | POA: Diagnosis not present

## 2022-07-25 DIAGNOSIS — M25561 Pain in right knee: Secondary | ICD-10-CM | POA: Diagnosis not present

## 2022-07-25 DIAGNOSIS — R262 Difficulty in walking, not elsewhere classified: Secondary | ICD-10-CM | POA: Diagnosis not present

## 2022-07-25 DIAGNOSIS — M170 Bilateral primary osteoarthritis of knee: Secondary | ICD-10-CM | POA: Diagnosis not present

## 2022-07-26 ENCOUNTER — Ambulatory Visit (INDEPENDENT_AMBULATORY_CARE_PROVIDER_SITE_OTHER): Payer: Medicare Other | Admitting: Family Medicine

## 2022-07-26 ENCOUNTER — Encounter: Payer: Self-pay | Admitting: Family Medicine

## 2022-07-26 VITALS — BP 122/70 | HR 64 | Temp 97.3°F | Ht 61.5 in | Wt 199.0 lb

## 2022-07-26 DIAGNOSIS — M1612 Unilateral primary osteoarthritis, left hip: Secondary | ICD-10-CM | POA: Diagnosis not present

## 2022-07-26 DIAGNOSIS — I10 Essential (primary) hypertension: Secondary | ICD-10-CM

## 2022-07-26 DIAGNOSIS — D508 Other iron deficiency anemias: Secondary | ICD-10-CM

## 2022-07-26 DIAGNOSIS — J018 Other acute sinusitis: Secondary | ICD-10-CM | POA: Diagnosis not present

## 2022-07-26 DIAGNOSIS — Z6836 Body mass index (BMI) 36.0-36.9, adult: Secondary | ICD-10-CM | POA: Diagnosis not present

## 2022-07-26 DIAGNOSIS — N1831 Chronic kidney disease, stage 3a: Secondary | ICD-10-CM

## 2022-07-26 DIAGNOSIS — I129 Hypertensive chronic kidney disease with stage 1 through stage 4 chronic kidney disease, or unspecified chronic kidney disease: Secondary | ICD-10-CM | POA: Diagnosis not present

## 2022-07-26 DIAGNOSIS — R300 Dysuria: Secondary | ICD-10-CM | POA: Diagnosis not present

## 2022-07-26 DIAGNOSIS — E782 Mixed hyperlipidemia: Secondary | ICD-10-CM

## 2022-07-26 DIAGNOSIS — M170 Bilateral primary osteoarthritis of knee: Secondary | ICD-10-CM | POA: Diagnosis not present

## 2022-07-26 DIAGNOSIS — Z0289 Encounter for other administrative examinations: Secondary | ICD-10-CM

## 2022-07-26 DIAGNOSIS — N824 Other female intestinal-genital tract fistulae: Secondary | ICD-10-CM

## 2022-07-26 LAB — POCT URINALYSIS DIP (CLINITEK)
Bilirubin, UA: NEGATIVE
Blood, UA: NEGATIVE
Glucose, UA: NEGATIVE mg/dL
Ketones, POC UA: NEGATIVE mg/dL
Leukocytes, UA: NEGATIVE
Nitrite, UA: NEGATIVE
POC PROTEIN,UA: NEGATIVE
Spec Grav, UA: 1.01 (ref 1.010–1.025)
Urobilinogen, UA: 0.2 E.U./dL
pH, UA: 8 (ref 5.0–8.0)

## 2022-07-26 MED ORDER — AZITHROMYCIN 250 MG PO TABS: ORAL_TABLET | ORAL | 0 refills | Status: AC

## 2022-07-26 NOTE — Assessment & Plan Note (Signed)
Recommend continue to work on eating healthy diet and exercise.  

## 2022-07-26 NOTE — Assessment & Plan Note (Signed)
Well controlled.  No medication changes recommended. Continue healthy diet and exercise.   Labs drawn today.

## 2022-07-26 NOTE — Assessment & Plan Note (Addendum)
Continue follow up with Orthopedics. Needs PCS to help her with housework.

## 2022-07-26 NOTE — Progress Notes (Signed)
Subjective:  Patient ID: Ashley Ritter, female    DOB: 01-Jul-1940  Age: 82 y.o. MRN: [id number]  Chief Complaint  Patient presents with   Hyperlipidemia   Hypertension    HPI Hyperlipidemia: Current medications: None Eating healthy and exercising.    Anemia: sees Dr. Melvyn Neth. Iron deficiency.    Hypertension: Complications: CKD 3A Current medications: triamterene/hct 37.5 mg/25 mg once daily.    Fistula: colovaginal fistula. Would like to be checked for UTI. Denies symptoms.   BL OA :difficulty getting in and out of shower and performing daily house cleaning duties.  States she is having difficulty keeping her house cleaned and would like to have help.     07/26/2022   10:46 AM 03/07/2022   11:13 AM 01/31/2022    2:52 PM 12/03/2021   11:19 AM 12/28/2020   11:04 AM  Depression screen PHQ 2/9  Decreased Interest 0 0 0 0 0  Down, Depressed, Hopeless 0 0 0 0 0  PHQ - 2 Score 0 0 0 0 0         12/03/2021   11:18 AM 01/31/2022    3:26 PM 03/07/2022   11:13 AM 03/14/2022    2:19 PM 07/26/2022   10:46 AM  Fall Risk  Falls in the past year? 1 1 0  0  Was there an injury with Fall? 0 0 0  0  Fall Risk Category Calculator 1 1 0  0  Fall Risk Category (Retired) Low Low Low    (RETIRED) Patient Fall Risk Level Low fall risk Low fall risk Low fall risk Low fall risk   Patient at Risk for Falls Due to No Fall Risks History of fall(s) No Fall Risks  Impaired balance/gait  Fall risk Follow up Falls evaluation completed Falls evaluation completed;Education provided Falls evaluation completed  Falls evaluation completed      Review of Systems  Constitutional:  Negative for chills, fatigue and fever.  HENT:  Positive for congestion, sinus pressure and sinus pain. Negative for ear pain, rhinorrhea and sore throat.   Respiratory:  Positive for cough. Negative for shortness of breath.   Cardiovascular:  Negative for chest pain.  Gastrointestinal:  Negative for abdominal pain,  constipation and diarrhea.  Genitourinary:  Negative for dysuria and urgency.  Musculoskeletal:  Positive for arthralgias. Negative for back pain.  Neurological:  Negative for weakness and headaches.  Psychiatric/Behavioral:  The patient is not nervous/anxious.     Current Outpatient Medications on File Prior to Visit  Medication Sig Dispense Refill   aspirin EC 81 MG tablet Take 81 mg by mouth daily. Swallow whole.     ferrous fumarate-iron polysaccharide complex (TANDEM) 162-115.2 MG CAPS capsule Take 1 capsule by mouth daily.     Garlic (GARLIQUE) 400 MG TBEC Take 400 mg by mouth.     TANDEM 53-53 MG CAPS TAKE 1 CAPSULE BY MOUTH EVERY DAY 90 capsule 2   triamterene-hydrochlorothiazide (MAXZIDE-25) 37.5-25 MG tablet Take 1 tablet by mouth daily. 90 tablet 3   VITAMIN D PO Take 1 tablet by mouth daily. Unknown strength     No current facility-administered medications on file prior to visit.   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   Hyperlipidemia    Hypertension    Hypotension 11/03/2016   Iron deficiency anemia    OA (osteoarthritis) of hip 04/23/2017   Peripheral vascular disease (HCC) 04/09/2017   Carotid arterial disease   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   Syncope 11/03/2016   Vertigo    Past Surgical History:  Procedure Laterality Date   ABDOMINAL HYSTERECTOMY     GASTRIC RESTRICTION SURGERY     SHOULDER SURGERY     TONSILLECTOMY     TOTAL HIP ARTHROPLASTY Right 04/23/2017   Procedure: RIGHT TOTAL HIP ARTHROPLASTY ANTERIOR APPROACH;  Surgeon: Ollen Gross, MD;  Location: [facility];  Service: Orthopedics;  Laterality: Right;   TOTAL HIP ARTHROPLASTY Left 03/29/2020   Procedure: TOTAL HIP  ARTHROPLASTY ANTERIOR APPROACH;  Surgeon: Ollen Gross, MD;  Location: [facility];  Service: Orthopedics;  Laterality: Left;    TUBAL LIGATION      Family History  Problem Relation Age of Onset   Diabetes Mother    Heart attack Father    Heart disease Father    Hyperlipidemia Brother    Hypertension Brother    Social History   Socioeconomic History   Marital status: Single    Spouse name: Not on file   Number of children: Not on file   Years of education: Not on file   Highest education level: Not on file  Occupational History   Not on file  Tobacco Use   Smoking status: Former    Types: Cigarettes    Quit date: 27    Years since quitting: 41.1   Smokeless tobacco: Never  Vaping Use   Vaping Use: Never used  Substance and Sexual Activity   Alcohol use: No   Drug use: No   Sexual activity: Not Currently  Other Topics Concern   Not on file  Social History Narrative   Not on file   Social Determinants of Health   Financial Resource Strain: Low Risk  (07/26/2022)   Overall Financial Resource Strain (CARDIA)    Difficulty of Paying Living Expenses: Not hard at all  Food Insecurity: No Food Insecurity (01/31/2022)   Hunger Vital Sign    Worried About Running Out of Food in the Last Year: Never true    Ran Out of Food in the Last Year: Never true  Transportation Needs: No Transportation Needs (01/31/2022)   PRAPARE - Administrator, Civil Service (Medical): No    Lack of Transportation (Non-Medical): No  Physical Activity: Inactive (01/31/2022)   Exercise Vital Sign    Days of Exercise per Week: 0 days    Minutes of Exercise per Session: 0 min  Stress: No Stress Concern Present (01/31/2022)   [employer] of Occupational Health - Occupational Stress Questionnaire    Feeling of Stress : Only a little  Social Connections: Moderately Isolated (07/26/2022)   Social Connection and Isolation Panel [NHANES]    Frequency of Communication with Friends and  Family: More than three times a week    Frequency of Social Gatherings with Friends and Family: More than three times a week    Attends Religious Services: More than 4 times per year    Active Member of [employer] or Organizations: No    Attends Banker Meetings: Never    Marital Status: Widowed    Objective:  BP 122/70   Pulse 64   Temp (!) 97.3 F (36.3 C)   Ht 5' 1.5" (  1.562 m)   Wt 199 lb (90.3 kg)   SpO2 99%   BMI 36.99 kg/m      07/26/2022   10:45 AM 03/14/2022    2:19 PM 03/07/2022   11:11 AM  BP/Weight  Systolic BP 122 137 132  Diastolic BP 70 78 64  Wt. (Lbs) 199 194.1 197  BMI 36.99 kg/m2 35.5 kg/m2 36.03 kg/m2    Physical Exam Vitals reviewed.  Constitutional:      Appearance: Normal appearance. She is obese.  HENT:     Right Ear: Tympanic membrane, ear canal and external ear normal.     Left Ear: Tympanic membrane, ear canal and external ear normal.     Nose: Nose normal.     Right Turbinates: Swollen and pale.     Left Turbinates: Swollen and pale.     Mouth/Throat:     Pharynx: Oropharynx is clear.  Neck:     Vascular: No carotid bruit.  Cardiovascular:     Rate and Rhythm: Normal rate and regular rhythm.     Pulses: Normal pulses.     Heart sounds: Normal heart sounds. No murmur heard. Pulmonary:     Effort: Pulmonary effort is normal. No respiratory distress.     Breath sounds: Normal breath sounds.  Abdominal:     General: Abdomen is flat. Bowel sounds are normal.     Palpations: Abdomen is soft.     Tenderness: There is no abdominal tenderness.  Neurological:     Mental Status: She is alert and oriented to person, place, and time.  Psychiatric:        Mood and Affect: Mood normal.        Behavior: Behavior normal.     Diabetic Foot Exam - Simple   No data filed      Lab Results  Component Value Date   WBC 6.2 07/26/2022   HGB 10.6 (L) 07/26/2022   HCT 33.3 (L) 07/26/2022   PLT 343 07/26/2022   GLUCOSE 92 07/26/2022    CHOL 236 (H) 07/26/2022   TRIG 53 07/26/2022   HDL 78 07/26/2022   LDLCALC 149 (H) 07/26/2022   ALT 11 07/26/2022   AST 16 07/26/2022   NA 142 07/26/2022   K 4.4 07/26/2022   CL 101 07/26/2022   CREATININE 0.97 07/26/2022   BUN 17 07/26/2022   CO2 26 07/26/2022   TSH 0.782 06/16/2020   INR 1.0 03/21/2020      Assessment & Plan:    Mixed hyperlipidemia Assessment & Plan: Recommend continue to work on eating healthy diet and exercise.   Orders: -     CBC with Differential/Platelet -     Lipid panel  Colovaginal fistula Assessment & Plan: No treatment at this time. Patient prefers no surgery.  Check uA   Other iron deficiency anemia Assessment & Plan: Stable.    Acute non-recurrent sinusitis of other sinus Assessment & Plan: Start on zpack.   Orders: -     Azithromycin; Take 2 tablets on day 1, then 1 tablet daily on days 2 through 5  Dispense: 6 tablet; Refill: 0  Dysuria Assessment & Plan: Check UA  Orders: -     POCT URINALYSIS DIP (CLINITEK)  Bilateral primary osteoarthritis of knee Assessment & Plan: Unable to clean her home due to her pain.  Requires assistance to shower.    Osteoarthritis of left hip, unspecified osteoarthritis type Assessment & Plan: Continue follow up with Orthopedics. Needs PCS to help her  with housework.    Hypertension, renal disease, stage 1-4 or unspecified chronic kidney disease Assessment & Plan: Well controlled.  No medication changes recommended. Continue triamterene/hydrochlorothiazide 37.5-25 mg daily.  Continue healthy diet and exercise.   Labs drawn today.  Orders: -     Comprehensive metabolic panel -     Cardiovascular Risk Assessment  Class 2 severe obesity due to excess calories with serious comorbidity and body mass index (BMI) of 36.0 to 36.9 in adult [facility]) Assessment & Plan: Recommend continue to work on eating healthy diet and exercise. Comorbidities: hyperlipidemia, hypertension.   Stage 3a  chronic kidney disease (HCC) Assessment & Plan: Stable.       Meds ordered this encounter  Medications   azithromycin (ZITHROMAX) 250 MG tablet    Sig: Take 2 tablets on day 1, then 1 tablet daily on days 2 through 5    Dispense:  6 tablet    Refill:  0    Orders Placed This Encounter  Procedures   CBC with Differential/Platelet   Comprehensive metabolic panel   Lipid panel   Cardiovascular Risk Assessment   POCT URINALYSIS DIP (CLINITEK)     Follow-up: Return in about 3 months (around 10/24/2022) for chronic fasting.   I,Katherina A Bramblett,acting as a scribe for Blane Ohara, MD.,have documented all relevant documentation on the behalf of Blane Ohara, MD,as directed by  Blane Ohara, MD while in the presence of Blane Ohara, MD.   An After Visit Summary was printed and given to the patient.  I attest that I have reviewed this visit and agree with the plan scribed by my staff.   Blane Ohara, MD [facility] [phone]  .

## 2022-07-27 LAB — COMPREHENSIVE METABOLIC PANEL
ALT: 11 IU/L (ref 0–32)
AST: 16 IU/L (ref 0–40)
Albumin/Globulin Ratio: 1.6 (ref 1.2–2.2)
Albumin: 4.2 g/dL (ref 3.7–4.7)
Alkaline Phosphatase: 65 IU/L (ref 44–121)
BUN/Creatinine Ratio: 18 (ref 12–28)
BUN: 17 mg/dL (ref 8–27)
Bilirubin Total: 0.4 mg/dL (ref 0.0–1.2)
CO2: 26 mmol/L (ref 20–29)
Calcium: 9.7 mg/dL (ref 8.7–10.3)
Chloride: 101 mmol/L (ref 96–106)
Creatinine, Ser: 0.97 mg/dL (ref 0.57–1.00)
Globulin, Total: 2.7 g/dL (ref 1.5–4.5)
Glucose: 92 mg/dL (ref 70–99)
Potassium: 4.4 mmol/L (ref 3.5–5.2)
Sodium: 142 mmol/L (ref 134–144)
Total Protein: 6.9 g/dL (ref 6.0–8.5)
eGFR: 59 mL/min/{1.73_m2} — ABNORMAL LOW (ref >59)

## 2022-07-27 LAB — LIPID PANEL
Chol/HDL Ratio: 3 ratio (ref 0.0–4.4)
Cholesterol, Total: 236 mg/dL — ABNORMAL HIGH (ref 100–199)
HDL: 78 mg/dL (ref >39)
LDL Chol Calc (NIH): 149 mg/dL — ABNORMAL HIGH (ref 0–99)
Triglycerides: 53 mg/dL (ref 0–149)
VLDL Cholesterol Cal: 9 mg/dL (ref 5–40)

## 2022-07-27 LAB — CBC WITH DIFFERENTIAL/PLATELET
Basophils Absolute: 0 10*3/uL (ref 0.0–0.2)
Basos: 1 %
EOS (ABSOLUTE): 0.1 10*3/uL (ref 0.0–0.4)
Eos: 2 %
Hematocrit: 33.3 % — ABNORMAL LOW (ref 34.0–46.6)
Hemoglobin: 10.6 g/dL — ABNORMAL LOW (ref 11.1–15.9)
Immature Grans (Abs): 0 10*3/uL (ref 0.0–0.1)
Immature Granulocytes: 0 %
Lymphocytes Absolute: 2 10*3/uL (ref 0.7–3.1)
Lymphs: 32 %
MCH: 28 pg (ref 26.6–33.0)
MCHC: 31.8 g/dL (ref 31.5–35.7)
MCV: 88 fL (ref 79–97)
Monocytes Absolute: 0.5 10*3/uL (ref 0.1–0.9)
Monocytes: 8 %
Neutrophils Absolute: 3.6 10*3/uL (ref 1.4–7.0)
Neutrophils: 57 %
Platelets: 343 10*3/uL (ref 150–450)
RBC: 3.79 x10E6/uL (ref 3.77–5.28)
RDW: 12.2 % (ref 11.7–15.4)
WBC: 6.2 10*3/uL (ref 3.4–10.8)

## 2022-07-27 LAB — CARDIOVASCULAR RISK ASSESSMENT

## 2022-07-28 DIAGNOSIS — M170 Bilateral primary osteoarthritis of knee: Secondary | ICD-10-CM | POA: Insufficient documentation

## 2022-07-28 DIAGNOSIS — J019 Acute sinusitis, unspecified: Secondary | ICD-10-CM | POA: Insufficient documentation

## 2022-07-28 DIAGNOSIS — R300 Dysuria: Secondary | ICD-10-CM | POA: Insufficient documentation

## 2022-07-28 NOTE — Assessment & Plan Note (Signed)
Check UA

## 2022-07-28 NOTE — Assessment & Plan Note (Signed)
Start on zpack.

## 2022-07-28 NOTE — Progress Notes (Signed)
Blood count abnormal.  Consistent with anemia.  Stable.  Sees hematology. Liver function normal.  Kidney function normal.  Cholesterol: LDL is very high at 149.  Goal is less than 100.  Strongly recommend Crestor 10 mg nightly. Urinalysis was normal.

## 2022-07-28 NOTE — Assessment & Plan Note (Signed)
Recommend continue to work on eating healthy diet and exercise. Comorbidities: hyperlipidemia, hypertension.

## 2022-07-28 NOTE — Assessment & Plan Note (Signed)
Stable. 

## 2022-07-28 NOTE — Assessment & Plan Note (Signed)
No treatment at this time. Patient prefers no surgery.  Check uA

## 2022-07-28 NOTE — Assessment & Plan Note (Addendum)
Unable to clean her home due to her pain.  Requires assistance to shower.

## 2022-08-01 ENCOUNTER — Other Ambulatory Visit: Payer: Self-pay

## 2022-08-01 MED ORDER — EZETIMIBE 10 MG PO TABS: 10.0000 mg | ORAL_TABLET | Freq: Every day | ORAL | 1 refills | Status: DC

## 2022-09-09 ENCOUNTER — Ambulatory Visit: Payer: PPO | Admitting: Family Medicine

## 2022-09-10 ENCOUNTER — Ambulatory Visit: Payer: Medicare Other | Attending: Cardiology | Admitting: Cardiology

## 2022-09-10 ENCOUNTER — Encounter: Payer: Self-pay | Admitting: Cardiology

## 2022-09-10 VITALS — BP 168/90 | HR 70 | Ht 62.0 in | Wt 199.2 lb

## 2022-09-10 DIAGNOSIS — E782 Mixed hyperlipidemia: Secondary | ICD-10-CM | POA: Insufficient documentation

## 2022-09-10 DIAGNOSIS — I6523 Occlusion and stenosis of bilateral carotid arteries: Secondary | ICD-10-CM | POA: Diagnosis not present

## 2022-09-10 DIAGNOSIS — I10 Essential (primary) hypertension: Secondary | ICD-10-CM | POA: Diagnosis not present

## 2022-09-10 NOTE — Patient Instructions (Addendum)
Medication Instructions:  Your physician recommends that you continue on your current medications as directed. Please refer to the Current Medication list given to you today.  *If you need a refill on your cardiac medications before your next appointment, please call your pharmacy*   Lab Work: None Ordered If you have labs (blood work) drawn today and your tests are completely normal, you will receive your results only by: MyChart Message (if you have MyChart) OR A paper copy in the mail If you have any lab test that is abnormal or we need to change your treatment, we will call you to review the results.   Testing/Procedures: Your physician has requested that you have a carotid duplex. This test is an ultrasound of the carotid arteries in your neck. It looks at blood flow through these arteries that supply the brain with blood. Allow one hour for this exam. There are no restrictions or special instructions.    Follow-Up: At [facility], you and your health needs are our priority.  As part of our continuing mission to provide you with exceptional heart care, we have created designated Provider Care Teams.  These Care Teams include your primary Cardiologist (physician) and Advanced Practice Providers (APPs -  Physician Assistants and Nurse Practitioners) who all work together to provide you with the care you need, when you need it.  We recommend signing up for the patient portal called "MyChart".  Sign up information is provided on this After Visit Summary.  MyChart is used to connect with patients for Virtual Visits (Telemedicine).  Patients are able to view lab/test results, encounter notes, upcoming appointments, etc.  Non-urgent messages can be sent to your provider as well.   To learn more about what you can do with MyChart, go to [email or web address].    Your next appointment:   12 month(s)  The format for your next appointment:   In Person  Provider:   Robert Krasowski, MD     Other Instructions NA  

## 2022-09-10 NOTE — Addendum Note (Signed)
Addended by: Jacobo Forest D on: 09/10/2022 05:01 PM   Modules accepted: Orders

## 2022-09-10 NOTE — Progress Notes (Signed)
Cardiology Office Note:    Date:  09/10/2022   ID:  Ashley Ritter, DOB Dec 06, 1940, MRN [id number]  PCP:  Rochel Brome, MD  Cardiologist:  Jenne Campus, MD    Referring MD: Rochel Brome, MD   Chief Complaint  Patient presents with   Follow-up  Doing fine  History of Present Illness:    Ashley Ritter is a 82 y.o. female past medical history significant for peripheral vascular disease in form of carotid arterial stenosis not critical, essential hypertension, dyslipidemia refusing statin.  Comes today to months for follow-up we will doing fine.  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/12/2020   Hyperlipidemia    Hypertension    Hypotension 11/03/2016   Iron deficiency anemia    OA (osteoarthritis) of hip 04/23/2017   Peripheral vascular disease 04/09/2017   Carotid arterial disease   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   Syncope 11/03/2016   Vertigo     Past Surgical History:  Procedure Laterality Date   ABDOMINAL HYSTERECTOMY     GASTRIC RESTRICTION SURGERY     SHOULDER SURGERY     TONSILLECTOMY     TOTAL HIP ARTHROPLASTY Right 04/23/2017   Procedure: RIGHT TOTAL HIP ARTHROPLASTY ANTERIOR APPROACH;  Surgeon: Gaynelle Arabian, MD;  Location: [facility];  Service: Orthopedics;  Laterality: Right;   TOTAL HIP ARTHROPLASTY Left 03/29/2020   Procedure: TOTAL HIP ARTHROPLASTY ANTERIOR APPROACH;  Surgeon: Gaynelle Arabian, MD;  Location: [facility];  Service: Orthopedics;  Laterality: Left;  140min   TUBAL LIGATION      Current Medications: Current Meds  Medication Sig   aspirin EC 81 MG tablet Take 81 mg by mouth  daily. Swallow whole.   ferrous fumarate-iron polysaccharide complex (TANDEM) 162-115.2 MG CAPS capsule Take 1 capsule by mouth daily.   Garlic (GARLIQUE) [id number] MG TBEC Take 400 mg by mouth daily.   Multiple Vitamin (MULTIVITAMIN ADULT PO) Take 1 tablet by mouth daily.   TANDEM 53-53 MG CAPS TAKE 1 CAPSULE BY MOUTH EVERY DAY   triamterene-hydrochlorothiazide (MAXZIDE-25) 37.5-25 MG tablet Take 1 tablet by mouth daily.   VITAMIN D PO Take 1 tablet by mouth daily. Unknown strength   [DISCONTINUED] ezetimibe (ZETIA) 10 MG tablet Take 1 tablet (10 mg total) by mouth daily.     Allergies:   Penicillins and Oxycodone   Social History   Socioeconomic History   Marital status: Single    Spouse name: Not on file   Number of children: Not on file   Years of education: Not on file   Highest education level: Not on file  Occupational History   Not on file  Tobacco Use   Smoking status: Former    Types: Cigarettes    Quit date: 64    Years since quitting: 41.2   Smokeless tobacco: Never  Vaping Use   Vaping Use: Never used  Substance and Sexual Activity   Alcohol use: No   Drug use: No   Sexual activity: Not Currently  Other Topics Concern   Not on file  Social History Narrative   Not on file   Social  Determinants of Health   Financial Resource Strain: Low Risk  (07/26/2022)   Overall Financial Resource Strain (CARDIA)    Difficulty of Paying Living Expenses: Not hard at all  Food Insecurity: No Food Insecurity (01/31/2022)   Hunger Vital Sign    Worried About Running Out of Food in the Last Year: Never true    Ran Out of Food in the Last Year: Never true  Transportation Needs: No Transportation Needs (01/31/2022)   PRAPARE - Hydrologist (Medical): No    Lack of Transportation (Non-Medical): No  Physical Activity: Inactive (01/31/2022)   Exercise Vital Sign    Days of Exercise per Week: 0 days    Minutes of Exercise per Session: 0 min  Stress: No  Stress Concern Present (01/31/2022)   [place]    Feeling of Stress : Only a little  Social Connections: Moderately Isolated (07/26/2022)   Social Connection and Isolation Panel [NHANES]    Frequency of Communication with Friends and Family: More than three times a week    Frequency of Social Gatherings with Friends and Family: More than three times a week    Attends Religious Services: More than 4 times per year    Active Member of [employer] or Organizations: No    Attends Archivist Meetings: Never    Marital Status: Widowed     Family History: The patient's family history includes Diabetes in her mother; Heart attack in her father; Heart disease in her father; Hyperlipidemia in her brother; Hypertension in her brother. ROS:   Please see the history of present illness.    All 14 point review of systems negative except as described per history of present illness  EKGs/Labs/Other Studies Reviewed:      Recent Labs: 07/26/2022: ALT 11; BUN 17; Creatinine, Ser 0.97; Hemoglobin 10.6; Platelets 343; Potassium 4.4; Sodium 142  Recent Lipid Panel    Component Value Date/Time   CHOL 236 (H) 07/26/2022 1129   TRIG 53 07/26/2022 1129   HDL 78 07/26/2022 1129   CHOLHDL 3.0 07/26/2022 1129   LDLCALC 149 (H) 07/26/2022 1129    Physical Exam:    VS:  BP (!) 168/90 (BP Location: Left Arm, Patient Position: Sitting)   Pulse 70   Ht 5\' 2"  (1.575 m)   Wt 199 lb 3.2 oz (90.4 kg)   SpO2 97%   BMI 36.43 kg/m     Wt Readings from Last 3 Encounters:  09/10/22 199 lb 3.2 oz (90.4 kg)  07/26/22 199 lb (90.3 kg)  03/14/22 194 lb 1.6 oz (88 kg)     GEN:  Well nourished, well developed in no acute distress HEENT: Normal NECK: No JVD; No carotid bruits LYMPHATICS: No lymphadenopathy CARDIAC: RRR, no murmurs, no rubs, no gallops RESPIRATORY:  Clear to auscultation without rales, wheezing or rhonchi  ABDOMEN: Soft,  non-tender, non-distended MUSCULOSKELETAL:  No edema; No deformity  SKIN: Warm and dry LOWER EXTREMITIES: no swelling NEUROLOGIC:  Alert and oriented x 3 PSYCHIATRIC:  Normal affect   ASSESSMENT:    1. Bilateral carotid artery stenosis   2. Mixed hyperlipidemia   3. Essential hypertension    PLAN:    In order of problems listed above:  Peripheral vascular disease time to recheck her cardiac ultrasounds which I will do. Essential hypertension uncontrolled.  She cannot tell me what her blood pressure at home is she also tells me that she got very small  blood pressure monitor that she put at rest which is usually not the most accurate.  She promised to get new blood pressure monitor check blood pressure on the regular basis and let me know if it is uncontrolled today she also got appoint with her primary care physician within next few weeks there is will be important to recheck blood pressure and intensify her medications. Dyslipidemia I did review K PN which show me LDL 149 HDL 78 again I brought an issue of starting cholesterol medication she does not want to do that   Medication Adjustments/Labs and Tests Ordered: Current medicines are reviewed at length with the patient today.  Concerns regarding medicines are outlined above.  No orders of the defined types were placed in this encounter.  Medication changes: No orders of the defined types were placed in this encounter.   Signed, Park Liter, MD, [facility] 09/10/2022 4:48 PM    [place]

## 2022-09-17 ENCOUNTER — Inpatient Hospital Stay: Payer: Medicare Other

## 2022-09-17 ENCOUNTER — Other Ambulatory Visit: Payer: Self-pay | Admitting: Oncology

## 2022-09-17 ENCOUNTER — Inpatient Hospital Stay: Payer: Medicare Other | Attending: Oncology | Admitting: Oncology

## 2022-09-17 VITALS — BP 163/73 | HR 57 | Temp 99.3°F | Resp 14 | Ht 61.5 in | Wt 197.7 lb

## 2022-09-17 DIAGNOSIS — D508 Other iron deficiency anemias: Secondary | ICD-10-CM

## 2022-09-17 DIAGNOSIS — D509 Iron deficiency anemia, unspecified: Secondary | ICD-10-CM | POA: Insufficient documentation

## 2022-09-17 DIAGNOSIS — D649 Anemia, unspecified: Secondary | ICD-10-CM | POA: Diagnosis not present

## 2022-09-17 LAB — CMP (CANCER CENTER ONLY)
ALT: 11 U/L (ref 0–44)
AST: 19 U/L (ref 15–41)
Albumin: 4.3 g/dL (ref 3.5–5.0)
Alkaline Phosphatase: 53 U/L (ref 38–126)
Anion gap: 9 (ref 5–15)
BUN: 15 mg/dL (ref 8–23)
CO2: 28 mmol/L (ref 22–32)
Calcium: 9.3 mg/dL (ref 8.9–10.3)
Chloride: 102 mmol/L (ref 98–111)
Creatinine: 1.15 mg/dL — ABNORMAL HIGH (ref 0.44–1.00)
GFR, Estimated: 48 mL/min — ABNORMAL LOW (ref >60)
Glucose, Bld: 95 mg/dL (ref 70–99)
Potassium: 3.7 mmol/L (ref 3.5–5.1)
Sodium: 139 mmol/L (ref 135–145)
Total Bilirubin: 0.7 mg/dL (ref 0.3–1.2)
Total Protein: 7.5 g/dL (ref 6.5–8.1)

## 2022-09-17 LAB — FERRITIN: Ferritin: 44 ng/mL (ref 11–307)

## 2022-09-17 LAB — CBC AND DIFFERENTIAL
HCT: 33 — AB (ref 36–46)
Hemoglobin: 10.8 — AB (ref 12.0–16.0)
Neutrophils Absolute: 2.6
Platelets: 305 10*3/uL (ref 150–400)
WBC: 5.3

## 2022-09-17 LAB — IRON AND TIBC
Iron: 86 ug/dL (ref 28–170)
Saturation Ratios: 23 % (ref 10.4–31.8)
TIBC: 382 ug/dL (ref 250–450)
UIBC: 296 ug/dL

## 2022-09-17 LAB — CBC: RBC: 3.86 — AB (ref 3.87–5.11)

## 2022-09-17 NOTE — Progress Notes (Signed)
[facility]  [address] [place],  [place]  [address] [phone]  Clinic Day:  09/17/2022  Referring physician: Blane Ohara, MD  HISTORY OF PRESENT ILLNESS:  The patient is a 82 y.o. 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  In the past, IV Feraheme was effective in replenishing her iron stores and improving her hemoglobin.  She comes in today to reassess her anemia.  Since her last visit, the patient has been doing fine.  She denies having increased fatigue or any overt forms of blood loss which concern her for recurrent iron deficiency anemia.  She continues to take 1 iron pill daily.  PHYSICAL EXAM:  Blood pressure (!) 163/73, pulse (!) 57, temperature 99.3 F (37.4 C), resp. rate 14, height 5' 1.5" (1.562 m), weight 197 lb 11.2 oz (89.7 kg), SpO2 93 %. Wt Readings from Last 3 Encounters:  09/17/22 197 lb 11.2 oz (89.7 kg)  09/10/22 199 lb 3.2 oz (90.4 kg)  07/26/22 199 lb (90.3 kg)   Body mass index is 36.75 kg/m. Performance status (ECOG): 1 - Symptomatic but completely ambulatory Physical Exam Constitutional:      Appearance: Normal appearance. She is not ill-appearing.  HENT:     Mouth/Throat:     Mouth: Mucous membranes are moist.     Pharynx: Oropharynx is clear. No oropharyngeal exudate or posterior oropharyngeal erythema.  Cardiovascular:     Rate and Rhythm: Normal rate and regular rhythm.     Heart sounds: No murmur heard.    No friction rub. No gallop.  Pulmonary:     Effort: Pulmonary effort is normal. No respiratory distress.     Breath sounds: Normal breath sounds. No wheezing, rhonchi or rales.  Abdominal:     General: Bowel sounds are normal. There is no distension.     Palpations: Abdomen is soft. There is no mass.     Tenderness: There is no abdominal tenderness.  Musculoskeletal:        General: No swelling.     Right lower leg: No edema.      Left lower leg: No edema.  Lymphadenopathy:     Cervical: No cervical adenopathy.     Upper Body:     Right upper body: No supraclavicular or axillary adenopathy.     Left upper body: No supraclavicular or axillary adenopathy.     Lower Body: No right inguinal adenopathy. No left inguinal adenopathy.  Skin:    General: Skin is warm.     Coloration: Skin is not jaundiced.     Findings: No lesion or rash.  Neurological:     General: No focal deficit present.     Mental Status: She is alert and oriented to person, place, and time. Mental status is at baseline.  Psychiatric:        Mood and Affect: Mood normal.        Behavior: Behavior normal.        Thought Content: Thought content normal.    LABS:     Latest Reference Range & Units 09/17/22 14:04  Sodium 135 - 145 mmol/L 139  Potassium 3.5 - 5.1 mmol/L 3.7  Chloride 98 - 111 mmol/L 102  CO2 22 - 32 mmol/L 28  Glucose 70 - 99 mg/dL 95  BUN 8 - 23 mg/dL 15  Creatinine 9.56 - 2.13 mg/dL 0.86 (H)  Calcium 8.9 - 10.3 mg/dL 9.3  Anion gap 5 - 15  9  Alkaline Phosphatase 38 - 126 U/L 53  Albumin 3.5 - 5.0 g/dL 4.3  AST 15 - 41 U/L 19  ALT 0 - 44 U/L 11  Total Protein 6.5 - 8.1 g/dL 7.5  Total Bilirubin 0.3 - 1.2 mg/dL 0.7  GFR, Est Non African American >60 mL/min 48 (L)  Iron 28 - 170 ug/dL 86  UIBC ug/dL 989  TIBC 211 - 941 ug/dL 740  Saturation Ratios 10.4 - 31.8 % 23  Ferritin 11 - 307 ng/mL 44  (H): Data is abnormally high (L): Data is abnormally low  ASSESSMENT & PLAN:  Assessment/Plan:  An 82 y.o. female with iron deficiency anemia.  Her hemoglobin of 10.8 today is the same as it was 6 months ago.  This remains the highest hemoglobin she has had over these past few years.  Clinically, she appears to be doing well.  I have no problems with her continuing to take an iron pill on a daily basis to help maintain the fortification of iron stores.  As she is stable from a hematologic standpoint, I will see her back in 6  months for repeat clinical assessment.  The patient understands all the plans discussed today and is in agreement with them.    Ashley Ritter Kirby Funk, MD

## 2022-09-23 ENCOUNTER — Ambulatory Visit: Payer: Medicare Other | Attending: Cardiology

## 2022-09-23 DIAGNOSIS — I6523 Occlusion and stenosis of bilateral carotid arteries: Secondary | ICD-10-CM

## 2022-09-30 DIAGNOSIS — H40013 Open angle with borderline findings, low risk, bilateral: Secondary | ICD-10-CM | POA: Diagnosis not present

## 2022-10-02 ENCOUNTER — Telehealth: Payer: Self-pay

## 2022-10-02 NOTE — Telephone Encounter (Signed)
LVM per DPR- per Dr. Vanetta Shawl note regarding Carotid US results. Encouraged to call with any questions. Routed to PCP.

## 2022-10-02 NOTE — Telephone Encounter (Signed)
Results reviewed with pt as per Dr. Krasowski's note.  Pt verbalized understanding and had no additional questions. Routed to PCP  

## 2022-10-29 DIAGNOSIS — K921 Melena: Secondary | ICD-10-CM | POA: Diagnosis not present

## 2022-10-29 DIAGNOSIS — K625 Hemorrhage of anus and rectum: Secondary | ICD-10-CM | POA: Diagnosis not present

## 2022-11-05 ENCOUNTER — Telehealth: Payer: Self-pay

## 2022-11-05 DIAGNOSIS — N823 Fistula of vagina to large intestine: Secondary | ICD-10-CM | POA: Diagnosis not present

## 2022-11-05 DIAGNOSIS — N3090 Cystitis, unspecified without hematuria: Secondary | ICD-10-CM | POA: Diagnosis not present

## 2022-11-05 LAB — LAB REPORT - SCANNED: EGFR: 43

## 2022-11-05 NOTE — Telephone Encounter (Signed)
Patient called and stated that she is needing a hospital follow up appointment. She states that she was seen in the ER at [place] on 10/[place]/22 and they Contacted her GI specialist at that time before discharge and was informing them that the patient needed to be seen sooner than the scheduled appointment she was given with Dr. Nedra Hai which was in July. They still have not contacted the patient to get her in sooner and patient is still having blood in her stool she states. Please advise scheduling.

## 2022-11-05 NOTE — Telephone Encounter (Signed)
Appointment has been scheduled for this Friday

## 2022-11-08 ENCOUNTER — Inpatient Hospital Stay: Payer: Medicare Other | Admitting: Family Medicine

## 2022-11-18 NOTE — Progress Notes (Signed)
[facility]  [address] [place],  [place]  [address] [phone]  Clinic Day:  11/19/2022  Referring physician: Blane Ohara, MD  HISTORY OF PRESENT ILLNESS:  The patient is a 82 y.o. 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  In the past, IV Feraheme was effective in replenishing her iron stores and improving her hemoglobin.  She comes in today off-schedule due to her having overt forms of lower GI tract blood loss.  Included in this were 2 voluminous amounts of blood loss which concerned her enough to go to a local emergency room for further evaluation. Labs at that time showed a low hemoglobin of 7.8.  Based upon this, an ER doctor told her to double her oral iron, which she has been doing over the past few weeks.  She has had more fatigue, which she attributes to her GI blood loss.  She is scheduled to see her GI physician in early July 2024.  PHYSICAL EXAM:  Blood pressure (!) 183/77, pulse (!) 58, temperature 98.7 F (37.1 C), resp. rate 16, height 5' 1.5" (1.562 m), weight 197 lb 1.6 oz (89.4 kg), SpO2 95 %. Wt Readings from Last 3 Encounters:  11/19/22 197 lb 1.6 oz (89.4 kg)  09/17/22 197 lb 11.2 oz (89.7 kg)  09/10/22 199 lb 3.2 oz (90.4 kg)   Body mass index is 36.64 kg/m. Performance status (ECOG): 1 - Symptomatic but completely ambulatory Physical Exam Constitutional:      Appearance: Normal appearance. She is not ill-appearing.  HENT:     Mouth/Throat:     Mouth: Mucous membranes are moist.     Pharynx: Oropharynx is clear. No oropharyngeal exudate or posterior oropharyngeal erythema.  Cardiovascular:     Rate and Rhythm: Normal rate and regular rhythm.     Heart sounds: No murmur heard.    No friction rub. No gallop.  Pulmonary:     Effort: Pulmonary effort is normal. No respiratory distress.     Breath sounds: Normal breath sounds. No wheezing, rhonchi  or rales.  Abdominal:     General: Bowel sounds are normal. There is no distension.     Palpations: Abdomen is soft. There is no mass.     Tenderness: There is no abdominal tenderness.  Musculoskeletal:        General: No swelling.     Right lower leg: No edema.     Left lower leg: No edema.  Lymphadenopathy:     Cervical: No cervical adenopathy.     Upper Body:     Right upper body: No supraclavicular or axillary adenopathy.     Left upper body: No supraclavicular or axillary adenopathy.     Lower Body: No right inguinal adenopathy. No left inguinal adenopathy.  Skin:    General: Skin is warm.     Coloration: Skin is not jaundiced.     Findings: No lesion or rash.  Neurological:     General: No focal deficit present.     Mental Status: She is alert and oriented to person, place, and time. Mental status is at baseline.  Psychiatric:        Mood and Affect: Mood normal.        Behavior: Behavior normal.        Thought Content: Thought content normal.    LABS:     Latest Reference Range & Units 09/17/22 14:04  Iron 28 - 170 ug/dL 86  UIBC ug/dL 161  TIBC 096 - 045 ug/dL 409  Saturation Ratios 10.4 - 31.8 % 23  Ferritin 11 - 307 ng/mL 44    ASSESSMENT & PLAN:  Assessment/Plan:  An 82 y.o. female with iron deficiency anemia.  Her hemoglobin of 8.4 is the lowest it has been in quite some time.  As mentioned previously, she has had overt, copious amounts of blood loss over these past few weeks.  Even though her iron studies are not particularly low, I will arrange for her to receive IV iron to improve her iron stores and hemoglobin.  She knows to keep her appointment with her GI doctor in the forthcoming weeks.  Ultimately, she may need a repeat colonoscopy to identify the source of her lower GI tract blood loss.  Otherwise, I will see her back in 3 months for repeat clinical assessment. The patient understands all the plans discussed today and is in agreement with them.     Jullian Clayson Kirby Funk, MD

## 2022-11-19 ENCOUNTER — Other Ambulatory Visit: Payer: Self-pay | Admitting: Oncology

## 2022-11-19 ENCOUNTER — Inpatient Hospital Stay: Payer: Medicare Other

## 2022-11-19 ENCOUNTER — Inpatient Hospital Stay: Payer: Medicare Other | Attending: Oncology | Admitting: Oncology

## 2022-11-19 VITALS — BP 183/77 | HR 58 | Temp 98.7°F | Resp 16 | Ht 61.5 in | Wt 197.1 lb

## 2022-11-19 DIAGNOSIS — D500 Iron deficiency anemia secondary to blood loss (chronic): Secondary | ICD-10-CM | POA: Diagnosis not present

## 2022-11-19 DIAGNOSIS — D509 Iron deficiency anemia, unspecified: Secondary | ICD-10-CM | POA: Insufficient documentation

## 2022-11-19 DIAGNOSIS — D508 Other iron deficiency anemias: Secondary | ICD-10-CM

## 2022-11-19 DIAGNOSIS — D649 Anemia, unspecified: Secondary | ICD-10-CM | POA: Diagnosis not present

## 2022-11-19 LAB — CBC AND DIFFERENTIAL
HCT: 26 — AB (ref 36–46)
Hemoglobin: 8.4 — AB (ref 12.0–16.0)
Neutrophils Absolute: 4.26
Platelets: 383 10*3/uL (ref 150–400)
WBC: 7.1

## 2022-11-19 LAB — CBC: RBC: 2.96 — AB (ref 3.87–5.11)

## 2022-11-21 ENCOUNTER — Inpatient Hospital Stay: Payer: Medicare Other

## 2022-11-21 VITALS — BP 127/53 | HR 74 | Temp 98.0°F | Resp 20 | Wt 199.0 lb

## 2022-11-21 DIAGNOSIS — D508 Other iron deficiency anemias: Secondary | ICD-10-CM

## 2022-11-21 DIAGNOSIS — D509 Iron deficiency anemia, unspecified: Secondary | ICD-10-CM | POA: Diagnosis not present

## 2022-11-21 MED ORDER — SODIUM CHLORIDE 0.9 % IV SOLN: Freq: Once | INTRAVENOUS | Status: AC

## 2022-11-21 MED ORDER — SODIUM CHLORIDE 0.9 % IV SOLN
510.0000 mg | Freq: Once | INTRAVENOUS | Status: AC
  Administered 2022-11-21: 510 mg via INTRAVENOUS
  Filled 2022-11-21: qty 510

## 2022-11-21 NOTE — Patient Instructions (Signed)

## 2022-11-23 ENCOUNTER — Encounter: Payer: Self-pay | Admitting: Oncology

## 2022-11-26 ENCOUNTER — Ambulatory Visit (INDEPENDENT_AMBULATORY_CARE_PROVIDER_SITE_OTHER): Payer: Medicare Other | Admitting: Family Medicine

## 2022-11-26 ENCOUNTER — Encounter: Payer: Self-pay | Admitting: Family Medicine

## 2022-11-26 VITALS — BP 124/64 | HR 62 | Temp 98.1°F | Ht 61.5 in | Wt 193.0 lb

## 2022-11-26 DIAGNOSIS — N3000 Acute cystitis without hematuria: Secondary | ICD-10-CM

## 2022-11-26 DIAGNOSIS — M170 Bilateral primary osteoarthritis of knee: Secondary | ICD-10-CM | POA: Diagnosis not present

## 2022-11-26 DIAGNOSIS — N1832 Chronic kidney disease, stage 3b: Secondary | ICD-10-CM

## 2022-11-26 DIAGNOSIS — D508 Other iron deficiency anemias: Secondary | ICD-10-CM

## 2022-11-26 DIAGNOSIS — E782 Mixed hyperlipidemia: Secondary | ICD-10-CM

## 2022-11-26 DIAGNOSIS — I129 Hypertensive chronic kidney disease with stage 1 through stage 4 chronic kidney disease, or unspecified chronic kidney disease: Secondary | ICD-10-CM | POA: Diagnosis not present

## 2022-11-26 DIAGNOSIS — N1831 Chronic kidney disease, stage 3a: Secondary | ICD-10-CM

## 2022-11-26 DIAGNOSIS — I10 Essential (primary) hypertension: Secondary | ICD-10-CM | POA: Diagnosis not present

## 2022-11-26 DIAGNOSIS — N824 Other female intestinal-genital tract fistulae: Secondary | ICD-10-CM

## 2022-11-26 LAB — POCT URINALYSIS DIP (CLINITEK)
Bilirubin, UA: NEGATIVE
Blood, UA: NEGATIVE
Glucose, UA: NEGATIVE mg/dL
Ketones, POC UA: NEGATIVE mg/dL
Leukocytes, UA: NEGATIVE
Nitrite, UA: NEGATIVE
POC PROTEIN,UA: NEGATIVE
Spec Grav, UA: 1.01 (ref 1.010–1.025)
Urobilinogen, UA: 0.2 E.U./dL
pH, UA: 6 (ref 5.0–8.0)

## 2022-11-26 NOTE — Progress Notes (Addendum)
Subjective:  Patient ID: Ashley Ritter, female    DOB: Oct 30, 1940  Age: 82 y.o. MRN: [id number]  Chief Complaint  Patient presents with   Medical Management of Chronic Issues    HPI Hyperlipidemia: Current medications: None Eating healthy and exercising.    Anemia: sees Dr. Melvyn Neth. Iron deficiency.    Hypertension: Complications: CKD 3B Current medications: triamterene/hct 37.5 mg/25 mg once daily.    Fistula: colovaginal fistula. Would like to be checked for UTI. Denies symptoms.    BL Knee OA: Difficulty getting in and out of shower and performing daily house cleaning duties.   Seen at ER 5/28 treated for cystitis and BV with cipro, flagyl and zofran  Summaries below. Tue Nov 05, 2022  2235 ER provider interpretation of Labs: Urinalysis with moderate evidence of infection, hematologic panel with moderate anemia, hemoglobin 7.8, mildly decreased compared with prior hemoglobin from 1 week ago, around 8.2, metabolic panel with mild renal insufficiency, not grossly changed compared with previous metabolic panels, wet prep with WBCs and clue cells positive [DC] " >>>>>>>>>>>>>>>>>>>>>>>>> Wed Nov 06, 2022  1346 Overall patient had a GI illness last week, but admittedly diverticulosis versus diverticulitis versus gastroenteritis, has had some blood per rectum, her daughter has a few pictures of bloody stool, last episode 2 days ago, now having some urinary symptoms and vaginal pressure, presumably from known fistula, given diarrheal illness and concern of fecal contamination do believe she has genitourinary infection now, will cover for E. coli as well as bacterial vaginosis with cinoxacin and metronidazole, discussed with patient daughter that we did ask given intermittent bloody stool, her hemoglobin has mildly changed, overall her vital signs are quite stable while being monitored here in the emergency department, offered her observation admission to trend her hemoglobin and  vital signs as well as monitor her and have GI see her in the morning however she has GI follow-up scheduled and would really like to go home if possible, lives with daughter, will follow-up for repeat CBC in a few days, strict return precautions and will return in the interim for any worsening signs, early signs of blood loss or anemia [DC]    10/29/22 Patient presents with rectal bleeding. Reassuringly physical exam is essentially normal. Has no abdominal tenderness so low suspicion for an acute abdomen. Rectal exam was without any profuse blood or melena on exam which is reassuring. She is not on blood thinners. Lab work here fairly unremarkable compared to her baseline. Her hemoglobin is 8.1 which just seems right around her baseline. CMP is essentially similar to her baseline. Vitals remain normal here. I did reach out to GI and briefly spoke with them, they were agreeable that as the patient is not actively having bleeding right now is otherwise doing well she can likely be discharged home to follow-up in their office in the outpatient setting. Are currently working on moving her appointment from July to a sooner date. At this point she appears extremely well, I discussed strict return precautions for which she verbalized understanding to. At this point will be discharged home. She is aware if she has worsening of her bleeding she needs to return to the ED. Can follow-up with GI. Patient agreeable with this plan.     11/26/2022   11:43 AM 07/26/2022   10:46 AM 03/07/2022   11:13 AM 01/31/2022    2:52 PM 12/03/2021   11:19 AM  Depression screen PHQ 2/9  Decreased Interest 0 0 0 0 0  Down, Depressed, Hopeless 0 0 0 0 0  PHQ - 2 Score 0 0 0 0 0        11/26/2022   11:43 AM  Fall Risk   Falls in the past year? 0  Number falls in past yr: 0  Injury with Fall? 0  Risk for fall due to : Impaired balance/gait  Follow up Falls evaluation completed    Patient Care Team: Blane Ohara, MD as PCP -  General (Internal Medicine) Georgeanna Lea, MD as PCP - Cardiology (Cardiology) Weston Settle, MD as Consulting Physician (Hematology and Oncology)   Review of Systems  Constitutional:  Negative for chills, fatigue and fever.  HENT:  Negative for congestion, ear pain, rhinorrhea and sore throat.   Respiratory:  Negative for cough and shortness of breath.   Cardiovascular:  Negative for chest pain.  Gastrointestinal:  Negative for abdominal pain, constipation, diarrhea, nausea and vomiting.  Genitourinary:  Negative for dysuria and urgency.  Musculoskeletal:  Negative for back pain and myalgias.  Neurological:  Negative for dizziness, weakness, light-headedness and headaches.  Psychiatric/Behavioral:  Negative for dysphoric mood. The patient is not nervous/anxious.     Current Outpatient Medications on File Prior to Visit  Medication Sig Dispense Refill   aspirin EC 81 MG tablet Take 81 mg by mouth daily. Swallow whole.     ferrous fumarate-iron polysaccharide complex (TANDEM) 162-115.2 MG CAPS capsule Take 1 capsule by mouth daily.     Garlic (GARLIQUE) 400 MG TBEC Take 400 mg by mouth daily.     Multiple Vitamin (MULTIVITAMIN ADULT PO) Take 1 tablet by mouth daily.     TANDEM 53-53 MG CAPS TAKE 1 CAPSULE BY MOUTH EVERY DAY 90 capsule 2   triamterene-hydrochlorothiazide (MAXZIDE-25) 37.5-25 MG tablet Take 1 tablet by mouth daily. 90 tablet 3   VITAMIN D PO Take 1 tablet by mouth daily. Unknown strength     No current facility-administered medications on file prior to visit.   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   Hyperlipidemia    Hypertension    Hypotension 11/03/2016   Iron deficiency anemia    OA  (osteoarthritis) of hip 04/23/2017   Peripheral vascular disease (HCC) 04/09/2017   Carotid arterial disease   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   Syncope 11/03/2016   Vertigo    Past Surgical History:  Procedure Laterality Date   ABDOMINAL HYSTERECTOMY     GASTRIC RESTRICTION SURGERY     SHOULDER SURGERY     TONSILLECTOMY     TOTAL HIP ARTHROPLASTY Right 04/23/2017   Procedure: RIGHT TOTAL HIP ARTHROPLASTY ANTERIOR APPROACH;  Surgeon: Ollen Gross, MD;  Location: [facility];  Service: Orthopedics;  Laterality: Right;   TOTAL HIP ARTHROPLASTY Left 03/29/2020   Procedure: TOTAL HIP ARTHROPLASTY ANTERIOR APPROACH;  Surgeon: Ollen Gross, MD;  Location: [facility];  Service: Orthopedics;  Laterality: Left;    TUBAL LIGATION      Family History  Problem Relation Age of Onset   Diabetes Mother    Heart attack Father    Heart disease Father    Hyperlipidemia Brother    Hypertension Brother    Social History  Socioeconomic History   Marital status: Single    Spouse name: Not on file   Number of children: Not on file   Years of education: Not on file   Highest education level: Not on file  Occupational History   Not on file  Tobacco Use   Smoking status: Former    Types: Cigarettes    Quit date: 7    Years since quitting: 41.5   Smokeless tobacco: Never  Vaping Use   Vaping Use: Never used  Substance and Sexual Activity   Alcohol use: No   Drug use: No   Sexual activity: Not Currently  Other Topics Concern   Not on file  Social History Narrative   Not on file   Social Determinants of Health   Financial Resource Strain: Low Risk  (07/26/2022)   Overall Financial Resource Strain (CARDIA)    Difficulty of Paying Living Expenses: Not hard at all  Food Insecurity: No Food Insecurity (01/31/2022)   Hunger Vital Sign    Worried About Running Out of Food in the Last Year: Never true    Ran Out of Food in the Last Year: Never true  Transportation Needs:  No Transportation Needs (01/31/2022)   PRAPARE - Administrator, Civil Service (Medical): No    Lack of Transportation (Non-Medical): No  Physical Activity: Inactive (11/26/2022)   Exercise Vital Sign    Days of Exercise per Week: 0 days    Minutes of Exercise per Session: 0 min  Stress: No Stress Concern Present (01/31/2022)   [employer] of Occupational Health - Occupational Stress Questionnaire    Feeling of Stress : Only a little  Social Connections: Moderately Isolated (07/26/2022)   Social Connection and Isolation Panel [NHANES]    Frequency of Communication with Friends and Family: More than three times a week    Frequency of Social Gatherings with Friends and Family: More than three times a week    Attends Religious Services: More than 4 times per year    Active Member of [employer] or Organizations: No    Attends Banker Meetings: Never    Marital Status: Widowed    Objective:  BP 124/64   Pulse 62   Temp 98.1 F (36.7 C)   Ht 5' 1.5" (1.562 m)   Wt 193 lb (87.5 kg)   SpO2 99%   BMI 35.88 kg/m      11/28/2022    1:45 PM 11/28/2022   12:43 PM 11/26/2022   11:50 AM  BP/Weight  Systolic BP 120 157 124  Diastolic BP 81 77 64  Wt. (Lbs)  196.04 193  BMI  36.44 kg/m2 35.88 kg/m2    Physical Exam Vitals reviewed.  Constitutional:      Appearance: Normal appearance. She is normal weight.  Neck:     Vascular: No carotid bruit.  Cardiovascular:     Rate and Rhythm: Normal rate and regular rhythm.     Heart sounds: Normal heart sounds.  Pulmonary:     Effort: Pulmonary effort is normal. No respiratory distress.     Breath sounds: Normal breath sounds.  Abdominal:     General: Abdomen is flat. Bowel sounds are normal.     Palpations: Abdomen is soft.     Tenderness: There is no abdominal tenderness.  Neurological:     Mental Status: She is alert and oriented to person, place, and time.  Psychiatric:        Mood and Affect: Mood  normal.         Behavior: Behavior normal.     Diabetic Foot Exam - Simple   No data filed      Lab Results  Component Value Date   WBC 7.1 11/19/2022   HGB 8.4 (A) 11/19/2022   HCT 26 (A) 11/19/2022   PLT 383 11/19/2022   GLUCOSE 86 11/26/2022   CHOL 226 (H) 11/26/2022   TRIG 45 11/26/2022   HDL 72 11/26/2022   LDLCALC 146 (H) 11/26/2022   ALT 9 11/26/2022   AST 20 11/26/2022   NA 146 (H) 11/26/2022   K 4.6 11/26/2022   CL 105 11/26/2022   CREATININE 1.23 (H) 11/26/2022   BUN 25 11/26/2022   CO2 24 11/26/2022   TSH 0.341 (L) 11/26/2022   INR 1.0 03/21/2020      Assessment & Plan:    Mixed hyperlipidemia Assessment & Plan: Recommend continue to work on eating healthy diet and exercise.  Check lipids  Orders: -     Comprehensive metabolic panel -     Lipid panel  Colovaginal fistula Assessment & Plan: Patient is seeing GI. Prefers not to have surgically fixed.    Other iron deficiency anemia Assessment & Plan: Check labs   Bilateral primary osteoarthritis of knee Assessment & Plan: Recommend tylenol   Essential hypertension Assessment & Plan: The current medical regimen is effective;  continue present plan and medications. Check labs.  Continue maxzide  Orders: -     Comprehensive metabolic panel -     Lipid panel -     TSH  Acute cystitis without hematuria Assessment & Plan: Check UA.   Orders: -     POCT URINALYSIS DIP (CLINITEK)  Stage 3b chronic kidney disease (HCC) Assessment & Plan: Check labs      No orders of the defined types were placed in this encounter.   Orders Placed This Encounter  Procedures   Comprehensive metabolic panel   Lipid panel   TSH   POCT URINALYSIS DIP (CLINITEK)     Follow-up: Return in about 3 months (around 02/26/2023) for chronic, fasting.   I,Katherina A Bramblett,acting as a scribe for Blane Ohara, MD.,have documented all relevant documentation on the behalf of Blane Ohara, MD,as directed by   Blane Ohara, MD while in the presence of Blane Ohara, MD.   An After Visit Summary was printed and given to the patient.  Blane Ohara, MD [facility] [phone]

## 2022-11-26 NOTE — Assessment & Plan Note (Addendum)
Recommend continue to work on eating healthy diet and exercise. Check lipids.  

## 2022-11-26 NOTE — Assessment & Plan Note (Addendum)
The current medical regimen is effective;  continue present plan and medications. Check labs.  Continue maxzide

## 2022-11-27 LAB — COMPREHENSIVE METABOLIC PANEL
ALT: 9 IU/L (ref 0–32)
AST: 20 IU/L (ref 0–40)
Albumin: 4 g/dL (ref 3.7–4.7)
Alkaline Phosphatase: 62 IU/L (ref 44–121)
BUN/Creatinine Ratio: 20 (ref 12–28)
BUN: 25 mg/dL (ref 8–27)
Bilirubin Total: 0.3 mg/dL (ref 0.0–1.2)
CO2: 24 mmol/L (ref 20–29)
Calcium: 9.6 mg/dL (ref 8.7–10.3)
Chloride: 105 mmol/L (ref 96–106)
Creatinine, Ser: 1.23 mg/dL — ABNORMAL HIGH (ref 0.57–1.00)
Globulin, Total: 2.6 g/dL (ref 1.5–4.5)
Glucose: 86 mg/dL (ref 70–99)
Potassium: 4.6 mmol/L (ref 3.5–5.2)
Sodium: 146 mmol/L — ABNORMAL HIGH (ref 134–144)
Total Protein: 6.6 g/dL (ref 6.0–8.5)
eGFR: 44 mL/min/{1.73_m2} — ABNORMAL LOW (ref >59)

## 2022-11-27 LAB — LIPID PANEL
Chol/HDL Ratio: 3.1 ratio (ref 0.0–4.4)
Cholesterol, Total: 226 mg/dL — ABNORMAL HIGH (ref 100–199)
HDL: 72 mg/dL (ref >39)
LDL Chol Calc (NIH): 146 mg/dL — ABNORMAL HIGH (ref 0–99)
Triglycerides: 45 mg/dL (ref 0–149)
VLDL Cholesterol Cal: 8 mg/dL (ref 5–40)

## 2022-11-27 LAB — TSH: TSH: 0.341 u[IU]/mL — ABNORMAL LOW (ref 0.450–4.500)

## 2022-11-27 MED FILL — Ferumoxytol Inj 510 MG/17ML (30 MG/ML) (Elemental Fe): INTRAVENOUS | Qty: 17 | Status: AC

## 2022-11-28 ENCOUNTER — Inpatient Hospital Stay: Payer: Medicare Other

## 2022-11-28 VITALS — BP 120/81 | HR 54 | Resp 18 | Ht 61.5 in | Wt 196.0 lb

## 2022-11-28 DIAGNOSIS — D509 Iron deficiency anemia, unspecified: Secondary | ICD-10-CM | POA: Diagnosis not present

## 2022-11-28 DIAGNOSIS — D508 Other iron deficiency anemias: Secondary | ICD-10-CM

## 2022-11-28 MED ORDER — SODIUM CHLORIDE 0.9 % IV SOLN
510.0000 mg | Freq: Once | INTRAVENOUS | Status: AC
  Administered 2022-11-28: 510 mg via INTRAVENOUS
  Filled 2022-11-28: qty 510

## 2022-11-28 MED ORDER — SODIUM CHLORIDE 0.9 % IV SOLN: Freq: Once | INTRAVENOUS | Status: AC

## 2022-11-28 NOTE — Patient Instructions (Signed)

## 2022-12-02 NOTE — Assessment & Plan Note (Signed)
Patient is seeing GI. Prefers not to have surgically fixed.

## 2022-12-02 NOTE — Assessment & Plan Note (Signed)
Check UA 

## 2022-12-02 NOTE — Assessment & Plan Note (Signed)
Recommend tylenol.  

## 2022-12-02 NOTE — Assessment & Plan Note (Signed)
Check labs 

## 2022-12-16 ENCOUNTER — Other Ambulatory Visit: Payer: Self-pay

## 2022-12-16 DIAGNOSIS — N289 Disorder of kidney and ureter, unspecified: Secondary | ICD-10-CM

## 2022-12-18 DIAGNOSIS — K921 Melena: Secondary | ICD-10-CM | POA: Diagnosis not present

## 2023-01-01 DIAGNOSIS — Z980 Intestinal bypass and anastomosis status: Secondary | ICD-10-CM | POA: Diagnosis not present

## 2023-01-01 DIAGNOSIS — K635 Polyp of colon: Secondary | ICD-10-CM | POA: Diagnosis not present

## 2023-01-01 DIAGNOSIS — D128 Benign neoplasm of rectum: Secondary | ICD-10-CM | POA: Diagnosis not present

## 2023-01-01 DIAGNOSIS — K5140 Inflammatory polyps of colon without complications: Secondary | ICD-10-CM | POA: Diagnosis not present

## 2023-01-01 DIAGNOSIS — Z9889 Other specified postprocedural states: Secondary | ICD-10-CM | POA: Diagnosis not present

## 2023-01-01 DIAGNOSIS — K6389 Other specified diseases of intestine: Secondary | ICD-10-CM | POA: Diagnosis not present

## 2023-01-01 DIAGNOSIS — K5730 Diverticulosis of large intestine without perforation or abscess without bleeding: Secondary | ICD-10-CM | POA: Diagnosis not present

## 2023-01-01 DIAGNOSIS — K921 Melena: Secondary | ICD-10-CM | POA: Diagnosis not present

## 2023-01-06 ENCOUNTER — Other Ambulatory Visit: Payer: Self-pay

## 2023-01-06 ENCOUNTER — Telehealth: Payer: Self-pay

## 2023-01-06 DIAGNOSIS — N289 Disorder of kidney and ureter, unspecified: Secondary | ICD-10-CM

## 2023-01-06 NOTE — Telephone Encounter (Signed)
Order was placed 12/16/22. Unsure how or why it did not show up in referrals.

## 2023-01-06 NOTE — Telephone Encounter (Signed)
The patient called this afternoon stating that she was told our office would do a referral for Nephrology. I spoke with Amber, and a referral was never sent to her about this. Dr. Sedalia Muta can you please put the referral into the chart. The patient is requesting the location to be in [facility].

## 2023-01-07 ENCOUNTER — Other Ambulatory Visit: Payer: Self-pay

## 2023-01-07 NOTE — Telephone Encounter (Signed)
Thank you :)

## 2023-01-30 DIAGNOSIS — M170 Bilateral primary osteoarthritis of knee: Secondary | ICD-10-CM | POA: Diagnosis not present

## 2023-01-30 DIAGNOSIS — R262 Difficulty in walking, not elsewhere classified: Secondary | ICD-10-CM | POA: Diagnosis not present

## 2023-01-30 DIAGNOSIS — Z1231 Encounter for screening mammogram for malignant neoplasm of breast: Secondary | ICD-10-CM | POA: Diagnosis not present

## 2023-01-30 DIAGNOSIS — M25562 Pain in left knee: Secondary | ICD-10-CM | POA: Diagnosis not present

## 2023-01-30 DIAGNOSIS — M25561 Pain in right knee: Secondary | ICD-10-CM | POA: Diagnosis not present

## 2023-01-30 LAB — HM MAMMOGRAPHY

## 2023-02-06 DIAGNOSIS — M170 Bilateral primary osteoarthritis of knee: Secondary | ICD-10-CM | POA: Diagnosis not present

## 2023-02-06 DIAGNOSIS — R262 Difficulty in walking, not elsewhere classified: Secondary | ICD-10-CM | POA: Diagnosis not present

## 2023-02-06 DIAGNOSIS — M25562 Pain in left knee: Secondary | ICD-10-CM | POA: Diagnosis not present

## 2023-02-06 DIAGNOSIS — M25561 Pain in right knee: Secondary | ICD-10-CM | POA: Diagnosis not present

## 2023-02-13 ENCOUNTER — Ambulatory Visit (INDEPENDENT_AMBULATORY_CARE_PROVIDER_SITE_OTHER): Payer: Medicare Other

## 2023-02-13 VITALS — BP 130/72 | HR 68 | Resp 16 | Ht 61.5 in | Wt 190.0 lb

## 2023-02-13 DIAGNOSIS — Z2821 Immunization not carried out because of patient refusal: Secondary | ICD-10-CM | POA: Diagnosis not present

## 2023-02-13 DIAGNOSIS — N959 Unspecified menopausal and perimenopausal disorder: Secondary | ICD-10-CM

## 2023-02-13 DIAGNOSIS — Z0000 Encounter for general adult medical examination without abnormal findings: Secondary | ICD-10-CM | POA: Diagnosis not present

## 2023-02-13 DIAGNOSIS — Z9189 Other specified personal risk factors, not elsewhere classified: Secondary | ICD-10-CM | POA: Diagnosis not present

## 2023-02-13 NOTE — Progress Notes (Signed)
Subjective:   Ashley Ritter is a 82 y.o. female who presents for Medicare Annual (Subsequent) preventive examination.  This wellness visit is conducted by a nurse.  The patient's medications were reviewed and reconciled since the patient's last visit.  History details were provided by the patient.  The history appears to be reliable.    Patient's last AWV was one year ago.   Medical History: Patient history and Family history was reviewed  Medications, Allergies, and preventative health maintenance was reviewed and updated.   Visit Complete: In person  Cardiac Risk Factors include: advanced age (>43men, >90 women);obesity (BMI >30kg/m2)     Objective:    Today's Vitals   02/13/23 1044  BP: 130/72  Pulse: 68  Resp: 16  Weight: 190 lb (86.2 kg)  Height: 5' 1.5" (1.562 m)  PainSc: 0-No pain   Body mass index is 35.32 kg/m.     11/21/2022    1:32 PM 11/19/2022    1:50 PM 09/17/2022    3:09 PM 03/14/2022    2:19 PM 01/31/2022    3:26 PM 09/11/2021    2:26 PM 03/12/2021    2:28 PM  Advanced Directives  Does Patient Have a Medical Advance Directive? No No No No No No No  Would patient like information on creating a medical advance directive?     Yes (MAU/Ambulatory/Procedural Areas - Information given)      Current Medications (verified) Outpatient Encounter Medications as of 02/13/2023  Medication Sig   aspirin EC 81 MG tablet Take 81 mg by mouth daily. Swallow whole.   ferrous fumarate-iron polysaccharide complex (TANDEM) 162-115.2 MG CAPS capsule Take 1 capsule by mouth daily.   Garlic (GARLIQUE) 400 MG TBEC Take 400 mg by mouth daily.   Multiple Vitamin (MULTIVITAMIN ADULT PO) Take 1 tablet by mouth daily.   TANDEM 53-53 MG CAPS TAKE 1 CAPSULE BY MOUTH EVERY DAY   triamterene-hydrochlorothiazide (MAXZIDE-25) 37.5-25 MG tablet Take 1 tablet by mouth daily.   VITAMIN D PO Take 1 tablet by mouth daily. Unknown strength   No facility-administered encounter medications on  file as of 02/13/2023.    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   Syncope 11/03/2016   Vertigo    Past Surgical History:  Procedure Laterality Date   ABDOMINAL HYSTERECTOMY     GASTRIC RESTRICTION SURGERY     SHOULDER SURGERY     TONSILLECTOMY     TOTAL HIP ARTHROPLASTY Right 04/23/2017   Procedure: RIGHT TOTAL HIP ARTHROPLASTY ANTERIOR APPROACH;  Surgeon: Ollen Gross, MD;  Location: [facility];  Service: Orthopedics;  Laterality: Right;   TOTAL HIP ARTHROPLASTY Left 03/29/2020   Procedure: TOTAL HIP ARTHROPLASTY ANTERIOR APPROACH;  Surgeon: Ollen Gross, MD;  Location: [facility];  Service: Orthopedics;  Laterality: Left;    TUBAL LIGATION     Family History  Problem Relation Age of Onset  Diabetes Mother    Heart attack Father    Heart disease Father    Hyperlipidemia Brother    Hypertension Brother    Dementia Brother    Social History   Socioeconomic History   Marital status: Widowed    Spouse name: Not on file   Number of children: Not on file   Years of education: Not on file   Highest education level: Not on file  Occupational History   Occupation: Retired Producer, television/film/video  Tobacco Use   Smoking status: Former    Current packs/day: 0.00    Types: Cigarettes    Quit date: 1983    Years since quitting: 41.7   Smokeless tobacco: Never  Vaping Use   Vaping status: Never Used   Substance and Sexual Activity   Alcohol use: No   Drug use: No   Sexual activity: Not Currently  Other Topics Concern   Not on file  Social History Narrative   Not on file   Social Determinants of Health   Financial Resource Strain: Low Risk  (02/13/2023)   Overall Financial Resource Strain (CARDIA)    Difficulty of Paying Living Expenses: Not hard at all  Food Insecurity: No Food Insecurity (02/13/2023)   Hunger Vital Sign    Worried About Running Out of Food in the Last Year: Never true    Ran Out of Food in the Last Year: Never true  Transportation Needs: No Transportation Needs (02/13/2023)   PRAPARE - Administrator, Civil Service (Medical): No    Lack of Transportation (Non-Medical): No  Physical Activity: Inactive (02/13/2023)   Exercise Vital Sign    Days of Exercise per Week: 0 days    Minutes of Exercise per Session: 0 min  Stress: No Stress Concern Present (02/13/2023)   [employer] of Occupational Health - Occupational Stress Questionnaire    Feeling of Stress : Not at all  Social Connections: Moderately Isolated (02/13/2023)   Social Connection and Isolation Panel [NHANES]    Frequency of Communication with Friends and Family: More than three times a week    Frequency of Social Gatherings with Friends and Family: More than three times a week    Attends Religious Services: More than 4 times per year    Active Member of [employer] or Organizations: No    Attends Banker Meetings: Never    Marital Status: Widowed    Tobacco Counseling Counseling given: Not Answered   Clinical Intake:  Pre-visit preparation completed: Yes Pain : No/denies pain Pain Score: 0-No pain   BMI - recorded: 35.32 Nutritional Status: BMI > 30  Obese Nutritional Risks: None Diabetes: No How often do you need to have someone help you when you read instructions, pamphlets, or other written materials from your doctor or pharmacy?: 1 - Never Interpreter Needed?: No      Activities of Daily Living    02/13/2023   11:40 AM 11/26/2022   11:49 AM  In your present state of health, do you have any difficulty performing the following activities:  Hearing? 0 0  Vision? 0 0  Difficulty concentrating or making decisions? 0 0  Walking or climbing stairs? 1 0  Dressing or bathing? 0 0  Doing errands, shopping? 0 0  Preparing Food and eating ? N   Using the Toilet? N   In the past six months, have you accidently leaked urine? N   Do you have problems with loss of bowel control? N  Managing your Medications? N   Managing your Finances? N   Housekeeping or managing your Housekeeping? N     Patient Care Team: Blane Ohara, MD as PCP - General (Internal Medicine) Georgeanna Lea, MD as PCP - Cardiology (Cardiology) Weston Settle, MD as Consulting Physician (Hematology and Oncology)     Assessment:   This is a routine wellness examination for Toniya.  Hearing/Vision screen No results found.  Depression Screen    02/13/2023   11:37 AM 11/26/2022   11:43 AM 07/26/2022   10:46 AM 03/07/2022   11:13 AM 01/31/2022    2:52 PM 12/03/2021   11:19 AM 12/28/2020   11:04 AM  PHQ 2/9 Scores  PHQ - 2 Score 0 0 0 0 0 0 0    Fall Risk    02/13/2023   10:54 AM 11/26/2022   11:43 AM 07/26/2022   10:46 AM 03/07/2022   11:13 AM 01/31/2022    3:26 PM  Fall Risk   Falls in the past year? 0 0 0 0 1  Number falls in past yr: 0 0 0 0 0  Injury with Fall? 0 0 0 0 0  Risk for fall due to : No Fall Risks Impaired balance/gait Impaired balance/gait No Fall Risks History of fall(s)  Follow up Falls evaluation completed;Education provided;Falls prevention discussed Falls evaluation completed Falls evaluation completed Falls evaluation completed Falls evaluation completed;Education provided    MEDICARE RISK AT HOME: Medicare Risk at Home Any stairs in or around the home?: Yes If so, are there any without handrails?: No Home free of loose throw rugs in walkways, pet  beds, electrical cords, etc?: Yes Adequate lighting in your home to reduce risk of falls?: Yes Life alert?: No Use of a cane, walker or w/c?: No Grab bars in the bathroom?: Yes Shower chair or bench in shower?: No Elevated toilet seat or a handicapped toilet?: No  TIMED UP AND GO:  Was the test performed?  Yes  Length of time to ambulate 10 feet: 11 sec Gait slow and steady with assistive device    Cognitive Function:        02/13/2023   11:40 AM 01/31/2022    3:28 PM  6CIT Screen  What Year? 0 points 0 points  What month? 0 points 0 points  What time? 0 points 0 points  Count back from 20 0 points 0 points  Months in reverse 0 points 2 points  Repeat phrase 2 points 2 points  Total Score 2 points 4 points    Immunizations Immunization History  Administered Date(s) Administered   COVID-19, mRNA, vaccine(Comirnaty)12 years and older 06/14/2022   Moderna Sars-Covid-2 Vaccination 09/08/2019, 09/23/2019   PFIZER(Purple Top)SARS-COV-2 Vaccination 03/23/2020   Pneumococcal Polysaccharide-23 08/30/2011    TDAP status: Due, Education has been provided regarding the importance of this vaccine. Advised may receive this vaccine at local pharmacy or Health Dept. Aware to provide a copy of the vaccination record if obtained from local pharmacy or Health Dept. Verbalized acceptance and understanding.  Flu Vaccine status: Declined, Education has been provided regarding the importance of this vaccine but patient still declined. Advised may receive this vaccine at local pharmacy or Health Dept. Aware to provide a copy of the vaccination record if obtained from local pharmacy or Health Dept. Verbalized acceptance and understanding.  Pneumococcal vaccine status: Due, Education has been provided regarding the importance of this vaccine. Advised may receive this vaccine at local pharmacy or Health Dept. Aware to provide  a copy of the vaccination record if obtained from local pharmacy or Health  Dept. Verbalized acceptance and understanding.  Covid-19 vaccine status: Declined, Education has been provided regarding the importance of this vaccine but patient still declined. Advised may receive this vaccine at local pharmacy or Health Dept.or vaccine clinic. Aware to provide a copy of the vaccination record if obtained from local pharmacy or Health Dept. Verbalized acceptance and understanding.  Qualifies for Shingles Vaccine? Yes   Zostavax completed No   Shingrix Completed?: No.    Education has been provided regarding the importance of this vaccine. Patient has been advised to call insurance company to determine out of pocket expense if they have not yet received this vaccine. Advised may also receive vaccine at local pharmacy or Health Dept. Verbalized acceptance and understanding.  Screening Tests Health Maintenance  Topic Date Due   DTaP/Tdap/Td (1 - Tdap) Never done   Zoster Vaccines- Shingrix (1 of 2) Never done   DEXA SCAN  Never done   Pneumonia Vaccine 48+ Years old (2 of 2 - PCV) 08/29/2012   Medicare Annual Wellness (AWV)  01/30/2023   INFLUENZA VACCINE  09/08/2023 (Originally 01/09/2023)   MAMMOGRAM  01/29/2025   HPV VACCINES  Aged Out   COVID-19 Vaccine  Discontinued    Health Maintenance  Health Maintenance Due  Topic Date Due   DTaP/Tdap/Td (1 - Tdap) Never done   Zoster Vaccines- Shingrix (1 of 2) Never done   DEXA SCAN  Never done   Pneumonia Vaccine 1+ Years old (2 of 2 - PCV) 08/29/2012   Medicare Annual Wellness (AWV)  01/30/2023    Colorectal cancer screening: No longer required.   Mammogram status: Completed 01/2023. Repeat every year  Bone Density status: Ordered  Lung Cancer Screening: (Low Dose CT Chest recommended if Age 34-80 years, 20 pack-year currently smoking OR have quit w/in 15years.) does not qualify.   Additional Screening:  Vision Screening: Recommended annual ophthalmology exams for early detection of glaucoma and other disorders  of the eye. Is the patient up to date with their annual eye exam?  Yes  Who is the provider or what is the name of the office in which the patient attends annual eye exams? Dr Hyacinth Meeker  Dental Screening: Recommended annual dental exams for proper oral hygiene   Community Resource Referral / Chronic Care Management: CRR required this visit?  No   CCM required this visit?  No     Plan:    1- Patient declined Flu vaccine 2- Patient will come back for Pneumonia vaccine  3- DEXA ordered  I have personally reviewed and noted the following in the patient's chart:   Medical and social history Use of alcohol, tobacco or illicit drugs  Current medications and supplements including opioid prescriptions. Patient is not currently taking opioid prescriptions. Functional ability and status Nutritional status Physical activity Advanced directives List of other physicians Hospitalizations, surgeries, and ER visits in previous 12 months Vitals Screenings to include cognitive, depression, and falls Referrals and appointments  In addition, I have reviewed and discussed with patient certain preventive protocols, quality metrics, and best practice recommendations. A written personalized care plan for preventive services as well as general preventive health recommendations were provided to patient.     Jacklynn Bue, LPN   06/15/1094

## 2023-02-13 NOTE — Patient Instructions (Signed)
Ashley Ritter , Thank you for taking time to come for your Medicare Wellness Visit. I appreciate your ongoing commitment to your health goals. Please review the following plan we discussed and let me know if I can assist you in the future.    This is a list of the screening recommended for you and due dates:  Health Maintenance  Topic Date Due   DTaP/Tdap/Td vaccine (1 - Tdap) Never done   Zoster (Shingles) Vaccine (1 of 2) Never done   DEXA scan (bone density measurement)  Never done   Pneumonia Vaccine (2 of 2 - PCV) 08/29/2012   Flu Shot  09/08/2023*   Medicare Annual Wellness Visit  02/13/2024   Mammogram  01/29/2025   HPV Vaccine  Aged Out   COVID-19 Vaccine  Discontinued  *Topic was postponed. The date shown is not the original due date.    Preventive Care 82 Years and Older, Female Preventive care refers to lifestyle choices and visits with your health care provider that can promote health and wellness. What does preventive care include? A yearly physical exam. This is also called an annual well check. Dental exams once or twice a year. Routine eye exams. Ask your health care provider how often you should have your eyes checked. Personal lifestyle choices, including: Daily care of your teeth and gums. Regular physical activity. Eating a healthy diet. Avoiding tobacco and drug use. Limiting alcohol use. Practicing safe sex. Taking low-dose aspirin every day. Taking vitamin and mineral supplements as recommended by your health care provider. What happens during an annual well check? The services and screenings done by your health care provider during your annual well check will depend on your age, overall health, lifestyle risk factors, and family history of disease. Counseling  Your health care provider may ask you questions about your: Alcohol use. Tobacco use. Drug use. Emotional well-being. Home and relationship well-being. Sexual activity. Eating habits. History of  falls. Memory and ability to understand (cognition). Work and work Astronomer. Reproductive health. Screening  You may have the following tests or measurements: Height, weight, and BMI. Blood pressure. Lipid and cholesterol levels. These may be checked every 5 years, or more frequently if you are over 26 years old. Skin check. Lung cancer screening. You may have this screening every year starting at age 65 if you have a 30-pack-year history of smoking and currently smoke or have quit within the past 15 years. Fecal occult blood test (FOBT) of the stool. You may have this test every year starting at age 20. Flexible sigmoidoscopy or colonoscopy. You may have a sigmoidoscopy every 5 years or a colonoscopy every 10 years starting at age 35. Hepatitis C blood test. Hepatitis B blood test. Sexually transmitted disease (STD) testing. Diabetes screening. This is done by checking your blood sugar (glucose) after you have not eaten for a while (fasting). You may have this done every 1-3 years. Bone density scan. This is done to screen for osteoporosis. You may have this done starting at age 45. Mammogram. This may be done every 1-2 years. Talk to your health care provider about how often you should have regular mammograms. Talk with your health care provider about your test results, treatment options, and if necessary, the need for more tests. Vaccines  Your health care provider may recommend certain vaccines, such as: Influenza vaccine. This is recommended every year. Tetanus, diphtheria, and acellular pertussis (Tdap, Td) vaccine. You may need a Td booster every 10 years. Zoster vaccine. You  may need this after age 25. Pneumococcal 13-valent conjugate (PCV13) vaccine. One dose is recommended after age 25. Pneumococcal polysaccharide (PPSV23) vaccine. One dose is recommended after age 65. Talk to your health care provider about which screenings and vaccines you need and how often you need  them. This information is not intended to replace advice given to you by your health care provider. Make sure you discuss any questions you have with your health care provider. Document Released: 06/23/2015 Document Revised: 02/14/2016 Document Reviewed: 03/28/2015 Elsevier Interactive Patient Education  2017 [employer].  Fall Prevention in the Home Falls can cause injuries. They can happen to people of all ages. There are many things you can do to make your home safe and to help prevent falls. What can I do on the outside of my home? Regularly fix the edges of walkways and driveways and fix any cracks. Remove anything that might make you trip as you walk through a door, such as a raised step or threshold. Trim any bushes or trees on the path to your home. Use bright outdoor lighting. Clear any walking paths of anything that might make someone trip, such as rocks or tools. Regularly check to see if handrails are loose or broken. Make sure that both sides of any steps have handrails. Any raised decks and porches should have guardrails on the edges. Have any leaves, snow, or ice cleared regularly. Use sand or salt on walking paths during winter. Clean up any spills in your garage right away. This includes oil or grease spills. What can I do in the bathroom? Use night lights. Install grab bars by the toilet and in the tub and shower. Do not use towel bars as grab bars. Use non-skid mats or decals in the tub or shower. If you need to sit down in the shower, use a plastic, non-slip stool. Keep the floor dry. Clean up any water that spills on the floor as soon as it happens. Remove soap buildup in the tub or shower regularly. Attach bath mats securely with double-sided non-slip rug tape. Do not have throw rugs and other things on the floor that can make you trip. What can I do in the bedroom? Use night lights. Make sure that you have a light by your bed that is easy to reach. Do not use  any sheets or blankets that are too big for your bed. They should not hang down onto the floor. Have a firm chair that has side arms. You can use this for support while you get dressed. Do not have throw rugs and other things on the floor that can make you trip. What can I do in the kitchen? Clean up any spills right away. Avoid walking on wet floors. Keep items that you use a lot in easy-to-reach places. If you need to reach something above you, use a strong step stool that has a grab bar. Keep electrical cords out of the way. Do not use floor polish or wax that makes floors slippery. If you must use wax, use non-skid floor wax. Do not have throw rugs and other things on the floor that can make you trip. What can I do with my stairs? Do not leave any items on the stairs. Make sure that there are handrails on both sides of the stairs and use them. Fix handrails that are broken or loose. Make sure that handrails are as long as the stairways. Check any carpeting to make sure that it is firmly  attached to the stairs. Fix any carpet that is loose or worn. Avoid having throw rugs at the top or bottom of the stairs. If you do have throw rugs, attach them to the floor with carpet tape. Make sure that you have a light switch at the top of the stairs and the bottom of the stairs. If you do not have them, ask someone to add them for you. What else can I do to help prevent falls? Wear shoes that: Do not have high heels. Have rubber bottoms. Are comfortable and fit you well. Are closed at the toe. Do not wear sandals. If you use a stepladder: Make sure that it is fully opened. Do not climb a closed stepladder. Make sure that both sides of the stepladder are locked into place. Ask someone to hold it for you, if possible. Clearly mark and make sure that you can see: Any grab bars or handrails. First and last steps. Where the edge of each step is. Use tools that help you move around (mobility aids)  if they are needed. These include: Canes. Walkers. Scooters. Crutches. Turn on the lights when you go into a dark area. Replace any light bulbs as soon as they burn out. Set up your furniture so you have a clear path. Avoid moving your furniture around. If any of your floors are uneven, fix them. If there are any pets around you, be aware of where they are. Review your medicines with your doctor. Some medicines can make you feel dizzy. This can increase your chance of falling. Ask your doctor what other things that you can do to help prevent falls. This information is not intended to replace advice given to you by your health care provider. Make sure you discuss any questions you have with your health care provider. Document Released: 03/23/2009 Document Revised: 11/02/2015 Document Reviewed: 07/01/2014 Elsevier Interactive Patient Education  2017 [employer].

## 2023-02-19 ENCOUNTER — Other Ambulatory Visit: Payer: Medicare Other

## 2023-02-19 ENCOUNTER — Ambulatory Visit: Payer: Medicare Other | Admitting: Oncology

## 2023-02-20 ENCOUNTER — Ambulatory Visit: Payer: Medicare Other | Admitting: Oncology

## 2023-02-20 ENCOUNTER — Other Ambulatory Visit: Payer: Medicare Other

## 2023-02-20 DIAGNOSIS — M170 Bilateral primary osteoarthritis of knee: Secondary | ICD-10-CM | POA: Diagnosis not present

## 2023-02-20 DIAGNOSIS — M25561 Pain in right knee: Secondary | ICD-10-CM | POA: Diagnosis not present

## 2023-02-20 DIAGNOSIS — M25562 Pain in left knee: Secondary | ICD-10-CM | POA: Diagnosis not present

## 2023-02-20 DIAGNOSIS — R262 Difficulty in walking, not elsewhere classified: Secondary | ICD-10-CM | POA: Diagnosis not present

## 2023-02-24 ENCOUNTER — Telehealth: Payer: Self-pay | Admitting: Family Medicine

## 2023-02-24 NOTE — Telephone Encounter (Signed)
   Ashley Ritter has been scheduled for the following appointment:  WHAT: BONE DENSITY WHERE: [facility] DATE: 03/19/23 TIME: 8:30 AM CHECK-IN  A message has been left for the patient.

## 2023-02-26 ENCOUNTER — Other Ambulatory Visit: Payer: Self-pay

## 2023-02-26 DIAGNOSIS — N959 Unspecified menopausal and perimenopausal disorder: Secondary | ICD-10-CM

## 2023-02-27 DIAGNOSIS — M25561 Pain in right knee: Secondary | ICD-10-CM | POA: Diagnosis not present

## 2023-02-27 DIAGNOSIS — M170 Bilateral primary osteoarthritis of knee: Secondary | ICD-10-CM | POA: Diagnosis not present

## 2023-02-27 DIAGNOSIS — R262 Difficulty in walking, not elsewhere classified: Secondary | ICD-10-CM | POA: Diagnosis not present

## 2023-02-27 DIAGNOSIS — M25562 Pain in left knee: Secondary | ICD-10-CM | POA: Diagnosis not present

## 2023-03-03 DIAGNOSIS — M170 Bilateral primary osteoarthritis of knee: Secondary | ICD-10-CM | POA: Diagnosis not present

## 2023-03-03 NOTE — Progress Notes (Unsigned)
Subjective:  Patient ID: Ashley Ritter, female    DOB: [birth year]  Age: 82 y.o. MRN: [id number]  Chief Complaint  Patient presents with   Medical Management of Chronic Issues    HPI  Hyperlipidemia: Current medications: None Eating healthy and exercising.    Anemia: sees Dr. Melvyn Neth. Iron deficiency.    Hypertension: Complications: CKD 3B Current medications: triamterene/hct 37.5 mg/25 mg once daily.    Tries to eat healthy, but would like to get some more information.  She is not exercising.     02/13/2023   11:37 AM 11/26/2022   11:43 AM 07/26/2022   10:46 AM 03/07/2022   11:13 AM 01/31/2022    2:52 PM  Depression screen PHQ 2/9  Decreased Interest 0 0 0 0 0  Down, Depressed, Hopeless 0 0 0 0 0  PHQ - 2 Score 0 0 0 0 0        02/13/2023   10:54 AM  Fall Risk   Falls in the past year? 0  Number falls in past yr: 0  Injury with Fall? 0  Risk for fall due to : No Fall Risks  Follow up Falls evaluation completed;Education provided;Falls prevention discussed    Patient Care Team: Blane Ohara, MD as PCP - General (Internal Medicine) Georgeanna Lea, MD as PCP - Cardiology (Cardiology) Weston Settle, MD as Consulting Physician (Hematology and Oncology)   Review of Systems  Constitutional:  Negative for chills, fatigue and fever.  HENT:  Negative for congestion, ear pain, rhinorrhea and sore throat.   Respiratory:  Negative for cough and shortness of breath.   Cardiovascular:  Negative for chest pain.  Gastrointestinal:  Negative for abdominal pain, constipation, diarrhea, nausea and vomiting.  Genitourinary:  Negative for dysuria and urgency.  Musculoskeletal:  Positive for arthralgias (left knee pain). Negative for back pain and myalgias.  Neurological:  Negative for dizziness, weakness, light-headedness and headaches.  Psychiatric/Behavioral:  Negative for dysphoric mood. The patient is not nervous/anxious.     Current Outpatient Medications on  File Prior to Visit  Medication Sig Dispense Refill   aspirin EC 81 MG tablet Take 81 mg by mouth daily. Swallow whole.     ferrous fumarate-iron polysaccharide complex (TANDEM) 162-115.2 MG CAPS capsule Take 1 capsule by mouth daily.     Garlic (GARLIQUE) 400 MG TBEC Take 400 mg by mouth daily.     Multiple Vitamin (MULTIVITAMIN ADULT PO) Take 1 tablet by mouth daily.     triamterene-hydrochlorothiazide (MAXZIDE-25) 37.5-25 MG tablet Take 1 tablet by mouth daily. 90 tablet 3   VITAMIN D PO Take 1 tablet by mouth daily. Unknown strength     No current facility-administered medications on file prior to visit.   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   Syncope 11/03/2016   Vertigo    Past Surgical History:  Procedure Laterality Date   ABDOMINAL HYSTERECTOMY  GASTRIC RESTRICTION SURGERY     SHOULDER SURGERY     TONSILLECTOMY     TOTAL HIP ARTHROPLASTY Right 04/23/2017   Procedure: RIGHT TOTAL HIP ARTHROPLASTY ANTERIOR APPROACH;  Surgeon: Ollen Gross, MD;  Location: [facility];  Service: Orthopedics;  Laterality: Right;   TOTAL HIP ARTHROPLASTY Left 03/29/2020   Procedure: TOTAL HIP ARTHROPLASTY ANTERIOR APPROACH;  Surgeon: Ollen Gross, MD;  Location: [facility];  Service: Orthopedics;  Laterality: Left;    TUBAL LIGATION      Family History  Problem Relation Age of Onset   Diabetes Mother    Heart attack Father    Heart disease Father    Hyperlipidemia  Brother    Hypertension Brother    Dementia Brother    Social History   Socioeconomic History   Marital status: Widowed    Spouse name: Not on file   Number of children: Not on file   Years of education: Not on file   Highest education level: Not on file  Occupational History   Occupation: Retired Producer, television/film/video  Tobacco Use   Smoking status: Former    Current packs/day: 0.00    Types: Cigarettes    Quit date: 1983    Years since quitting: 41.7   Smokeless tobacco: Never  Vaping Use   Vaping status: Never Used  Substance and Sexual Activity   Alcohol use: No   Drug use: No   Sexual activity: Not Currently  Other Topics Concern   Not on file  Social History Narrative   Not on file   Social Determinants of Health   Financial Resource Strain: Low Risk  (02/13/2023)   Overall Financial Resource Strain (CARDIA)    Difficulty of Paying Living Expenses: Not hard at all  Food Insecurity: No Food Insecurity (02/13/2023)   Hunger Vital Sign    Worried About Running Out of Food in the Last Year: Never true    Ran Out of Food in the Last Year: Never true  Transportation Needs: No Transportation Needs (02/13/2023)   PRAPARE - Administrator, Civil Service (Medical): No    Lack of Transportation (Non-Medical): No  Physical Activity: Inactive (02/13/2023)   Exercise Vital Sign    Days of Exercise per Week: 0 days    Minutes of Exercise per Session: 0 min  Stress: No Stress Concern Present (02/13/2023)   [employer] of Occupational Health - Occupational Stress Questionnaire    Feeling of Stress : Not at all  Social Connections: Moderately Isolated (02/13/2023)   Social Connection and Isolation Panel [NHANES]    Frequency of Communication with Friends and Family: More than three times a week    Frequency of Social Gatherings with Friends and Family: More than three times a week    Attends Religious Services: More than 4 times per year    Active Member of [employer] or  Organizations: No    Attends Banker Meetings: Never    Marital Status: Widowed    Objective:  BP 136/70   Pulse 60   Temp (!) 97.2 F (36.2 C)   Resp 14   Ht 5\' 2"  (1.575 m)   Wt 194 lb 6.4 oz (88.2 kg)   BMI 35.56 kg/m      03/04/2023   11:42 AM 03/04/2023   11:37 AM 02/13/2023   10:44 AM  BP/Weight  Systolic BP 136 144 130  Diastolic BP 70 60 72  Wt. (Lbs)  194.4 190  BMI  35.56 kg/m2 35.32 kg/m2    Physical Exam  Diabetic Foot Exam - Simple   No data filed      Lab Results  Component Value Date   WBC 7.3 03/04/2023   HGB 10.2 (L) 03/04/2023   HCT 33.1 (L) 03/04/2023   PLT 306 03/04/2023   GLUCOSE 80 03/04/2023   CHOL 219 (H) 03/04/2023   TRIG 60 03/04/2023   HDL 75 03/04/2023   LDLCALC 134 (H) 03/04/2023   ALT 7 03/04/2023   AST 16 03/04/2023   NA 143 03/04/2023   K 4.2 03/04/2023   CL 102 03/04/2023   CREATININE 0.96 03/04/2023   BUN 16 03/04/2023   CO2 27 03/04/2023   TSH 0.341 (L) 11/26/2022   INR 1.0 03/21/2020      Assessment & Plan:    Mixed hyperlipidemia Assessment & Plan: Recommend continue to work on eating healthy diet and exercise.  Check lipids  Orders: -     Lipid panel  Essential hypertension Assessment & Plan: The current medical regimen is effective;  continue present plan and medications. triamterene/hct 37.5 mg/25 mg once daily.  Check labs.    Orders: -     CBC with Differential/Platelet -     Comprehensive metabolic panel  Encounter for immunization -     Pneumococcal conjugate vaccine 20-valent  Stage 3b chronic kidney disease (HCC) Assessment & Plan: Improved   Other iron deficiency anemia Assessment & Plan: Check labs Management per specialist.     Peripheral vascular disease (HCC) Assessment & Plan: Refuses statins. Continue aspirin 81 mg daily.    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 Comorbidities: hyperlipidemia, hypertension.      No orders of the defined types were placed in this encounter.   Orders Placed This Encounter  Procedures   Pneumococcal conjugate vaccine 20-valent   CBC with Differential/Platelet   Comprehensive metabolic panel   Lipid panel     Follow-up: Return in about 4 months (around 07/04/2023) for chronic follow up.   I,Marla I Leal-Borjas,acting as a scribe for Blane Ohara, MD.,have documented all relevant documentation on the behalf of Blane Ohara, MD,as directed by  Blane Ohara, MD while in the presence of Blane Ohara, MD.   An After Visit Summary was printed and given to the patient.  I attest that I have reviewed this visit and agree with the plan scribed by my staff.   Blane Ohara, MD [facility] [phone]

## 2023-03-04 ENCOUNTER — Ambulatory Visit (INDEPENDENT_AMBULATORY_CARE_PROVIDER_SITE_OTHER): Payer: Medicare Other | Admitting: Family Medicine

## 2023-03-04 VITALS — BP 136/70 | HR 60 | Temp 97.2°F | Resp 14 | Ht 62.0 in | Wt 194.4 lb

## 2023-03-04 DIAGNOSIS — E782 Mixed hyperlipidemia: Secondary | ICD-10-CM | POA: Diagnosis not present

## 2023-03-04 DIAGNOSIS — D508 Other iron deficiency anemias: Secondary | ICD-10-CM

## 2023-03-04 DIAGNOSIS — N1832 Chronic kidney disease, stage 3b: Secondary | ICD-10-CM | POA: Diagnosis not present

## 2023-03-04 DIAGNOSIS — I10 Essential (primary) hypertension: Secondary | ICD-10-CM

## 2023-03-04 DIAGNOSIS — I739 Peripheral vascular disease, unspecified: Secondary | ICD-10-CM | POA: Diagnosis not present

## 2023-03-04 DIAGNOSIS — Z23 Encounter for immunization: Secondary | ICD-10-CM | POA: Diagnosis not present

## 2023-03-05 LAB — COMPREHENSIVE METABOLIC PANEL
ALT: 7 IU/L (ref 0–32)
AST: 16 IU/L (ref 0–40)
Albumin: 3.9 g/dL (ref 3.7–4.7)
Alkaline Phosphatase: 66 IU/L (ref 44–121)
BUN/Creatinine Ratio: 17 (ref 12–28)
BUN: 16 mg/dL (ref 8–27)
Bilirubin Total: 0.4 mg/dL (ref 0.0–1.2)
CO2: 27 mmol/L (ref 20–29)
Calcium: 9.2 mg/dL (ref 8.7–10.3)
Chloride: 102 mmol/L (ref 96–106)
Creatinine, Ser: 0.96 mg/dL (ref 0.57–1.00)
Globulin, Total: 2.7 g/dL (ref 1.5–4.5)
Glucose: 80 mg/dL (ref 70–99)
Potassium: 4.2 mmol/L (ref 3.5–5.2)
Sodium: 143 mmol/L (ref 134–144)
Total Protein: 6.6 g/dL (ref 6.0–8.5)
eGFR: 59 mL/min/{1.73_m2} — ABNORMAL LOW (ref >59)

## 2023-03-05 LAB — LIPID PANEL
Chol/HDL Ratio: 2.9 ratio (ref 0.0–4.4)
Cholesterol, Total: 219 mg/dL — ABNORMAL HIGH (ref 100–199)
HDL: 75 mg/dL (ref >39)
LDL Chol Calc (NIH): 134 mg/dL — ABNORMAL HIGH (ref 0–99)
Triglycerides: 60 mg/dL (ref 0–149)
VLDL Cholesterol Cal: 10 mg/dL (ref 5–40)

## 2023-03-05 LAB — CBC WITH DIFFERENTIAL/PLATELET
Basophils Absolute: 0 10*3/uL (ref 0.0–0.2)
Basos: 0 %
EOS (ABSOLUTE): 0.2 10*3/uL (ref 0.0–0.4)
Eos: 2 %
Hematocrit: 33.1 % — ABNORMAL LOW (ref 34.0–46.6)
Hemoglobin: 10.2 g/dL — ABNORMAL LOW (ref 11.1–15.9)
Immature Grans (Abs): 0 10*3/uL (ref 0.0–0.1)
Immature Granulocytes: 0 %
Lymphocytes Absolute: 2 10*3/uL (ref 0.7–3.1)
Lymphs: 27 %
MCH: 28.3 pg (ref 26.6–33.0)
MCHC: 30.8 g/dL — ABNORMAL LOW (ref 31.5–35.7)
MCV: 92 fL (ref 79–97)
Monocytes Absolute: 0.6 10*3/uL (ref 0.1–0.9)
Monocytes: 9 %
Neutrophils Absolute: 4.5 10*3/uL (ref 1.4–7.0)
Neutrophils: 62 %
Platelets: 306 10*3/uL (ref 150–450)
RBC: 3.6 x10E6/uL — ABNORMAL LOW (ref 3.77–5.28)
RDW: 11.9 % (ref 11.7–15.4)
WBC: 7.3 10*3/uL (ref 3.4–10.8)

## 2023-03-06 DIAGNOSIS — M25561 Pain in right knee: Secondary | ICD-10-CM | POA: Diagnosis not present

## 2023-03-06 DIAGNOSIS — M170 Bilateral primary osteoarthritis of knee: Secondary | ICD-10-CM | POA: Diagnosis not present

## 2023-03-06 DIAGNOSIS — M25562 Pain in left knee: Secondary | ICD-10-CM | POA: Diagnosis not present

## 2023-03-06 DIAGNOSIS — R262 Difficulty in walking, not elsewhere classified: Secondary | ICD-10-CM | POA: Diagnosis not present

## 2023-03-08 NOTE — Assessment & Plan Note (Signed)
Recommend continue to work on eating healthy diet and exercise. Check lipids.  

## 2023-03-08 NOTE — Assessment & Plan Note (Signed)
The current medical regimen is effective;  continue present plan and medications. triamterene/hct 37.5 mg/25 mg once daily.  Check labs.

## 2023-03-09 ENCOUNTER — Encounter: Payer: Self-pay | Admitting: Family Medicine

## 2023-03-09 DIAGNOSIS — Z23 Encounter for immunization: Secondary | ICD-10-CM | POA: Insufficient documentation

## 2023-03-09 NOTE — Assessment & Plan Note (Signed)
Check labs. Management per specialist.

## 2023-03-09 NOTE — Assessment & Plan Note (Signed)
Refuses statins. Continue aspirin 81 mg daily.

## 2023-03-09 NOTE — Assessment & Plan Note (Signed)
Recommend continue to work on eating healthy diet and exercise. Comorbidities: hyperlipidemia, hypertension. 

## 2023-03-09 NOTE — Assessment & Plan Note (Signed)
Improved

## 2023-03-13 DIAGNOSIS — M25562 Pain in left knee: Secondary | ICD-10-CM | POA: Diagnosis not present

## 2023-03-13 DIAGNOSIS — M25561 Pain in right knee: Secondary | ICD-10-CM | POA: Diagnosis not present

## 2023-03-13 DIAGNOSIS — R262 Difficulty in walking, not elsewhere classified: Secondary | ICD-10-CM | POA: Diagnosis not present

## 2023-03-13 DIAGNOSIS — M170 Bilateral primary osteoarthritis of knee: Secondary | ICD-10-CM | POA: Diagnosis not present

## 2023-03-27 ENCOUNTER — Ambulatory Visit: Payer: Medicare Other | Admitting: Oncology

## 2023-03-27 ENCOUNTER — Other Ambulatory Visit: Payer: Medicare Other

## 2023-03-27 ENCOUNTER — Inpatient Hospital Stay (INDEPENDENT_AMBULATORY_CARE_PROVIDER_SITE_OTHER): Payer: Medicare Other | Admitting: Oncology

## 2023-03-27 ENCOUNTER — Other Ambulatory Visit: Payer: Self-pay | Admitting: Oncology

## 2023-03-27 ENCOUNTER — Inpatient Hospital Stay: Payer: Medicare Other | Attending: Oncology

## 2023-03-27 VITALS — BP 190/79 | HR 62 | Temp 98.3°F | Resp 16 | Ht 61.5 in | Wt 193.0 lb

## 2023-03-27 DIAGNOSIS — D509 Iron deficiency anemia, unspecified: Secondary | ICD-10-CM | POA: Insufficient documentation

## 2023-03-27 DIAGNOSIS — D508 Other iron deficiency anemias: Secondary | ICD-10-CM

## 2023-03-27 LAB — CBC AND DIFFERENTIAL
HCT: 32 — AB (ref 36–46)
Hemoglobin: 10.5 — AB (ref 12.0–16.0)
Neutrophils Absolute: 4.21
Platelets: 310 10*3/uL (ref 150–400)
WBC: 6.9

## 2023-03-27 LAB — CMP (CANCER CENTER ONLY)
ALT: 12 U/L (ref 0–44)
AST: 15 U/L (ref 15–41)
Albumin: 4.2 g/dL (ref 3.5–5.0)
Alkaline Phosphatase: 54 U/L (ref 38–126)
Anion gap: 10 (ref 5–15)
BUN: 24 mg/dL — ABNORMAL HIGH (ref 8–23)
CO2: 28 mmol/L (ref 22–32)
Calcium: 9.2 mg/dL (ref 8.9–10.3)
Chloride: 102 mmol/L (ref 98–111)
Creatinine: 1.11 mg/dL — ABNORMAL HIGH (ref 0.44–1.00)
GFR, Estimated: 50 mL/min — ABNORMAL LOW (ref >60)
Glucose, Bld: 90 mg/dL (ref 70–99)
Potassium: 3.3 mmol/L — ABNORMAL LOW (ref 3.5–5.1)
Sodium: 140 mmol/L (ref 135–145)
Total Bilirubin: 0.5 mg/dL (ref 0.3–1.2)
Total Protein: 7.3 g/dL (ref 6.5–8.1)

## 2023-03-27 LAB — IRON AND TIBC
Iron: 72 ug/dL (ref 28–170)
Saturation Ratios: 20 % (ref 10.4–31.8)
TIBC: 353 ug/dL (ref 250–450)
UIBC: 281 ug/dL

## 2023-03-27 LAB — FERRITIN: Ferritin: 132 ng/mL (ref 11–307)

## 2023-03-27 LAB — CBC: RBC: 3.67 — AB (ref 3.87–5.11)

## 2023-03-27 NOTE — Progress Notes (Signed)
[facility]  [address] [place],  [place]  [address] [phone]  Clinic Day:  03/27/2023  Referring physician: Blane Ohara, MD  HISTORY OF PRESENT ILLNESS:  The patient is a 82 y.o. 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  She comes in today to reassess her iron and hemoglobin levels after receiving another course of IV iron in June 2024.  Since her last visit, she has been doing well.  Of note, her colonoscopy in July 2024 showed a rectal polyp and findings that suggested a recent diverticular bleeding.    PHYSICAL EXAM:  Blood pressure (!) 190/79, pulse 62, temperature 98.3 F (36.8 C), resp. rate 16, height 5' 1.5" (1.562 m), weight 193 lb (87.5 kg), SpO2 98%. Wt Readings from Last 3 Encounters:  03/27/23 193 lb (87.5 kg)  03/04/23 194 lb 6.4 oz (88.2 kg)  02/13/23 190 lb (86.2 kg)   Body mass index is 35.88 kg/m. Performance status (ECOG): 1 - Symptomatic but completely ambulatory Physical Exam Constitutional:      Appearance: Normal appearance. She is not ill-appearing.  HENT:     Mouth/Throat:     Mouth: Mucous membranes are moist.     Pharynx: Oropharynx is clear. No oropharyngeal exudate or posterior oropharyngeal erythema.  Cardiovascular:     Rate and Rhythm: Normal rate and regular rhythm.     Heart sounds: No murmur heard.    No friction rub. No gallop.  Pulmonary:     Effort: Pulmonary effort is normal. No respiratory distress.     Breath sounds: Normal breath sounds. No wheezing, rhonchi or rales.  Abdominal:     General: Bowel sounds are normal. There is no distension.     Palpations: Abdomen is soft. There is no mass.     Tenderness: There is no abdominal tenderness.  Musculoskeletal:        General: No swelling.     Right lower leg: No edema.     Left lower leg: No edema.  Lymphadenopathy:     Cervical: No cervical adenopathy.     Upper  Body:     Right upper body: No supraclavicular or axillary adenopathy.     Left upper body: No supraclavicular or axillary adenopathy.     Lower Body: No right inguinal adenopathy. No left inguinal adenopathy.  Skin:    General: Skin is warm.     Coloration: Skin is not jaundiced.     Findings: No lesion or rash.  Neurological:     General: No focal deficit present.     Mental Status: She is alert and oriented to person, place, and time. Mental status is at baseline.  Psychiatric:        Mood and Affect: Mood normal.        Behavior: Behavior normal.        Thought Content: Thought content normal.   LABS:    Latest Reference Range & Units 03/27/23 00:00 03/27/23 16:00  Sodium 135 - 145 mmol/L  140  Potassium 3.5 - 5.1 mmol/L  3.3 (L)  Chloride 98 - 111 mmol/L  102  CO2 22 - 32 mmol/L  28  Glucose 70 - 99 mg/dL  90  BUN 8 - 23 mg/dL  24 (H)  Creatinine 6.60 - 1.00 mg/dL  6.30 (H)  Calcium 8.9 - 10.3 mg/dL  9.2  Anion gap 5 - 15   10  Alkaline Phosphatase 38 - 126 U/L  54  Albumin 3.5 - 5.0 g/dL  4.2  AST 15 - 41 U/L  15  ALT 0 - 44 U/L  12  Total Protein 6.5 - 8.1 g/dL  7.3  Total Bilirubin 0.3 - 1.2 mg/dL  0.5  GFR, Est Non African American >60 mL/min  50 (L)  Iron 28 - 170 ug/dL  72  UIBC ug/dL  161  TIBC 096 - 045 ug/dL  409  Saturation Ratios 10.4 - 31.8 %  20  Ferritin 11 - 307 ng/mL  132  CBC W DIFFERENTIAL ([facility] SCANNED REPORT)   Rpt (IP)  WBC  6.9 (E)   RBC 3.87 - 5.11  3.67 ! (E)   Hemoglobin 12.0 - 16.0  10.5 ! (E)   HCT 36 - 46  32 ! (E)   Platelets 150 - 400 K/uL 310 (E)   NEUT#  4.21 (E)   (L): Data is abnormally low (H): Data is abnormally high  ASSESSMENT & PLAN:  Assessment/Plan:  An 82 y.o. female with iron deficiency anemia.  Her hemoglobin of 10.6 is much better after her recent IV iron infusion.  Clinically, she appears to be doing much better.  I will see her back in 4 months for repeat clinical assessment. The patient understands all the  plans discussed today and is in agreement with them.    Deshunda Thackston Kirby Funk, MD

## 2023-03-28 ENCOUNTER — Telehealth: Payer: Self-pay | Admitting: Oncology

## 2023-03-28 NOTE — Telephone Encounter (Signed)
Contacted pt to schedule an appt. Unable to reach via phone, voicemail was left.    Scheduling Message Entered by Rennis Harding A on 03/27/2023 at  5:35 PM Priority: Routine <No visit type provided>  Department: [facility]  Provider:  Scheduling Notes:  Labs/appt 07-28-23

## 2023-04-02 ENCOUNTER — Encounter: Payer: Self-pay | Admitting: Oncology

## 2023-04-03 DIAGNOSIS — M170 Bilateral primary osteoarthritis of knee: Secondary | ICD-10-CM | POA: Diagnosis not present

## 2023-04-03 DIAGNOSIS — R262 Difficulty in walking, not elsewhere classified: Secondary | ICD-10-CM | POA: Diagnosis not present

## 2023-04-03 DIAGNOSIS — M25561 Pain in right knee: Secondary | ICD-10-CM | POA: Diagnosis not present

## 2023-04-03 DIAGNOSIS — M25562 Pain in left knee: Secondary | ICD-10-CM | POA: Diagnosis not present

## 2023-04-10 ENCOUNTER — Telehealth: Payer: Self-pay

## 2023-04-10 NOTE — Telephone Encounter (Signed)
  ASSESSMENT & PLAN:  Assessment/Plan:  An 82 y.o. female with iron deficiency anemia.  Her hemoglobin of 10.6 is much better after her recent IV iron infusion.  Clinically, she appears to be doing much better.  I will see her back in 4 months for repeat clinical assessment. The patient understands all the plans discussed today and is in agreement with them.     Weston Settle, MD   Latest Reference Range & Units 03/27/23 16:00  Iron 28 - 170 ug/dL 72  UIBC ug/dL 102  TIBC 725 - 366 ug/dL 440  Saturation Ratios 10.4 - 31.8 % 20  Ferritin 11 - 307 ng/mL 132

## 2023-05-04 DIAGNOSIS — M170 Bilateral primary osteoarthritis of knee: Secondary | ICD-10-CM | POA: Diagnosis not present

## 2023-05-07 DIAGNOSIS — I129 Hypertensive chronic kidney disease with stage 1 through stage 4 chronic kidney disease, or unspecified chronic kidney disease: Secondary | ICD-10-CM | POA: Diagnosis not present

## 2023-05-07 DIAGNOSIS — E669 Obesity, unspecified: Secondary | ICD-10-CM | POA: Diagnosis not present

## 2023-05-07 DIAGNOSIS — Z6835 Body mass index (BMI) 35.0-35.9, adult: Secondary | ICD-10-CM | POA: Diagnosis not present

## 2023-05-07 DIAGNOSIS — N1831 Chronic kidney disease, stage 3a: Secondary | ICD-10-CM | POA: Diagnosis not present

## 2023-05-13 DIAGNOSIS — N1831 Chronic kidney disease, stage 3a: Secondary | ICD-10-CM | POA: Diagnosis not present

## 2023-05-13 DIAGNOSIS — N189 Chronic kidney disease, unspecified: Secondary | ICD-10-CM | POA: Diagnosis not present

## 2023-06-09 DIAGNOSIS — M170 Bilateral primary osteoarthritis of knee: Secondary | ICD-10-CM | POA: Diagnosis not present

## 2023-06-18 ENCOUNTER — Other Ambulatory Visit: Payer: Self-pay | Admitting: Oncology

## 2023-07-02 ENCOUNTER — Ambulatory Visit: Payer: Medicare Other | Admitting: Family Medicine

## 2023-07-02 NOTE — Telephone Encounter (Signed)
Appointment has been rescheduled. Patient was unable to do a virtual visit.

## 2023-07-02 NOTE — Telephone Encounter (Signed)
-----   Message from Blane Ohara sent at 07/01/2023  9:37 PM EST ----- Regarding: CALL PT IN AM. Patient does not have my chart. Please call to see if we can do a virtual visit or reschedule.   Thank you. Dr. Sedalia Muta

## 2023-07-06 NOTE — Progress Notes (Unsigned)
Subjective:  Patient ID: Ashley Ritter, female    DOB: [birth year]  Age: 83 y.o. MRN: [id number]  Chief Complaint  Patient presents with   Medical Management of Chronic Issues    HPI History of Present Illness The patient, with a history of hypertension, presents for a routine follow-up. She has been actively managing her blood pressure, which today is 132/84, a significant improvement from the previous reading of 190/79. She attributes this improvement to adherence to her prescribed medications, including a diuretic and a blood pressure pill, as well as dietary changes such as reduced salt intake and the addition of garlic. She also takes a baby aspirin, iron, a multivitamin, and vitamin D. She has recently added calcium to her regimen, but has experienced constipation as a side effect. She manages this with beet juice.  She acknowledges a need to incorporate more exercise into her routine, but cites the cold weather as a barrier. She does perform leg exercises while seated.   Hyperlipidemia: Current medications: OTC garlic.   Anemia: sees Dr. Melvyn Neth. Iron deficiency.  Tandem iron capsules 1 daily.   Hypertension: Complications: CKD 3B Current medications: triamterene/hct 37.5 mg/25 mg once daily.   Declined DEXA at this time.     07/07/2023    4:02 PM 02/13/2023   11:37 AM 11/26/2022   11:43 AM 07/26/2022   10:46 AM 03/07/2022   11:13 AM  Depression screen PHQ 2/9  Decreased Interest 0 0 0 0 0  Down, Depressed, Hopeless 0 0 0 0 0  PHQ - 2 Score 0 0 0 0 0        07/07/2023    4:02 PM  Fall Risk   Falls in the past year? 0  Number falls in past yr: 0  Injury with Fall? 0  Risk for fall due to : Impaired balance/gait  Follow up Falls evaluation completed    Patient Care Team: Blane Ohara, MD as PCP - General (Internal Medicine) Georgeanna Lea, MD as PCP - Cardiology (Cardiology) Weston Settle, MD as Consulting Physician (Hematology and Oncology)    Review of Systems  Constitutional:  Negative for chills, fatigue and fever.  HENT:  Negative for congestion, ear pain, rhinorrhea and sore throat.   Respiratory:  Negative for cough and shortness of breath.   Cardiovascular:  Negative for chest pain.  Gastrointestinal:  Negative for abdominal pain, constipation, diarrhea, nausea and vomiting.  Genitourinary:  Negative for dysuria and urgency.  Musculoskeletal:  Negative for back pain and myalgias.  Neurological:  Negative for dizziness, weakness, light-headedness and headaches.  Psychiatric/Behavioral:  Negative for dysphoric mood. The patient is not nervous/anxious.     Current Outpatient Medications on File Prior to Visit  Medication Sig Dispense Refill   aspirin EC 81 MG tablet Take 81 mg by mouth daily. Swallow whole.     ferrous fumarate-iron polysaccharide complex (TANDEM) 162-115.2 MG CAPS capsule Take 1 capsule by mouth daily.     Garlic (GARLIQUE) 400 MG TBEC Take 400 mg by mouth daily.     Multiple Vitamin (MULTIVITAMIN ADULT PO) Take 1 tablet by mouth daily.     triamterene-hydrochlorothiazide (MAXZIDE-25) 37.5-25 MG tablet Take 1 tablet by mouth daily. 90 tablet 3   VITAMIN D PO Take 1 tablet by mouth daily. Unknown strength     No current facility-administered medications on file prior to visit.   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   Syncope 11/03/2016   Vertigo    Past Surgical History:   Procedure Laterality Date   ABDOMINAL HYSTERECTOMY     GASTRIC RESTRICTION SURGERY     SHOULDER SURGERY     TONSILLECTOMY     TOTAL HIP ARTHROPLASTY Right 04/23/2017   Procedure: RIGHT TOTAL HIP ARTHROPLASTY ANTERIOR APPROACH;  Surgeon: Ollen Gross, MD;  Location: [facility];  Service: Orthopedics;  Laterality: Right;   TOTAL HIP ARTHROPLASTY Left 03/29/2020   Procedure: TOTAL HIP ARTHROPLASTY ANTERIOR APPROACH;  Surgeon: Ollen Gross, MD;  Location: [facility];  Service: Orthopedics;  Laterality: Left;    TUBAL LIGATION      Family History  Problem Relation Age of Onset   Diabetes Mother    Heart attack Father    Heart disease Father    Hyperlipidemia Brother    Hypertension Brother    Dementia Brother    Social History   Socioeconomic History   Marital status: Widowed    Spouse name: Not on file   Number of children: Not on file   Years of education: Not on file   Highest education level: Not on file  Occupational History   Occupation: Retired Producer, television/film/video  Tobacco Use   Smoking status: Former    Current packs/day: 0.00    Types: Cigarettes    Quit date: 1983    Years since quitting: 42.1   Smokeless tobacco: Never  Vaping Use   Vaping status: Never Used  Substance and Sexual Activity   Alcohol use: No   Drug use: No   Sexual activity: Not Currently  Other Topics Concern   Not on file  Social History Narrative   Not on file   Social Drivers of Health   Financial Resource Strain: Low Risk  (02/13/2023)   Overall Financial Resource Strain (CARDIA)    Difficulty of Paying Living Expenses: Not hard at all  Food Insecurity: No Food Insecurity (02/13/2023)   Hunger Vital Sign    Worried About Running Out of Food in the Last Year: Never true    Ran Out of Food in the Last Year: Never true  Transportation Needs: No Transportation Needs (02/13/2023)   PRAPARE - Administrator, Civil Service (Medical): No    Lack of Transportation (Non-Medical): No   Physical Activity: Inactive (02/13/2023)   Exercise Vital Sign    Days of Exercise per Week: 0 days    Minutes of Exercise per Session: 0 min  Stress: No Stress Concern Present (02/13/2023)   [employer] of Occupational Health - Occupational Stress Questionnaire    Feeling of Stress : Not at all  Social Connections: Moderately Isolated (02/13/2023)   Social Connection and Isolation Panel [NHANES]    Frequency of Communication with Friends and Family: More than three times a week    Frequency of Social Gatherings with Friends and Family: More than three times a week    Attends Religious Services: More than 4 times per year  Active Member of Clubs or Organizations: No    Attends Banker Meetings: Never    Marital Status: Widowed    Objective:  BP 132/84   Pulse 86   Temp 97.6 F (36.4 C)   Ht 5' 1.5" (1.562 m)   Wt 198 lb (89.8 kg)   SpO2 98%   BMI 36.81 kg/m      07/07/2023    4:00 PM 03/27/2023    4:30 PM 03/27/2023    4:15 PM  BP/Weight  Systolic BP 132 190 176  Diastolic BP 84 79 77  Wt. (Lbs) 198  193  BMI 36.81 kg/m2  35.88 kg/m2    Physical Exam Vitals reviewed.  Constitutional:      Appearance: Normal appearance. She is normal weight.  Neck:     Vascular: No carotid bruit.  Cardiovascular:     Rate and Rhythm: Normal rate and regular rhythm.     Heart sounds: Normal heart sounds.  Pulmonary:     Effort: Pulmonary effort is normal. No respiratory distress.     Breath sounds: Normal breath sounds.  Abdominal:     General: Abdomen is flat. Bowel sounds are normal.     Palpations: Abdomen is soft.     Tenderness: There is no abdominal tenderness.  Neurological:     Mental Status: She is alert and oriented to person, place, and time.  Psychiatric:        Mood and Affect: Mood normal.        Behavior: Behavior normal.     Diabetic Foot Exam - Simple   No data filed      Lab Results  Component Value Date   WBC 6.6 07/07/2023   HGB  10.5 (L) 07/07/2023   HCT 33.6 (L) 07/07/2023   PLT 290 07/07/2023   GLUCOSE 86 07/07/2023   CHOL 187 07/07/2023   TRIG 63 07/07/2023   HDL 63 07/07/2023   LDLCALC 112 (H) 07/07/2023   ALT 8 07/07/2023   AST 14 07/07/2023   NA 141 07/07/2023   K 4.1 07/07/2023   CL 100 07/07/2023   CREATININE 1.02 (H) 07/07/2023   BUN 17 07/07/2023   CO2 27 07/07/2023   TSH 0.683 07/07/2023   INR 1.0 03/21/2020      Assessment & Plan:    Essential hypertension Assessment & Plan: Improved control with current regimen of Triamterene-Hydrochlorothiazide. No reported side effects. Patient has made dietary modifications including reduced salt intake. -Continue current medication regimen. -Encourage continued dietary modifications and increased physical activity.  Orders: -     CBC with Differential/Platelet -     Comprehensive metabolic panel  Mixed hyperlipidemia Assessment & Plan: Recommend continue to work on eating healthy diet and exercise.  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 Comorbidities: hyperlipidemia, hypertension.   Severe obesity with body mass index (BMI) of 36.0 to 36.9 with serious comorbidity [facility]) Assessment & Plan: Recommend continue to work on eating healthy diet and exercise. Comorbidities: hyperlipidemia, hypertension.   Other iron deficiency anemia Assessment & Plan: Check labs Management per specialist.        No orders of the defined types were placed in this  encounter.   Orders Placed This Encounter  Procedures   CBC with Differential/Platelet   Comprehensive metabolic panel   Lipid panel   TSH     Follow-up: Return in about 3 months (around 10/05/2023) for chronic follow  up.   I,Marla I Leal-Borjas,acting as a scribe for Blane Ohara, MD.,have documented all relevant documentation on the behalf of Blane Ohara, MD,as directed by  Blane Ohara, MD while in the presence of Blane Ohara, MD.   An After Visit Summary was printed and given to the patient.  Blane Ohara, MD [facility] [phone]

## 2023-07-07 ENCOUNTER — Encounter: Payer: Self-pay | Admitting: Family Medicine

## 2023-07-07 ENCOUNTER — Ambulatory Visit: Payer: Medicare Other | Admitting: Family Medicine

## 2023-07-07 VITALS — BP 132/84 | HR 86 | Temp 97.6°F | Ht 61.5 in | Wt 198.0 lb

## 2023-07-07 DIAGNOSIS — E66812 Obesity, class 2: Secondary | ICD-10-CM | POA: Diagnosis not present

## 2023-07-07 DIAGNOSIS — N1832 Chronic kidney disease, stage 3b: Secondary | ICD-10-CM

## 2023-07-07 DIAGNOSIS — D508 Other iron deficiency anemias: Secondary | ICD-10-CM

## 2023-07-07 DIAGNOSIS — E782 Mixed hyperlipidemia: Secondary | ICD-10-CM | POA: Diagnosis not present

## 2023-07-07 DIAGNOSIS — I10 Essential (primary) hypertension: Secondary | ICD-10-CM | POA: Diagnosis not present

## 2023-07-07 DIAGNOSIS — Z6836 Body mass index (BMI) 36.0-36.9, adult: Secondary | ICD-10-CM

## 2023-07-08 LAB — COMPREHENSIVE METABOLIC PANEL
ALT: 8 [IU]/L (ref 0–32)
AST: 14 [IU]/L (ref 0–40)
Albumin: 4.1 g/dL (ref 3.7–4.7)
Alkaline Phosphatase: 62 [IU]/L (ref 44–121)
BUN/Creatinine Ratio: 17 (ref 12–28)
BUN: 17 mg/dL (ref 8–27)
Bilirubin Total: 0.5 mg/dL (ref 0.0–1.2)
CO2: 27 mmol/L (ref 20–29)
Calcium: 9.6 mg/dL (ref 8.7–10.3)
Chloride: 100 mmol/L (ref 96–106)
Creatinine, Ser: 1.02 mg/dL — ABNORMAL HIGH (ref 0.57–1.00)
Globulin, Total: 2.7 g/dL (ref 1.5–4.5)
Glucose: 86 mg/dL (ref 70–99)
Potassium: 4.1 mmol/L (ref 3.5–5.2)
Sodium: 141 mmol/L (ref 134–144)
Total Protein: 6.8 g/dL (ref 6.0–8.5)
eGFR: 55 mL/min/{1.73_m2} — ABNORMAL LOW (ref >59)

## 2023-07-08 LAB — CBC WITH DIFFERENTIAL/PLATELET
Basophils Absolute: 0.1 10*3/uL (ref 0.0–0.2)
Basos: 1 %
EOS (ABSOLUTE): 0.2 10*3/uL (ref 0.0–0.4)
Eos: 3 %
Hematocrit: 33.6 % — ABNORMAL LOW (ref 34.0–46.6)
Hemoglobin: 10.5 g/dL — ABNORMAL LOW (ref 11.1–15.9)
Immature Grans (Abs): 0 10*3/uL (ref 0.0–0.1)
Immature Granulocytes: 0 %
Lymphocytes Absolute: 2.4 10*3/uL (ref 0.7–3.1)
Lymphs: 37 %
MCH: 27.9 pg (ref 26.6–33.0)
MCHC: 31.3 g/dL — ABNORMAL LOW (ref 31.5–35.7)
MCV: 89 fL (ref 79–97)
Monocytes Absolute: 0.5 10*3/uL (ref 0.1–0.9)
Monocytes: 7 %
Neutrophils Absolute: 3.4 10*3/uL (ref 1.4–7.0)
Neutrophils: 52 %
Platelets: 290 10*3/uL (ref 150–450)
RBC: 3.77 x10E6/uL (ref 3.77–5.28)
RDW: 11.8 % (ref 11.7–15.4)
WBC: 6.6 10*3/uL (ref 3.4–10.8)

## 2023-07-08 LAB — LIPID PANEL
Chol/HDL Ratio: 3 {ratio} (ref 0.0–4.4)
Cholesterol, Total: 187 mg/dL (ref 100–199)
HDL: 63 mg/dL (ref >39)
LDL Chol Calc (NIH): 112 mg/dL — ABNORMAL HIGH (ref 0–99)
Triglycerides: 63 mg/dL (ref 0–149)
VLDL Cholesterol Cal: 12 mg/dL (ref 5–40)

## 2023-07-08 LAB — TSH: TSH: 0.683 u[IU]/mL (ref 0.450–4.500)

## 2023-07-12 NOTE — Assessment & Plan Note (Signed)
Recommend continue to work on eating healthy diet and exercise. Check lipids.  

## 2023-07-12 NOTE — Assessment & Plan Note (Signed)
Improved control with current regimen of Triamterene-Hydrochlorothiazide. No reported side effects. Patient has made dietary modifications including reduced salt intake. -Continue current medication regimen. -Encourage continued dietary modifications and increased physical activity.

## 2023-07-12 NOTE — Assessment & Plan Note (Signed)
 Stable

## 2023-07-13 NOTE — Assessment & Plan Note (Signed)
Check labs. Management per specialist.

## 2023-07-13 NOTE — Assessment & Plan Note (Signed)
Recommend continue to work on eating healthy diet and exercise. Comorbidities: hyperlipidemia, hypertension. 

## 2023-07-28 ENCOUNTER — Inpatient Hospital Stay: Payer: Medicare Other | Attending: Oncology | Admitting: Oncology

## 2023-07-28 ENCOUNTER — Telehealth: Payer: Self-pay | Admitting: Oncology

## 2023-07-28 ENCOUNTER — Other Ambulatory Visit: Payer: Self-pay | Admitting: Oncology

## 2023-07-28 ENCOUNTER — Other Ambulatory Visit: Payer: Self-pay

## 2023-07-28 ENCOUNTER — Inpatient Hospital Stay: Payer: Medicare Other

## 2023-07-28 VITALS — BP 142/75 | HR 74 | Temp 99.1°F | Resp 20 | Ht 61.5 in | Wt 195.3 lb

## 2023-07-28 DIAGNOSIS — D509 Iron deficiency anemia, unspecified: Secondary | ICD-10-CM | POA: Insufficient documentation

## 2023-07-28 DIAGNOSIS — D508 Other iron deficiency anemias: Secondary | ICD-10-CM

## 2023-07-28 DIAGNOSIS — D500 Iron deficiency anemia secondary to blood loss (chronic): Secondary | ICD-10-CM | POA: Diagnosis not present

## 2023-07-28 LAB — CMP (CANCER CENTER ONLY)
ALT: 9 U/L (ref 0–44)
AST: 18 U/L (ref 15–41)
Albumin: 4.2 g/dL (ref 3.5–5.0)
Alkaline Phosphatase: 69 U/L (ref 38–126)
Anion gap: 10 (ref 5–15)
BUN: 17 mg/dL (ref 8–23)
CO2: 30 mmol/L (ref 22–32)
Calcium: 9.7 mg/dL (ref 8.9–10.3)
Chloride: 102 mmol/L (ref 98–111)
Creatinine: 1.14 mg/dL — ABNORMAL HIGH (ref 0.44–1.00)
GFR, Estimated: 48 mL/min — ABNORMAL LOW (ref >60)
Glucose, Bld: 107 mg/dL — ABNORMAL HIGH (ref 70–99)
Potassium: 3.6 mmol/L (ref 3.5–5.1)
Sodium: 142 mmol/L (ref 135–145)
Total Bilirubin: 0.4 mg/dL (ref 0.0–1.2)
Total Protein: 7.1 g/dL (ref 6.5–8.1)

## 2023-07-28 LAB — CBC WITH DIFFERENTIAL (CANCER CENTER ONLY)
Abs Immature Granulocytes: 0.02 10*3/uL (ref 0.00–0.07)
Basophils Absolute: 0 10*3/uL (ref 0.0–0.1)
Basophils Relative: 1 %
Eosinophils Absolute: 0.1 10*3/uL (ref 0.0–0.5)
Eosinophils Relative: 2 %
HCT: 31.5 % — ABNORMAL LOW (ref 36.0–46.0)
Hemoglobin: 10 g/dL — ABNORMAL LOW (ref 12.0–15.0)
Immature Granulocytes: 0 %
Lymphocytes Relative: 34 %
Lymphs Abs: 2.1 10*3/uL (ref 0.7–4.0)
MCH: 28 pg (ref 26.0–34.0)
MCHC: 31.7 g/dL (ref 30.0–36.0)
MCV: 88.2 fL (ref 80.0–100.0)
Monocytes Absolute: 0.5 10*3/uL (ref 0.1–1.0)
Monocytes Relative: 8 %
Neutro Abs: 3.4 10*3/uL (ref 1.7–7.7)
Neutrophils Relative %: 55 %
Platelet Count: 282 10*3/uL (ref 150–400)
RBC: 3.57 MIL/uL — ABNORMAL LOW (ref 3.87–5.11)
RDW: 13.3 % (ref 11.5–15.5)
WBC Count: 6.2 10*3/uL (ref 4.0–10.5)
nRBC: 0 % (ref 0.0–0.2)
nRBC: 0 /100{WBCs}

## 2023-07-28 LAB — IRON AND TIBC
Iron: 87 ug/dL (ref 28–170)
Saturation Ratios: 24 % (ref 10.4–31.8)
TIBC: 358 ug/dL (ref 250–450)
UIBC: 271 ug/dL

## 2023-07-28 LAB — VITAMIN B12: Vitamin B-12: 1367 pg/mL — ABNORMAL HIGH (ref 180–914)

## 2023-07-28 LAB — FOLATE: Folate: 13.8 ng/mL (ref >5.9)

## 2023-07-28 LAB — FERRITIN: Ferritin: 104 ng/mL (ref 11–307)

## 2023-07-28 NOTE — Progress Notes (Signed)
[facility]  [address] [place],  [place]  [address] [phone]  Clinic Day:  07/28/2023  Referring physician: Blane Ohara, MD   HISTORY OF PRESENT ILLNESS:  The patient is an 83 y.o. 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  She comes in today to reassess her iron and hemoglobin levels.  She last received IV iron in June 2024.  Since her last visit, the patient has been doing well.  Of note, her colonoscopy in July 2024 showed a rectal polyp and findings that suggested a recent diverticular bleeding.   However she denies having any overt forms of blood loss since her last visit.  PHYSICAL EXAM:  Blood pressure (!) 142/75, pulse 74, temperature 99.1 F (37.3 C), temperature source Oral, resp. rate 20, height 5' 1.5" (1.562 m), weight 195 lb 4.8 oz (88.6 kg), SpO2 100%. Wt Readings from Last 3 Encounters:  07/28/23 195 lb 4.8 oz (88.6 kg)  07/07/23 198 lb (89.8 kg)  03/27/23 193 lb (87.5 kg)   Body mass index is 36.3 kg/m. Performance status (ECOG): 1 - Symptomatic but completely ambulatory Physical Exam Constitutional:      Appearance: Normal appearance. She is not ill-appearing.     Comments: She is ambulating with a cane   HENT:     Mouth/Throat:     Mouth: Mucous membranes are moist.     Pharynx: Oropharynx is clear. No oropharyngeal exudate or posterior oropharyngeal erythema.  Cardiovascular:     Rate and Rhythm: Normal rate and regular rhythm.     Heart sounds: No murmur heard.    No friction rub. No gallop.  Pulmonary:     Effort: Pulmonary effort is normal. No respiratory distress.     Breath sounds: Normal breath sounds. No wheezing, rhonchi or rales.  Abdominal:     General: Bowel sounds are normal. There is no distension.     Palpations: Abdomen is soft. There is no mass.     Tenderness: There is no abdominal tenderness.  Musculoskeletal:         General: No swelling.     Right lower leg: No edema.     Left lower leg: No edema.  Lymphadenopathy:     Cervical: No cervical adenopathy.     Upper Body:     Right upper body: No supraclavicular or axillary adenopathy.     Left upper body: No supraclavicular or axillary adenopathy.     Lower Body: No right inguinal adenopathy. No left inguinal adenopathy.  Skin:    General: Skin is warm.     Coloration: Skin is not jaundiced.     Findings: No lesion or rash.  Neurological:     General: No focal deficit present.     Mental Status: She is alert and oriented to person, place, and time. Mental status is at baseline.  Psychiatric:        Mood and Affect: Mood normal.        Behavior: Behavior normal.        Thought Content: Thought content normal.    LABS:      Latest Ref Rng & Units 07/28/2023    3:22 PM 07/07/2023    4:43 PM 03/27/2023   12:00 AM  CBC  WBC 4.0 - 10.5 K/uL 6.2  6.6  6.9      Hemoglobin 12.0 - 15.0 g/dL 91.4  78.2  95.6  Hematocrit 36.0 - 46.0 % 31.5  33.6  32      Platelets 150 - 400 K/uL 282  290  310         This result is from an external source.      Latest Ref Rng & Units 07/28/2023    3:22 PM 07/07/2023    4:43 PM 03/27/2023    4:00 PM  CMP  Glucose 70 - 99 mg/dL 562  86  90   BUN 8 - 23 mg/dL 17  17  24    Creatinine 0.44 - 1.00 mg/dL 1.30  8.65  7.84   Sodium 135 - 145 mmol/L 142  141  140   Potassium 3.5 - 5.1 mmol/L 3.6  4.1  3.3   Chloride 98 - 111 mmol/L 102  100  102   CO2 22 - 32 mmol/L 30  27  28    Calcium 8.9 - 10.3 mg/dL 9.7  9.6  9.2   Total Protein 6.5 - 8.1 g/dL 7.1  6.8  7.3   Total Bilirubin 0.0 - 1.2 mg/dL 0.4  0.5  0.5   Alkaline Phos 38 - 126 U/L 69  62  54   AST 15 - 41 U/L 18  14  15    ALT 0 - 44 U/L 9  8  12      Latest Reference Range & Units 07/28/23 15:22  Iron 28 - 170 ug/dL 87  UIBC ug/dL 696  TIBC 295 - 284 ug/dL 132  Saturation Ratios 10.4 - 31.8 % 24  Ferritin 11 - 307 ng/mL 104  Folate >5.9 ng/mL 13.8   Vitamin B12 180 - 914 pg/mL 1,367 (H)  (H): Data is abnormally high  ASSESSMENT & PLAN:  Assessment/Plan:  An 83 y.o. female with iron deficiency anemia.  Her hemoglobin of 10 is lower than what it was at her last visit.  Despite this, her iron studies today look fine.  Clinically, she is doing well.  I will see her back in 4 months for repeat clinical assessment.  The patient understands all the plans discussed today and is in agreement with them.    Sanda Dejoy Kirby Funk, MD

## 2023-07-28 NOTE — Telephone Encounter (Signed)
07/28/23 Next appt scheduled and confirmed with patient.

## 2023-07-28 NOTE — Progress Notes (Signed)
[facility]  [address] [place],  [place]  [address] [phone]  Clinic Day:  03/27/2023  Referring physician: Blane Ohara, MD  HISTORY OF PRESENT ILLNESS:  The patient is a 83 y.o. 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  She comes in today to reassess her iron and hemoglobin levels after receiving another course of IV iron in June 2024.  Since her last visit, she has been doing well.  Of note, her colonoscopy in July 2024 showed a rectal polyp and findings that suggested a recent diverticular bleeding.    PHYSICAL EXAM:  There were no vitals taken for this visit. Wt Readings from Last 3 Encounters:  07/07/23 198 lb (89.8 kg)  03/27/23 193 lb (87.5 kg)  03/04/23 194 lb 6.4 oz (88.2 kg)   There is no height or weight on file to calculate BMI. Performance status (ECOG): 1 - Symptomatic but completely ambulatory Physical Exam Constitutional:      Appearance: Normal appearance. She is not ill-appearing.  HENT:     Mouth/Throat:     Mouth: Mucous membranes are moist.     Pharynx: Oropharynx is clear. No oropharyngeal exudate or posterior oropharyngeal erythema.  Cardiovascular:     Rate and Rhythm: Normal rate and regular rhythm.     Heart sounds: No murmur heard.    No friction rub. No gallop.  Pulmonary:     Effort: Pulmonary effort is normal. No respiratory distress.     Breath sounds: Normal breath sounds. No wheezing, rhonchi or rales.  Abdominal:     General: Bowel sounds are normal. There is no distension.     Palpations: Abdomen is soft. There is no mass.     Tenderness: There is no abdominal tenderness.  Musculoskeletal:        General: No swelling.     Right lower leg: No edema.     Left lower leg: No edema.  Lymphadenopathy:     Cervical: No cervical adenopathy.     Upper Body:     Right upper body: No supraclavicular or axillary adenopathy.      Left upper body: No supraclavicular or axillary adenopathy.     Lower Body: No right inguinal adenopathy. No left inguinal adenopathy.  Skin:    General: Skin is warm.     Coloration: Skin is not jaundiced.     Findings: No lesion or rash.  Neurological:     General: No focal deficit present.     Mental Status: She is alert and oriented to person, place, and time. Mental status is at baseline.  Psychiatric:        Mood and Affect: Mood normal.        Behavior: Behavior normal.        Thought Content: Thought content normal.    LABS:    Latest Reference Range & Units 03/27/23 00:00 03/27/23 16:00  Sodium 135 - 145 mmol/L  140  Potassium 3.5 - 5.1 mmol/L  3.3 (L)  Chloride 98 - 111 mmol/L  102  CO2 22 - 32 mmol/L  28  Glucose 70 - 99 mg/dL  90  BUN 8 - 23 mg/dL  24 (H)  Creatinine 2.13 - 1.00 mg/dL  0.86 (H)  Calcium 8.9 - 10.3 mg/dL  9.2  Anion gap 5 - 15   10  Alkaline Phosphatase 38 - 126 U/L  54  Albumin 3.5 -  5.0 g/dL  4.2  AST 15 - 41 U/L  15  ALT 0 - 44 U/L  12  Total Protein 6.5 - 8.1 g/dL  7.3  Total Bilirubin 0.3 - 1.2 mg/dL  0.5  GFR, Est Non African American >60 mL/min  50 (L)  Iron 28 - 170 ug/dL  72  UIBC ug/dL  161  TIBC 096 - 045 ug/dL  409  Saturation Ratios 10.4 - 31.8 %  20  Ferritin 11 - 307 ng/mL  132  CBC W DIFFERENTIAL ( CC SCANNED REPORT)   Rpt (IP)  WBC  6.9 (E)   RBC 3.87 - 5.11  3.67 ! (E)   Hemoglobin 12.0 - 16.0  10.5 ! (E)   HCT 36 - 46  32 ! (E)   Platelets 150 - 400 K/uL 310 (E)   NEUT#  4.21 (E)   (L): Data is abnormally low (H): Data is abnormally high  ASSESSMENT & PLAN:  Assessment/Plan:  An 83 y.o. female with iron deficiency anemia.  Her hemoglobin of 10.6 is much better after her recent IV iron infusion.  Clinically, she appears to be doing much better.  I will see her back in 4 months for repeat clinical assessment. The patient understands all the plans discussed today and is in agreement with them.    Kareen Jefferys Kirby Funk,  MD

## 2023-08-22 ENCOUNTER — Ambulatory Visit: Payer: Medicare Other | Admitting: Cardiology

## 2023-08-27 ENCOUNTER — Other Ambulatory Visit: Payer: Self-pay | Admitting: Family Medicine

## 2023-08-27 DIAGNOSIS — I10 Essential (primary) hypertension: Secondary | ICD-10-CM

## 2023-09-24 ENCOUNTER — Ambulatory Visit: Payer: Medicare Other | Admitting: Cardiology

## 2023-09-25 ENCOUNTER — Encounter: Payer: Self-pay | Admitting: Cardiology

## 2023-09-25 ENCOUNTER — Ambulatory Visit: Payer: Medicare Other | Attending: Cardiology | Admitting: Cardiology

## 2023-09-25 VITALS — BP 122/82 | HR 77 | Ht 62.0 in | Wt 194.8 lb

## 2023-09-25 DIAGNOSIS — I10 Essential (primary) hypertension: Secondary | ICD-10-CM | POA: Insufficient documentation

## 2023-09-25 DIAGNOSIS — I739 Peripheral vascular disease, unspecified: Secondary | ICD-10-CM | POA: Insufficient documentation

## 2023-09-25 DIAGNOSIS — I6523 Occlusion and stenosis of bilateral carotid arteries: Secondary | ICD-10-CM | POA: Diagnosis present

## 2023-09-25 DIAGNOSIS — E782 Mixed hyperlipidemia: Secondary | ICD-10-CM | POA: Diagnosis not present

## 2023-09-25 NOTE — Patient Instructions (Addendum)
Medication Instructions:  Your physician recommends that you continue on your current medications as directed. Please refer to the Current Medication list given to you today.  *If you need a refill on your cardiac medications before your next appointment, please call your pharmacy*   Lab Work: None Ordered If you have labs (blood work) drawn today and your tests are completely normal, you will receive your results only by: MyChart Message (if you have MyChart) OR A paper copy in the mail If you have any lab test that is abnormal or we need to change your treatment, we will call you to review the results.   Testing/Procedures: Your physician has requested that you have a carotid duplex. This test is an ultrasound of the carotid arteries in your neck. It looks at blood flow through these arteries that supply the brain with blood. Allow one hour for this exam. There are no restrictions or special instructions.    Follow-Up: At [facility], you and your health needs are our priority.  As part of our continuing mission to provide you with exceptional heart care, we have created designated Provider Care Teams.  These Care Teams include your primary Cardiologist (physician) and Advanced Practice Providers (APPs -  Physician Assistants and Nurse Practitioners) who all work together to provide you with the care you need, when you need it.  We recommend signing up for the patient portal called "MyChart".  Sign up information is provided on this After Visit Summary.  MyChart is used to connect with patients for Virtual Visits (Telemedicine).  Patients are able to view lab/test results, encounter notes, upcoming appointments, etc.  Non-urgent messages can be sent to your provider as well.   To learn more about what you can do with MyChart, go to [email or web address].    Your next appointment:   12 month(s)  The format for your next appointment:   In Person  Provider:   Robert Krasowski, MD     Other Instructions NA  

## 2023-09-25 NOTE — Progress Notes (Signed)
 Cardiology Office Note:    Date:  09/25/2023   ID:  Ashley Ritter, DOB [birth year], MRN [id number]  PCP:  Mercy Stall, MD  Cardiologist:  Ralene Burger, MD    Referring MD: Mercy Stall, MD   Chief Complaint  Patient presents with   Follow-up    History of Present Illness:    Ashley Ritter is a 83 y.o. 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   Syncope 11/03/2016   Vertigo     Past Surgical History:  Procedure Laterality Date   ABDOMINAL HYSTERECTOMY     GASTRIC RESTRICTION SURGERY     SHOULDER SURGERY     TONSILLECTOMY     TOTAL HIP ARTHROPLASTY Right 04/23/2017   Procedure: RIGHT TOTAL HIP ARTHROPLASTY ANTERIOR APPROACH;  Surgeon: Liliane Rei, MD;  Location: [facility];  Service: Orthopedics;  Laterality: Right;   TOTAL HIP ARTHROPLASTY Left 03/29/2020   Procedure: TOTAL HIP ARTHROPLASTY ANTERIOR APPROACH;  Surgeon: Liliane Rei, MD;  Location: [facility];  Service: Orthopedics;  Laterality: Left;    TUBAL LIGATION      Current Medications: Current Meds  Medication Sig   aspirin  EC 81 MG tablet Take 81 mg by mouth daily. Swallow whole.   ferrous fumarate-iron polysaccharide complex (TANDEM) 162-115.2 MG CAPS capsule Take 1 capsule by mouth daily.   Garlic (GARLIQUE) 400 MG TBEC Take 400 mg by mouth daily.   Multiple Vitamin (MULTIVITAMIN ADULT PO) Take 1 tablet by mouth daily.   triamterene-hydrochlorothiazide (MAXZIDE-25) 37.5-25 MG tablet TAKE 1 TABLET BY MOUTH EVERY DAY   VITAMIN D PO Take 1 tablet by mouth daily. Unknown strength     Allergies:   Penicillins and Oxycodone   Social History   Socioeconomic History   Marital status: Widowed    Spouse name: Not on file   Number of children: Not on file   Years of education: Not on file   Highest education level: Not on file  Occupational History   Occupation: Retired Producer, television/film/video  Tobacco Use   Smoking status: Former    Current packs/day: 0.00    Types: Cigarettes    Quit date: 1983    Years since quitting: 42.3   Smokeless tobacco: Never  Vaping Use   Vaping status: Never Used  Substance and Sexual Activity   Alcohol use: No   Drug use: No   Sexual activity: Not Currently  Other Topics Concern   Not on file  Social History Narrative   Not on file   Social Drivers of Health   Financial Resource Strain: Low Risk  (02/13/2023)   Overall Financial Resource Strain (CARDIA)  Difficulty of Paying Living Expenses: Not hard at all  Food Insecurity: No Food Insecurity (02/13/2023)   Hunger Vital Sign    Worried About Running Out of Food in the Last Year: Never true    Ran Out of Food in the Last Year: Never true  Transportation Needs: No Transportation Needs (02/13/2023)   PRAPARE - Administrator, Civil Service (Medical): No    Lack of Transportation (Non-Medical): No  Physical Activity: Inactive (02/13/2023)   Exercise Vital Sign    Days of Exercise per Week: 0 days    Minutes of Exercise per Session: 0 min  Stress: No Stress Concern Present (02/13/2023)   [employer] of Occupational  Health - Occupational Stress Questionnaire    Feeling of Stress : Not at all  Social Connections: Moderately Isolated (02/13/2023)   Social Connection and Isolation Panel [NHANES]    Frequency of Communication with Friends and Family: More than three times a week    Frequency of Social Gatherings with Friends and Family: More than three times a week    Attends Religious Services: More than 4 times per year    Active Member of [employer] or Organizations: No    Attends Banker Meetings: Never    Marital Status: Widowed     Family History: The patient's family history includes Dementia in her brother; Diabetes in her mother; Heart attack in her father; Heart disease in her father; Hyperlipidemia in her brother; Hypertension in her brother. ROS:   Please see the history of present illness.    All 14 point review of systems negative except as described per history of present illness  EKGs/Labs/Other Studies Reviewed:    EKG Interpretation Date/Time:  Thursday September 25 2023 16:21:33 EDT Ventricular Rate:  77 PR Interval:  154 QRS Duration:  82 QT Interval:  402 QTC Calculation: 454 R Axis:   -6  Text Interpretation: Sinus rhythm with Premature supraventricular complexes Nonspecific T wave abnormality Abnormal ECG When compared with ECG of 13-Nov-2016 10:24, Questionable change in QRS duration Confirmed by Gypsy Balsam [phone]) on 09/25/2023 4:26:26 PM    Recent Labs: 07/07/2023: TSH 0.683 07/28/2023: ALT 9; BUN 17; Creatinine 1.14; Hemoglobin 10.0; Platelet Count 282; Potassium 3.6; Sodium 142  Recent Lipid Panel    Component Value Date/Time   CHOL 187 07/07/2023 1643   TRIG 63 07/07/2023 1643   HDL 63 07/07/2023 1643   CHOLHDL 3.0 07/07/2023 1643   LDLCALC 112 (H) 07/07/2023 1643    Physical Exam:    VS:  BP 122/82 (BP Location: Right Arm, Patient Position: Sitting)   Pulse 77   Ht 5\' 2"  (1.575 m)   Wt 194 lb 12.8 oz (88.4 kg)   SpO2 97%   BMI 35.63 kg/m      Wt Readings from Last 3 Encounters:  09/25/23 194 lb 12.8 oz (88.4 kg)  07/28/23 195 lb 4.8 oz (88.6 kg)  07/07/23 198 lb (89.8 kg)     GEN:  Well nourished, well developed in no acute distress HEENT: Normal NECK: No JVD; No carotid bruits LYMPHATICS: No lymphadenopathy CARDIAC: RRR, no murmurs, no rubs, no gallops RESPIRATORY:  Clear to auscultation without rales, wheezing or rhonchi  ABDOMEN: Soft, non-tender, non-distended MUSCULOSKELETAL:  No edema; No deformity  SKIN: Warm and dry LOWER EXTREMITIES: no swelling NEUROLOGIC:  Alert and oriented x 3 PSYCHIATRIC:  Normal affect   ASSESSMENT:    1. Essential hypertension   2. Peripheral vascular disease (HCC)  3. Bilateral carotid artery stenosis   4. Mixed hyperlipidemia    PLAN:    In order of problems listed above:  Essential hypertension blood pressure well-controlled continue present management. Peripheral vascular disease will schedule her to have carotic ultrasound. Bilateral carotic arterial stenosis plan as described above, dyslipidemia I did review K PN which show a little improved cholesterol LDL of 112 HDL 63 still not where it supposed to be.  1 more time we had discussion about potentially taking cholesterol medication she does not want to do it.   Medication Adjustments/Labs and Tests Ordered: Current medicines are reviewed at length with the patient today.  Concerns regarding medicines are outlined above.  Orders Placed This Encounter  Procedures   EKG 12-Lead   Medication changes: No orders of the defined types were placed in this encounter.   Signed, Manfred Seed, MD, [facility] 09/25/2023 4:36 PM    [facility]

## 2023-10-07 ENCOUNTER — Ambulatory Visit: Payer: Medicare Other | Admitting: Family Medicine

## 2023-10-22 ENCOUNTER — Ambulatory Visit: Attending: Cardiology

## 2023-10-22 DIAGNOSIS — I6523 Occlusion and stenosis of bilateral carotid arteries: Secondary | ICD-10-CM | POA: Diagnosis present

## 2023-10-26 ENCOUNTER — Ambulatory Visit: Payer: Self-pay | Admitting: Cardiology

## 2023-10-29 ENCOUNTER — Telehealth: Payer: Self-pay | Admitting: Cardiology

## 2023-10-29 NOTE — Telephone Encounter (Signed)
 Patient is returning call to discuss carotid results.

## 2023-10-30 ENCOUNTER — Telehealth: Payer: Self-pay

## 2023-10-30 NOTE — Telephone Encounter (Signed)
Carotid US Results reviewed with pt as per Dr. Krasowski's note.  Pt verbalized understanding and had no additional questions. Routed to PCP  

## 2023-11-25 ENCOUNTER — Inpatient Hospital Stay: Payer: Medicare Other

## 2023-11-25 ENCOUNTER — Inpatient Hospital Stay: Payer: Medicare Other | Admitting: Oncology

## 2024-01-14 ENCOUNTER — Other Ambulatory Visit: Payer: Self-pay | Admitting: Oncology

## 2024-01-14 ENCOUNTER — Telehealth: Payer: Self-pay | Admitting: *Deleted

## 2024-01-14 DIAGNOSIS — D500 Iron deficiency anemia secondary to blood loss (chronic): Secondary | ICD-10-CM

## 2024-01-14 NOTE — Transitions of Care (Post Inpatient/ED Visit) (Signed)
   01/14/2024  Name: Ashley Ritter MRN: [id number] DOB: 1941/05/30  Today's TOC FU Call Status: Today's TOC FU Call Status:: Successful TOC FU Call Completed TOC FU Call Complete Date: 01/14/24 Patient's Name and Date of Birth confirmed.  Transition Care Management Follow-up Telephone Call Date of Discharge: 01/13/24 Discharge Facility: Other ([facility]) Name of Other ([facility]) Discharge Facility: [facility] Type of Discharge: Inpatient Admission Primary Inpatient Discharge Diagnosis:: Rectal bleeding/ Hematchezia/ Diverticular bleed How have you been since you were released from the hospital?: Better Any questions or concerns?: No  Items Reviewed: Did you receive and understand the discharge instructions provided?: Yes Medications obtained,verified, and reconciled?: Yes (Medications Reviewed) Dietary orders reviewed?: No Do you have support at home?: Yes People in Home [RPT]: alone Name of Support/Comfort Primary Source: [name]  Medications Reviewed Today: Medications Reviewed Today   Medications were not reviewed in this encounter     Home Care and Equipment/Supplies: Were Home Health Services Ordered?: NA Any new equipment or medical supplies ordered?: NA  Functional Questionnaire: Do you need assistance with bathing/showering or dressing?: No Do you need assistance with meal preparation?: No Do you need assistance with eating?: No Do you have difficulty maintaining continence: No Do you need assistance with getting out of bed/getting out of a chair/moving?: No Do you have difficulty managing or taking your medications?: No  Follow up appointments reviewed: PCP Follow-up appointment confirmed?: No MD Provider Line [phone] Given: Yes (RN discussed with patient the importance of following up with PCP) Specialist Hospital Follow-up appointment confirmed?: Yes Date of Specialist follow-up appointment?: 01/28/24 Follow-Up Specialty  Provider:: MaryShearin Do you need transportation to your follow-up appointment?: No Do you understand care options if your condition(s) worsen?: Yes-patient verbalized understanding  SDOH Interventions Today    Flowsheet Row Most Recent Value  SDOH Interventions   Food Insecurity Interventions Intervention Not Indicated  Housing Interventions Intervention Not Indicated  Transportation Interventions Intervention Not Indicated, Patient Resources (Friends/Family)  Utilities Interventions Intervention Not Indicated    Cathlean Headland BSN RN [facility] Care Management Coordinator [email or web address] Direct Dial: [phone]  Fax: [phone] Website: [email or web address]

## 2024-01-14 NOTE — Progress Notes (Unsigned)
 [facility] at [facility] [address] [place],  [place]  [address] [phone]  Clinic Day:  01/15/2024  Referring physician: Sherre Clapper, MD   HISTORY OF PRESENT ILLNESS:  The patient is an 83 y.o. 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  She comes in today to reassess her iron  and hemoglobin levels.  She last received IV iron  in June 2024.  Of note, the patient was just hospitalized this week as she had hematochezia for 2 days.  The patient claims to have had 3 bowel movements of a copious amount of blood over the past weekend.  During that admission, her hemoglobin fell as low as 7.1.  IV iron  was given, but a blood transfusion was held.  No GI procedure was done.  Of note, she had a colonoscopy in July 2024, which showed a rectal polyp and findings that suggested a recent diverticular bleeding.     PHYSICAL EXAM:  Blood pressure (!) 117/52, pulse 65, temperature 98.6 F (37 C), temperature source Oral, resp. rate 16, height 5' 2 (1.575 m), weight 190 lb (86.2 kg). Wt Readings from Last 3 Encounters:  01/15/24 190 lb (86.2 kg)  09/25/23 194 lb 12.8 oz (88.4 kg)  07/28/23 195 lb 4.8 oz (88.6 kg)   Body mass index is 34.75 kg/m. Performance status (ECOG): 1 - Symptomatic but completely ambulatory Physical Exam Constitutional:      Appearance: Normal appearance. She is not ill-appearing.     Comments: She is ambulating with a cane   HENT:     Mouth/Throat:     Mouth: Mucous membranes are moist.     Pharynx: Oropharynx is clear. No oropharyngeal exudate or posterior oropharyngeal erythema.  Cardiovascular:     Rate and Rhythm: Normal rate and regular rhythm.     Heart sounds: No murmur heard.    No friction rub. No gallop.  Pulmonary:     Effort: Pulmonary effort is normal. No respiratory distress.     Breath sounds: Normal breath sounds. No wheezing, rhonchi or rales.   Abdominal:     General: Bowel sounds are normal. There is no distension.     Palpations: Abdomen is soft. There is no mass.     Tenderness: There is no abdominal tenderness.  Musculoskeletal:        General: No swelling.     Right lower leg: No edema.     Left lower leg: No edema.  Lymphadenopathy:     Cervical: No cervical adenopathy.     Upper Body:     Right upper body: No supraclavicular or axillary adenopathy.     Left upper body: No supraclavicular or axillary adenopathy.     Lower Body: No right inguinal adenopathy. No left inguinal adenopathy.  Skin:    General: Skin is warm.     Coloration: Skin is not jaundiced.     Findings: No lesion or rash.  Neurological:     General: No focal deficit present.     Mental Status: She is alert and oriented to person, place, and time. Mental status is at baseline.  Psychiatric:        Mood and Affect: Mood normal.        Behavior: Behavior normal.        Thought Content: Thought content normal.    LABS:      Latest Ref Rng & Units 01/15/2024  11:10 AM 07/28/2023    3:22 PM 07/07/2023    4:43 PM  CBC  WBC 4.0 - 10.5 K/uL 6.8  6.2  6.6   Hemoglobin 12.0 - 15.0 g/dL 6.9  89.9  89.4   Hematocrit 36.0 - 46.0 % 22.3  31.5  33.6   Platelets 150 - 400 K/uL 253  282  290       Latest Ref Rng & Units 01/15/2024   11:10 AM 07/28/2023    3:22 PM 07/07/2023    4:43 PM  CMP  Glucose 70 - 99 mg/dL 876  892  86   BUN 8 - 23 mg/dL 22  17  17    Creatinine 0.44 - 1.00 mg/dL 8.80  8.85  8.97   Sodium 135 - 145 mmol/L 140  142  141   Potassium 3.5 - 5.1 mmol/L 3.8  3.6  4.1   Chloride 98 - 111 mmol/L 104  102  100   CO2 22 - 32 mmol/L 26  30  27    Calcium 8.9 - 10.3 mg/dL 9.1  9.7  9.6   Total Protein 6.5 - 8.1 g/dL 6.4  7.1  6.8   Total Bilirubin 0.0 - 1.2 mg/dL 0.3  0.4  0.5   Alkaline Phos 38 - 126 U/L 56  69  62   AST 15 - 41 U/L 16  18  14    ALT 0 - 44 U/L 6  9  8      Latest Reference Range & Units 01/15/24 11:10  Iron  28 - 170  ug/dL 41  UIBC ug/dL 700  TIBC 749 - 549 ug/dL 659  Saturation Ratios 10.4 - 31.8 % 12  Ferritin 11 - 307 ng/mL 99  Folate >5.9 ng/mL 15.8  Vitamin B12 180 - 914 pg/mL 1,483 (H)  (H): Data is abnormally high  ASSESSMENT & PLAN:  Assessment/Plan:  An 83 y.o. female with iron  deficiency anemia.  Her hemoglobin of 6.9 is much lower than previously.  Furthermore, while in clinic today, she has a large bowel movement of pure blood, which filled the toilet, as well as some being on the floor.  Her hemoglobin of 6.9 was before she had this bloody stool.  Due to her being even weaker afterwards, I recommended that she go back to [facility] to be evaluated.  Although she had a colonoscopy last year, I do believe another one needs to be done as she has had voluminous blood loss with her bowel movements over this past week to where the origin needs to be located and hopefully treated.  Otherwise, I will see this patient back in 6 weeks for repeat clinical assessment.   The patient understands all the plans discussed today and is in agreement with them.    Aztlan Coll DELENA Kerns, MD

## 2024-01-15 ENCOUNTER — Telehealth: Payer: Self-pay

## 2024-01-15 ENCOUNTER — Inpatient Hospital Stay: Attending: Oncology

## 2024-01-15 ENCOUNTER — Encounter: Payer: Self-pay | Admitting: Oncology

## 2024-01-15 ENCOUNTER — Other Ambulatory Visit: Payer: Self-pay

## 2024-01-15 ENCOUNTER — Inpatient Hospital Stay (HOSPITAL_BASED_OUTPATIENT_CLINIC_OR_DEPARTMENT_OTHER): Admitting: Oncology

## 2024-01-15 ENCOUNTER — Other Ambulatory Visit: Payer: Self-pay | Admitting: Oncology

## 2024-01-15 VITALS — BP 117/52 | HR 65 | Temp 98.6°F | Resp 16 | Ht 62.0 in | Wt 190.0 lb

## 2024-01-15 DIAGNOSIS — D508 Other iron deficiency anemias: Secondary | ICD-10-CM

## 2024-01-15 DIAGNOSIS — K921 Melena: Secondary | ICD-10-CM | POA: Diagnosis not present

## 2024-01-15 DIAGNOSIS — D509 Iron deficiency anemia, unspecified: Secondary | ICD-10-CM | POA: Insufficient documentation

## 2024-01-15 DIAGNOSIS — D500 Iron deficiency anemia secondary to blood loss (chronic): Secondary | ICD-10-CM

## 2024-01-15 LAB — CMP (CANCER CENTER ONLY)
ALT: 6 U/L (ref 0–44)
AST: 16 U/L (ref 15–41)
Albumin: 3.7 g/dL (ref 3.5–5.0)
Alkaline Phosphatase: 56 U/L (ref 38–126)
Anion gap: 11 (ref 5–15)
BUN: 22 mg/dL (ref 8–23)
CO2: 26 mmol/L (ref 22–32)
Calcium: 9.1 mg/dL (ref 8.9–10.3)
Chloride: 104 mmol/L (ref 98–111)
Creatinine: 1.19 mg/dL — ABNORMAL HIGH (ref 0.44–1.00)
GFR, Estimated: 45 mL/min — ABNORMAL LOW (ref >60)
Glucose, Bld: 123 mg/dL — ABNORMAL HIGH (ref 70–99)
Potassium: 3.8 mmol/L (ref 3.5–5.1)
Sodium: 140 mmol/L (ref 135–145)
Total Bilirubin: 0.3 mg/dL (ref 0.0–1.2)
Total Protein: 6.4 g/dL — ABNORMAL LOW (ref 6.5–8.1)

## 2024-01-15 LAB — VITAMIN B12: Vitamin B-12: 1483 pg/mL — ABNORMAL HIGH (ref 180–914)

## 2024-01-15 LAB — CBC WITH DIFFERENTIAL (CANCER CENTER ONLY)
Abs Immature Granulocytes: 0.03 K/uL (ref 0.00–0.07)
Basophils Absolute: 0 K/uL (ref 0.0–0.1)
Basophils Relative: 0 %
Eosinophils Absolute: 0.2 K/uL (ref 0.0–0.5)
Eosinophils Relative: 3 %
HCT: 22.3 % — ABNORMAL LOW (ref 36.0–46.0)
Hemoglobin: 6.9 g/dL — CL (ref 12.0–15.0)
Immature Granulocytes: 0 %
Lymphocytes Relative: 27 %
Lymphs Abs: 1.9 K/uL (ref 0.7–4.0)
MCH: 27.9 pg (ref 26.0–34.0)
MCHC: 30.9 g/dL (ref 30.0–36.0)
MCV: 90.3 fL (ref 80.0–100.0)
Monocytes Absolute: 0.5 K/uL (ref 0.1–1.0)
Monocytes Relative: 7 %
Neutro Abs: 4.3 K/uL (ref 1.7–7.7)
Neutrophils Relative %: 63 %
Platelet Count: 253 K/uL (ref 150–400)
RBC: 2.47 MIL/uL — ABNORMAL LOW (ref 3.87–5.11)
RDW: 13.8 % (ref 11.5–15.5)
WBC Count: 6.8 K/uL (ref 4.0–10.5)
nRBC: 0.3 % — ABNORMAL HIGH (ref 0.0–0.2)

## 2024-01-15 LAB — FERRITIN: Ferritin: 99 ng/mL (ref 11–307)

## 2024-01-15 LAB — IRON AND TIBC
Iron: 41 ug/dL (ref 28–170)
Saturation Ratios: 12 % (ref 10.4–31.8)
TIBC: 340 ug/dL (ref 250–450)
UIBC: 299 ug/dL

## 2024-01-15 LAB — TYPE AND SCREEN
ABO/RH(D): B POS
Antibody Screen: NEGATIVE

## 2024-01-15 LAB — FOLATE: Folate: 15.8 ng/mL (ref >5.9)

## 2024-01-15 NOTE — Telephone Encounter (Signed)
 CRITICAL VALUE STICKER  CRITICAL VALUE: Hgb 6.9  RECEIVER (on-site recipient of call):Keyairra Kolinski Gleneagle, RN   DATE & TIME NOTIFIED: 01/15/24 1124  MESSENGER (representative from lab):Kimberly   MD NOTIFIED: Dr. Ezzard   TIME OF NOTIFICATION: 1126  RESPONSE:  Noted.

## 2024-01-16 ENCOUNTER — Encounter

## 2024-01-18 NOTE — Discharge Summary (Signed)
 Hospitalist Discharge Summary   Name: Ashley Ritter Age: 83 yrs  MRN: [id number] DOB: 02/10/1941  Admit Date: 01/15/2024 Admitting Physician: Derryl MARLA Course, MD  Discharge Date and Time: Sun 01/18/2024 Discharge Physician: Otelia Dolly, M.D    Admission Diagnoses:   Acute lower GI bleeding [K92.2] Lower GI bleed [K92.2] Severe anemia [D64.9] ABLA (acute blood loss anemia) [D62]   Discharge Diagnoses:   Principal Problem:   Lower GI bleed Active Problems:   CKD (chronic kidney disease) stage 2, GFR 60-89 ml/min   Anemia   Diverticulosis   Primary hypertension   H/O colectomy Resolved Problems:   * No resolved hospital problems. *     *For documentation of patient's Past Medical History and Problem List, see the last page.     Indication for Hospitalization/Hospital Course:   For the details of admission, please see the H&P on 01/15/2024.  For full details, please see progress notes, consult notes and ancillary notes.  The patient's hospital course will be summarized in a problem based approach below.   Bright red blood per rectum causing blood loss anemia. Reportedly received a unit of blood in ED. GI consulted and performed colonoscopy 01/16/2024.  This showed ulcerated ileocolonic anastomosis, mild diverticulosis, nodular mucosa which were biopsied. Hgb remained stable and she had no bloody Bms so discharged in stable condition. Resume iron    HTN Home meds resumed   Hypomagnesemia  Supplemented  The patient's chronic medical conditions were treated accordingly per the patient's home medication regimen except as noted in the plan above and in the medication list below.    Discharge Condition:   Disposition: Patient discharged to Home or Self Care in stable condition.   Physical Exam at Discharge   BP 124/64 (BP Location: Left arm, Patient Position: Lying)   Pulse 72   Temp 98.3 F (36.8 C) (Oral)   Resp 18   Ht 1.575 m (5' 2)   Wt 85.4 kg (188 lb  4.4 oz)   SpO2 100%   BMI 34.44 kg/m    General: Alert, oriented X 4, In NAD  Cardiovascular: Regular rate and rhythm, s1, s2, no murmurs.  Chest/Lungs: Clear to auscultation bilaterally, no wheezes or rhonchi.  Abdomen: Positive bowel sounds. Abdomen soft, nondistended, nontender.    Psych: Pleasant mood, good insight. Full affect       Discharge Medications:      Medication List     CONTINUE taking these medications    aspirin 81 mg EC tablet Take 81 mg by mouth Once Daily.   B12 ACTIVE ORAL Take 1 tablet by mouth daily.   cholecalciferol 10 mcg (400 unit) tablet Commonly known as: VITAMIN D3 Take 1 tablet by mouth daily.   garlic 100 mg Tab Take 1 tablet by mouth daily.   omeprazole 20 mg DR capsule Commonly known as: PriLOSEC Take 1 capsule (20 mg total) by mouth in the morning.   Tandem Dual Action 162-115.2 (106) mg Cap Generic drug: ferrous fumarate-iron  ps cmplx Take 1 capsule by mouth daily.   triamterene -hydroCHLOROthiazide  37.5-25 mg per tablet Commonly known as: MAXZIDE -25 Take 1 tablet by mouth daily.          Significant Diagnostic Tests:    No orders to display    LABS:  Recent Labs    01/15/24 1407 01/16/24 0026 01/17/24 0400 01/18/24 0022  NA 139 139 140 140  K 3.8 4.2 3.7 3.8  CL 104 105 107 106  CO2 30 29 28  29  BUN 21 22 17 15   CREATININE 1.11 1.08 1.14 1.14  AST 12*  --   --   --   ALT 6*  --   --   --   PROT 6.3*  --   --   --   ALBUMIN 3.6  --   --   --   BILITOT 0.4  --   --   --     Lab Results  Component Value Date   WBC 8.22 01/18/2024   HGB 7.4 (L) 01/18/2024   HCT 21.9 (L) 01/18/2024   PLT 223 01/18/2024   ALT 6 (L) 01/15/2024   AST 12 (L) 01/15/2024   NA 140 01/18/2024   K 3.8 01/18/2024   CL 106 01/18/2024   CREATININE 1.14 01/18/2024   BUN 15 01/18/2024   CO2 29 01/18/2024   INR 1.0 01/15/2024     Surgeries/Procedures:    Other procedures performed:   Consults:   IP CONSULT TO  GASTROENTEROLOGY IP CONSULT TO HOSPITALIST   Follow-up Appointments:     Future Appointments  Date Time Provider Department Center  01/28/2024 11:00 AM Mabel Ned [place], PA-C [facility] G[facility]  05/12/2024  3:00 PM Eulas Winnifred Manes, MD [facility] NEP PRE [facility]      Appointments which have been scheduled for you    Jan 28, 2024 11:00 AM Office Visit with Mabel Ned [place], PA-C [facility] - [place]STROENTEROLOGY [place] [facility] [place]) [address] [place] [address] [phone]  Please arrive 15 minutes prior to your scheduled visit.     May 12, 2024 3:00 PM Office Visit with Eulas Winnifred Manes, MD [facility]  - NEPHROLOGY [place] [facility] [place] [address] [place] [address] [phone]  Please arrive 15 minutes prior to your scheduled visit.          Contact information during this hospitalization (Please re-verify post-discharge): PCP: Abigail Justino Free, MD PCP Phone: [phone]        Medical History[1] Patient Active Problem List   Diagnosis Date Noted  . Lower GI bleed 01/15/2024  . Diverticulosis 01/15/2024  . Primary hypertension 01/15/2024  . H/O colectomy 01/15/2024  . GI bleed 05/18/2021  . CKD (chronic kidney disease) stage 2, GFR 60-89 ml/min 03/28/2019  . Anemia 03/28/2019     Time spent on discharge: 25 minutes.       [1] Past Medical History: Diagnosis Date  . Colon polyp

## 2024-01-19 ENCOUNTER — Telehealth: Payer: Self-pay

## 2024-01-19 NOTE — Transitions of Care (Post Inpatient/ED Visit) (Signed)
   01/19/2024  Name: Kristyna Bradstreet MRN: [id number] DOB: [birth year]  Today's TOC FU Call Status: Today's TOC FU Call Status:: Successful TOC FU Call Completed TOC FU Call Complete Date: 01/19/24 (Spoke with patient who declined TOC calls/program) Patient's Name and Date of Birth confirmed.  Transition Care Management Follow-up Telephone Call    Shona Prow RN, CCM [facility]  VBCI-Population Health RN Care Manager [phone]

## 2024-01-19 NOTE — Transitions of Care (Post Inpatient/ED Visit) (Signed)
   01/19/2024  Name: Ashley Ritter MRN: [id number] DOB: January 04, 1941  Today's TOC FU Call Status: Today's TOC FU Call Status:: Unsuccessful Call (1st Attempt) Unsuccessful Call (1st Attempt) Date: 01/19/24  Attempted to reach the patient regarding the most recent Inpatient/ED visit.  Follow Up Plan: Additional outreach attempts will be made to reach the patient to complete the Transitions of Care (Post Inpatient/ED visit) call.   Shona Prow RN, CCM [place]  VBCI-Population Health RN Care Manager [phone]

## 2024-01-21 ENCOUNTER — Inpatient Hospital Stay: Admitting: Family Medicine

## 2024-01-22 ENCOUNTER — Ambulatory Visit: Payer: Self-pay | Admitting: Family Medicine

## 2024-02-03 LAB — HM MAMMOGRAPHY

## 2024-02-05 ENCOUNTER — Encounter: Payer: Self-pay | Admitting: Family Medicine

## 2024-02-05 ENCOUNTER — Ambulatory Visit: Payer: Self-pay | Admitting: Family Medicine

## 2024-02-25 ENCOUNTER — Other Ambulatory Visit: Payer: Self-pay | Admitting: Oncology

## 2024-02-25 DIAGNOSIS — D508 Other iron deficiency anemias: Secondary | ICD-10-CM

## 2024-02-25 NOTE — Progress Notes (Unsigned)
 [facility] at [facility] [address] [place],  [place]  [address] [phone]  Clinic Day:  02/25/2024  Referring physician: Sherre Clapper, MD   HISTORY OF PRESENT ILLNESS:  The patient is an 83 y.o. 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  She comes in today to reassess her iron  and hemoglobin levels.  At her last visit, she was bleeding profusely, which landed her in the hospital.  A colonoscopy during that hospitalization showed active colitis with ulceration and granulation tissue at her ileocecal valve.    PHYSICAL EXAM:  There were no vitals taken for this visit. Wt Readings from Last 3 Encounters:  01/15/24 190 lb (86.2 kg)  09/25/23 194 lb 12.8 oz (88.4 kg)  07/28/23 195 lb 4.8 oz (88.6 kg)   There is no height or weight on file to calculate BMI. Performance status (ECOG): 1 - Symptomatic but completely ambulatory Physical Exam Constitutional:      Appearance: Normal appearance. She is not ill-appearing.     Comments: She is ambulating with a cane   HENT:     Mouth/Throat:     Mouth: Mucous membranes are moist.     Pharynx: Oropharynx is clear. No oropharyngeal exudate or posterior oropharyngeal erythema.  Cardiovascular:     Rate and Rhythm: Normal rate and regular rhythm.     Heart sounds: No murmur heard.    No friction rub. No gallop.  Pulmonary:     Effort: Pulmonary effort is normal. No respiratory distress.     Breath sounds: Normal breath sounds. No wheezing, rhonchi or rales.  Abdominal:     General: Bowel sounds are normal. There is no distension.     Palpations: Abdomen is soft. There is no mass.     Tenderness: There is no abdominal tenderness.  Musculoskeletal:        General: No swelling.     Right lower leg: No edema.     Left lower leg: No edema.  Lymphadenopathy:     Cervical: No cervical adenopathy.     Upper Body:     Right upper body: No  supraclavicular or axillary adenopathy.     Left upper body: No supraclavicular or axillary adenopathy.     Lower Body: No right inguinal adenopathy. No left inguinal adenopathy.  Skin:    General: Skin is warm.     Coloration: Skin is not jaundiced.     Findings: No lesion or rash.  Neurological:     General: No focal deficit present.     Mental Status: She is alert and oriented to person, place, and time. Mental status is at baseline.  Psychiatric:        Mood and Affect: Mood normal.        Behavior: Behavior normal.        Thought Content: Thought content normal.    LABS:      Latest Ref Rng & Units 01/15/2024   11:10 AM 07/28/2023    3:22 PM 07/07/2023    4:43 PM  CBC  WBC 4.0 - 10.5 K/uL 6.8  6.2  6.6   Hemoglobin 12.0 - 15.0 g/dL 6.9  89.9  89.4   Hematocrit 36.0 - 46.0 % 22.3  31.5  33.6   Platelets 150 - 400 K/uL 253  282  290       Latest Ref Rng & Units 01/15/2024   11:10 AM  07/28/2023    3:22 PM 07/07/2023    4:43 PM  CMP  Glucose 70 - 99 mg/dL 876  892  86   BUN 8 - 23 mg/dL 22  17  17    Creatinine 0.44 - 1.00 mg/dL 8.80  8.85  8.97   Sodium 135 - 145 mmol/L 140  142  141   Potassium 3.5 - 5.1 mmol/L 3.8  3.6  4.1   Chloride 98 - 111 mmol/L 104  102  100   CO2 22 - 32 mmol/L 26  30  27    Calcium 8.9 - 10.3 mg/dL 9.1  9.7  9.6   Total Protein 6.5 - 8.1 g/dL 6.4  7.1  6.8   Total Bilirubin 0.0 - 1.2 mg/dL 0.3  0.4  0.5   Alkaline Phos 38 - 126 U/L 56  69  62   AST 15 - 41 U/L 16  18  14    ALT 0 - 44 U/L 6  9  8      Latest Reference Range & Units 01/15/24 11:10  Iron  28 - 170 ug/dL 41  UIBC ug/dL 700  TIBC 749 - 549 ug/dL 659  Saturation Ratios 10.4 - 31.8 % 12  Ferritin 11 - 307 ng/mL 99  Folate >5.9 ng/mL 15.8  Vitamin B12 180 - 914 pg/mL 1,483 (H)  (H): Data is abnormally high  ASSESSMENT & PLAN:  Assessment/Plan:  An 83 y.o. female with iron  deficiency anemia.  Her hemoglobin of 6.9 is much lower than previously.  Furthermore, while in clinic today,  she has a large bowel movement of pure blood, which filled the toilet, as well as some being on the floor.  Her hemoglobin of 6.9 was before she had this bloody stool.  Due to her being even weaker afterwards, I recommended that she go back to [facility] to be evaluated.  Although she had a colonoscopy last year, I do believe another one needs to be done as she has had voluminous blood loss with her bowel movements over this past week to where the origin needs to be located and hopefully treated.  Otherwise, I will see this patient back in 6 weeks for repeat clinical assessment.   The patient understands all the plans discussed today and is in agreement with them.    Monique Gift DELENA Kerns, MD

## 2024-02-26 ENCOUNTER — Inpatient Hospital Stay

## 2024-02-26 ENCOUNTER — Inpatient Hospital Stay: Attending: Oncology | Admitting: Oncology

## 2024-02-26 ENCOUNTER — Other Ambulatory Visit: Payer: Self-pay

## 2024-02-26 ENCOUNTER — Telehealth: Payer: Self-pay | Admitting: Oncology

## 2024-02-26 ENCOUNTER — Encounter: Payer: Self-pay | Admitting: Oncology

## 2024-02-26 ENCOUNTER — Other Ambulatory Visit: Payer: Self-pay | Admitting: Oncology

## 2024-02-26 VITALS — BP 141/55 | HR 60 | Temp 98.2°F | Resp 18 | Ht 62.0 in | Wt 190.8 lb

## 2024-02-26 DIAGNOSIS — D509 Iron deficiency anemia, unspecified: Secondary | ICD-10-CM | POA: Insufficient documentation

## 2024-02-26 DIAGNOSIS — D508 Other iron deficiency anemias: Secondary | ICD-10-CM

## 2024-02-26 DIAGNOSIS — D500 Iron deficiency anemia secondary to blood loss (chronic): Secondary | ICD-10-CM

## 2024-02-26 LAB — CBC WITH DIFFERENTIAL (CANCER CENTER ONLY)
Abs Immature Granulocytes: 0.01 K/uL (ref 0.00–0.07)
Basophils Absolute: 0 K/uL (ref 0.0–0.1)
Basophils Relative: 1 %
Eosinophils Absolute: 0.2 K/uL (ref 0.0–0.5)
Eosinophils Relative: 3 %
HCT: 31.6 % — ABNORMAL LOW (ref 36.0–46.0)
Hemoglobin: 10 g/dL — ABNORMAL LOW (ref 12.0–15.0)
Immature Granulocytes: 0 %
Lymphocytes Relative: 25 %
Lymphs Abs: 1.4 K/uL (ref 0.7–4.0)
MCH: 28.3 pg (ref 26.0–34.0)
MCHC: 31.6 g/dL (ref 30.0–36.0)
MCV: 89.5 fL (ref 80.0–100.0)
Monocytes Absolute: 0.4 K/uL (ref 0.1–1.0)
Monocytes Relative: 8 %
Neutro Abs: 3.5 K/uL (ref 1.7–7.7)
Neutrophils Relative %: 63 %
Platelet Count: 253 K/uL (ref 150–400)
RBC: 3.53 MIL/uL — ABNORMAL LOW (ref 3.87–5.11)
RDW: 12.9 % (ref 11.5–15.5)
WBC Count: 5.5 K/uL (ref 4.0–10.5)
nRBC: 0 % (ref 0.0–0.2)

## 2024-02-26 LAB — CMP (CANCER CENTER ONLY)
ALT: 8 U/L (ref 0–44)
AST: 15 U/L (ref 15–41)
Albumin: 3.9 g/dL (ref 3.5–5.0)
Alkaline Phosphatase: 61 U/L (ref 38–126)
Anion gap: 10 (ref 5–15)
BUN: 22 mg/dL (ref 8–23)
CO2: 27 mmol/L (ref 22–32)
Calcium: 9.9 mg/dL (ref 8.9–10.3)
Chloride: 103 mmol/L (ref 98–111)
Creatinine: 1.1 mg/dL — ABNORMAL HIGH (ref 0.44–1.00)
GFR, Estimated: 50 mL/min — ABNORMAL LOW (ref >60)
Glucose, Bld: 105 mg/dL — ABNORMAL HIGH (ref 70–99)
Potassium: 4.5 mmol/L (ref 3.5–5.1)
Sodium: 140 mmol/L (ref 135–145)
Total Bilirubin: 0.3 mg/dL (ref 0.0–1.2)
Total Protein: 7.2 g/dL (ref 6.5–8.1)

## 2024-02-26 LAB — FERRITIN: Ferritin: 33 ng/mL (ref 11–307)

## 2024-02-26 LAB — IRON AND TIBC
Iron: 55 ug/dL (ref 28–170)
Saturation Ratios: 15 % (ref 10.4–31.8)
TIBC: 374 ug/dL (ref 250–450)
UIBC: 319 ug/dL

## 2024-02-26 NOTE — Telephone Encounter (Signed)
 Patient has been scheduled for follow-up visit per 02/25/24 LOS.  Pt given an appt calendar with date and time.

## 2024-03-01 ENCOUNTER — Telehealth: Payer: Self-pay

## 2024-03-01 NOTE — Telephone Encounter (Signed)
 Latest Reference Range & Units 02/26/24 10:13  Iron  28 - 170 ug/dL 55  UIBC ug/dL 680  TIBC 749 - 549 ug/dL 625  Saturation Ratios 10.4 - 31.8 % 15  Ferritin 11 - 307 ng/mL 33    Her iron  parameters are also decent today.  I have no problem with her taking an iron  pill on a daily basis to help with the fortification of her iron  stores.  Moving forward, her CBC will be followed monthly to ensure there is no precipitous decline in her hemoglobin.  I will see her back in 4 months for repeat clinical assessment.  The patient understands all the plans discussed today and is in agreement with them.     Dequincy DELENA Kerns, MD

## 2024-03-25 ENCOUNTER — Inpatient Hospital Stay: Attending: Oncology

## 2024-03-25 DIAGNOSIS — D508 Other iron deficiency anemias: Secondary | ICD-10-CM

## 2024-03-25 DIAGNOSIS — D509 Iron deficiency anemia, unspecified: Secondary | ICD-10-CM | POA: Diagnosis present

## 2024-03-25 LAB — CBC WITH DIFFERENTIAL (CANCER CENTER ONLY)
Abs Immature Granulocytes: 0.01 K/uL (ref 0.00–0.07)
Basophils Absolute: 0 K/uL (ref 0.0–0.1)
Basophils Relative: 1 %
Eosinophils Absolute: 0.2 K/uL (ref 0.0–0.5)
Eosinophils Relative: 3 %
HCT: 33.7 % — ABNORMAL LOW (ref 36.0–46.0)
Hemoglobin: 10.5 g/dL — ABNORMAL LOW (ref 12.0–15.0)
Immature Granulocytes: 0 %
Lymphocytes Relative: 31 %
Lymphs Abs: 1.8 K/uL (ref 0.7–4.0)
MCH: 27.3 pg (ref 26.0–34.0)
MCHC: 31.2 g/dL (ref 30.0–36.0)
MCV: 87.5 fL (ref 80.0–100.0)
Monocytes Absolute: 0.5 K/uL (ref 0.1–1.0)
Monocytes Relative: 9 %
Neutro Abs: 3.5 K/uL (ref 1.7–7.7)
Neutrophils Relative %: 56 %
Platelet Count: 268 K/uL (ref 150–400)
RBC: 3.85 MIL/uL — ABNORMAL LOW (ref 3.87–5.11)
RDW: 13.5 % (ref 11.5–15.5)
WBC Count: 6 K/uL (ref 4.0–10.5)
nRBC: 0 % (ref 0.0–0.2)

## 2024-03-25 LAB — CMP (CANCER CENTER ONLY)
ALT: 7 U/L (ref 0–44)
AST: 16 U/L (ref 15–41)
Albumin: 4.2 g/dL (ref 3.5–5.0)
Alkaline Phosphatase: 63 U/L (ref 38–126)
Anion gap: 10 (ref 5–15)
BUN: 17 mg/dL (ref 8–23)
CO2: 29 mmol/L (ref 22–32)
Calcium: 9.9 mg/dL (ref 8.9–10.3)
Chloride: 102 mmol/L (ref 98–111)
Creatinine: 1.19 mg/dL — ABNORMAL HIGH (ref 0.44–1.00)
GFR, Estimated: 45 mL/min — ABNORMAL LOW (ref >60)
Glucose, Bld: 98 mg/dL (ref 70–99)
Potassium: 3.9 mmol/L (ref 3.5–5.1)
Sodium: 140 mmol/L (ref 135–145)
Total Bilirubin: 0.5 mg/dL (ref 0.0–1.2)
Total Protein: 7.4 g/dL (ref 6.5–8.1)

## 2024-04-22 ENCOUNTER — Inpatient Hospital Stay: Attending: Oncology

## 2024-04-22 DIAGNOSIS — D509 Iron deficiency anemia, unspecified: Secondary | ICD-10-CM | POA: Insufficient documentation

## 2024-04-22 DIAGNOSIS — D508 Other iron deficiency anemias: Secondary | ICD-10-CM

## 2024-04-22 LAB — CBC WITH DIFFERENTIAL (CANCER CENTER ONLY)
Abs Immature Granulocytes: 0.02 K/uL (ref 0.00–0.07)
Basophils Absolute: 0 K/uL (ref 0.0–0.1)
Basophils Relative: 1 %
Eosinophils Absolute: 0.1 K/uL (ref 0.0–0.5)
Eosinophils Relative: 2 %
HCT: 27.7 % — ABNORMAL LOW (ref 36.0–46.0)
Hemoglobin: 8.8 g/dL — ABNORMAL LOW (ref 12.0–15.0)
Immature Granulocytes: 0 %
Lymphocytes Relative: 32 %
Lymphs Abs: 2.3 K/uL (ref 0.7–4.0)
MCH: 27.8 pg (ref 26.0–34.0)
MCHC: 31.8 g/dL (ref 30.0–36.0)
MCV: 87.4 fL (ref 80.0–100.0)
Monocytes Absolute: 0.7 K/uL (ref 0.1–1.0)
Monocytes Relative: 9 %
Neutro Abs: 3.9 K/uL (ref 1.7–7.7)
Neutrophils Relative %: 56 %
Platelet Count: 284 K/uL (ref 150–400)
RBC: 3.17 MIL/uL — ABNORMAL LOW (ref 3.87–5.11)
RDW: 14.5 % (ref 11.5–15.5)
WBC Count: 7 K/uL (ref 4.0–10.5)
nRBC: 0 % (ref 0.0–0.2)

## 2024-05-11 ENCOUNTER — Encounter: Payer: Self-pay | Admitting: Oncology

## 2024-05-12 ENCOUNTER — Encounter: Payer: Self-pay | Admitting: Oncology

## 2024-05-20 ENCOUNTER — Inpatient Hospital Stay

## 2024-06-12 ENCOUNTER — Encounter: Payer: Self-pay | Admitting: Oncology

## 2024-06-16 ENCOUNTER — Other Ambulatory Visit: Payer: Self-pay | Admitting: Oncology

## 2024-06-16 DIAGNOSIS — D508 Other iron deficiency anemias: Secondary | ICD-10-CM

## 2024-06-16 NOTE — Progress Notes (Signed)
 " [facility] at [facility] [address] [place],  [place]  [address] [phone]  Clinic Day:  06/17/2024  Referring physician: Sherre Clapper, MD   HISTORY OF PRESENT ILLNESS:  The patient is an 84 y.o. 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  She comes in today to reassess her iron  and hemoglobin levels.  Since her last visit, the patient has been doing well.  Despite a recent colonoscopy showing active colitis, the patient denies having any GI blood loss recently.  PHYSICAL EXAM:  Blood pressure (!) 137/105, pulse 70, temperature 98.3 F (36.8 C), temperature source Oral, resp. rate 16, height 5' 2 (1.575 m), weight 188 lb 9.6 oz (85.5 kg), SpO2 96%. Wt Readings from Last 3 Encounters:  06/17/24 188 lb 9.6 oz (85.5 kg)  02/26/24 190 lb 12.8 oz (86.5 kg)  01/15/24 190 lb (86.2 kg)   Body mass index is 34.5 kg/m. Performance status (ECOG): 1 - Symptomatic but completely ambulatory Physical Exam Constitutional:      Appearance: Normal appearance. She is not ill-appearing.     Comments: She is ambulating with a cane   HENT:     Mouth/Throat:     Mouth: Mucous membranes are moist.     Pharynx: Oropharynx is clear. No oropharyngeal exudate or posterior oropharyngeal erythema.  Cardiovascular:     Rate and Rhythm: Normal rate and regular rhythm.     Heart sounds: No murmur heard.    No friction rub. No gallop.  Pulmonary:     Effort: Pulmonary effort is normal. No respiratory distress.     Breath sounds: Normal breath sounds. No wheezing, rhonchi or rales.  Abdominal:     General: Bowel sounds are normal. There is no distension.     Palpations: Abdomen is soft. There is no mass.     Tenderness: There is no abdominal tenderness.  Musculoskeletal:        General: No swelling.     Right lower leg: No edema.     Left lower leg: No edema.  Lymphadenopathy:     Cervical: No cervical  adenopathy.     Upper Body:     Right upper body: No supraclavicular or axillary adenopathy.     Left upper body: No supraclavicular or axillary adenopathy.     Lower Body: No right inguinal adenopathy. No left inguinal adenopathy.  Skin:    General: Skin is warm.     Coloration: Skin is not jaundiced.     Findings: No lesion or rash.  Neurological:     General: No focal deficit present.     Mental Status: She is alert and oriented to person, place, and time. Mental status is at baseline.  Psychiatric:        Mood and Affect: Mood normal.        Behavior: Behavior normal.        Thought Content: Thought content normal.    LABS:      Latest Ref Rng & Units 06/17/2024    3:07 PM 04/22/2024    1:17 PM 03/25/2024    1:16 PM  CBC  WBC 4.0 - 10.5 K/uL 4.3  7.0  6.0   Hemoglobin 12.0 - 15.0 g/dL 89.9  8.8  89.4   Hematocrit 36.0 - 46.0 % 30.8  27.7  33.7   Platelets 150 - 400 K/uL 246  284  268  Latest Ref Rng & Units 06/17/2024    3:07 PM 03/25/2024    1:16 PM 02/26/2024   10:13 AM  CMP  Glucose 70 - 99 mg/dL 897  98  894   BUN 8 - 23 mg/dL 11  17  22    Creatinine 0.44 - 1.00 mg/dL 8.85  8.80  8.89   Sodium 135 - 145 mmol/L 133  140  140   Potassium 3.5 - 5.1 mmol/L 4.0  3.9  4.5   Chloride 98 - 111 mmol/L 95  102  103   CO2 22 - 32 mmol/L 26  29  27    Calcium 8.9 - 10.3 mg/dL 9.5  9.9  9.9   Total Protein 6.5 - 8.1 g/dL 7.0  7.4  7.2   Total Bilirubin 0.0 - 1.2 mg/dL 0.5  0.5  0.3   Alkaline Phos 38 - 126 U/L 62  63  61   AST 15 - 41 U/L 26  16  15    ALT 0 - 44 U/L 14  7  8      Latest Reference Range & Units 06/17/24 15:07  Iron  28 - 170 ug/dL 50  UIBC ug/dL 694  TIBC 749 - 549 ug/dL 645  Saturation Ratios 10.4 - 31.8 % 14  Ferritin 11 - 307 ng/mL 119    ASSESSMENT & PLAN:  Assessment/Plan:  An 84 y.o. female with iron  deficiency anemia.  Her hemoglobin of 10 is stable.  Her iron  parameters are also decent today.  I have no problem with her continuing to take an  iron  pill on a daily basis to help with the fortification of her iron  stores.  Moving forward, her CBC will be followed once every 2 months.  I will see her back in 4 months for repeat clinical assessment.  The patient understands all the plans discussed today and is in agreement with them.    Lainey Nelson DELENA Kerns, MD       "

## 2024-06-17 ENCOUNTER — Other Ambulatory Visit: Payer: Self-pay | Admitting: Oncology

## 2024-06-17 ENCOUNTER — Inpatient Hospital Stay

## 2024-06-17 ENCOUNTER — Inpatient Hospital Stay: Attending: Oncology | Admitting: Oncology

## 2024-06-17 ENCOUNTER — Telehealth: Payer: Self-pay | Admitting: Oncology

## 2024-06-17 VITALS — BP 137/105 | HR 70 | Temp 98.3°F | Resp 16 | Ht 62.0 in | Wt 188.6 lb

## 2024-06-17 DIAGNOSIS — D508 Other iron deficiency anemias: Secondary | ICD-10-CM

## 2024-06-17 LAB — CMP (CANCER CENTER ONLY)
ALT: 14 U/L (ref 0–44)
AST: 26 U/L (ref 15–41)
Albumin: 4 g/dL (ref 3.5–5.0)
Alkaline Phosphatase: 62 U/L (ref 38–126)
Anion gap: 11 (ref 5–15)
BUN: 11 mg/dL (ref 8–23)
CO2: 26 mmol/L (ref 22–32)
Calcium: 9.5 mg/dL (ref 8.9–10.3)
Chloride: 95 mmol/L — ABNORMAL LOW (ref 98–111)
Creatinine: 1.14 mg/dL — ABNORMAL HIGH (ref 0.44–1.00)
GFR, Estimated: 48 mL/min — ABNORMAL LOW
Glucose, Bld: 102 mg/dL — ABNORMAL HIGH (ref 70–99)
Potassium: 4 mmol/L (ref 3.5–5.1)
Sodium: 133 mmol/L — ABNORMAL LOW (ref 135–145)
Total Bilirubin: 0.5 mg/dL (ref 0.0–1.2)
Total Protein: 7 g/dL (ref 6.5–8.1)

## 2024-06-17 LAB — CBC WITH DIFFERENTIAL (CANCER CENTER ONLY)
Abs Immature Granulocytes: 0.01 K/uL (ref 0.00–0.07)
Basophils Absolute: 0 K/uL (ref 0.0–0.1)
Basophils Relative: 1 %
Eosinophils Absolute: 0.1 K/uL (ref 0.0–0.5)
Eosinophils Relative: 1 %
HCT: 30.8 % — ABNORMAL LOW (ref 36.0–46.0)
Hemoglobin: 10 g/dL — ABNORMAL LOW (ref 12.0–15.0)
Immature Granulocytes: 0 %
Lymphocytes Relative: 42 %
Lymphs Abs: 1.8 K/uL (ref 0.7–4.0)
MCH: 27.2 pg (ref 26.0–34.0)
MCHC: 32.5 g/dL (ref 30.0–36.0)
MCV: 83.9 fL (ref 80.0–100.0)
Monocytes Absolute: 0.6 K/uL (ref 0.1–1.0)
Monocytes Relative: 14 %
Neutro Abs: 1.8 K/uL (ref 1.7–7.7)
Neutrophils Relative %: 42 %
Platelet Count: 246 K/uL (ref 150–400)
RBC: 3.67 MIL/uL — ABNORMAL LOW (ref 3.87–5.11)
RDW: 13 % (ref 11.5–15.5)
WBC Count: 4.3 K/uL (ref 4.0–10.5)
nRBC: 0 % (ref 0.0–0.2)

## 2024-06-17 LAB — IRON AND TIBC
Iron: 50 ug/dL (ref 28–170)
Saturation Ratios: 14 % (ref 10.4–31.8)
TIBC: 354 ug/dL (ref 250–450)
UIBC: 305 ug/dL

## 2024-06-17 LAB — FERRITIN: Ferritin: 119 ng/mL (ref 11–307)

## 2024-06-17 NOTE — Telephone Encounter (Signed)
 Patient has been scheduled for follow-up visit per 06/17/2024 LOS.  Pt given an appt calendar with date and time.

## 2024-06-18 ENCOUNTER — Telehealth: Payer: Self-pay

## 2024-06-18 NOTE — Telephone Encounter (Signed)
 Per Dr. Ezzard Iron  levels are normal.  Attempted to call patient, no answer.\

## 2024-07-05 ENCOUNTER — Encounter: Payer: Self-pay | Admitting: Oncology

## 2024-08-16 ENCOUNTER — Inpatient Hospital Stay

## 2024-10-15 ENCOUNTER — Inpatient Hospital Stay

## 2024-10-15 ENCOUNTER — Inpatient Hospital Stay: Admitting: Oncology
